# Patient Record
Sex: Female | Born: 1985 | Hispanic: Yes | Marital: Married | Smoking: Never smoker
Health system: Southern US, Community
[De-identification: ages and names within clinical notes are randomized; demographics above are authoritative.]

## PROBLEM LIST (undated history)

## (undated) ENCOUNTER — Inpatient Hospital Stay (HOSPITAL_COMMUNITY): Payer: Self-pay

## (undated) DIAGNOSIS — O139 Gestational [pregnancy-induced] hypertension without significant proteinuria, unspecified trimester: Secondary | ICD-10-CM

## (undated) DIAGNOSIS — N83209 Unspecified ovarian cyst, unspecified side: Secondary | ICD-10-CM

## (undated) DIAGNOSIS — I10 Essential (primary) hypertension: Secondary | ICD-10-CM

## (undated) HISTORY — DX: Gestational (pregnancy-induced) hypertension without significant proteinuria, unspecified trimester: O13.9

## (undated) HISTORY — PX: WISDOM TOOTH EXTRACTION: SHX21

## (undated) HISTORY — PX: CHOLECYSTECTOMY, LAPAROSCOPIC: SHX56

## (undated) HISTORY — PX: NO PAST SURGERIES: SHX2092

---
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
## 2007-05-24 ENCOUNTER — Ambulatory Visit (HOSPITAL_COMMUNITY): Admission: RE | Admit: 2007-05-24 | Discharge: 2007-05-24 | Payer: Self-pay | Admitting: Family Medicine

## 2007-06-13 ENCOUNTER — Ambulatory Visit (HOSPITAL_COMMUNITY): Admission: RE | Admit: 2007-06-13 | Discharge: 2007-06-13 | Payer: Self-pay | Admitting: Obstetrics & Gynecology

## 2007-07-03 ENCOUNTER — Ambulatory Visit (HOSPITAL_COMMUNITY): Admission: RE | Admit: 2007-07-03 | Discharge: 2007-07-03 | Payer: Self-pay | Admitting: Obstetrics & Gynecology

## 2007-07-05 ENCOUNTER — Ambulatory Visit (HOSPITAL_COMMUNITY): Admission: RE | Admit: 2007-07-05 | Discharge: 2007-07-05 | Payer: Self-pay | Admitting: Obstetrics & Gynecology

## 2007-07-15 ENCOUNTER — Ambulatory Visit (HOSPITAL_COMMUNITY): Admission: RE | Admit: 2007-07-15 | Discharge: 2007-07-15 | Payer: Self-pay | Admitting: Obstetrics & Gynecology

## 2007-12-11 ENCOUNTER — Ambulatory Visit: Payer: Self-pay | Admitting: Family Medicine

## 2007-12-11 ENCOUNTER — Inpatient Hospital Stay (HOSPITAL_COMMUNITY): Admission: RE | Admit: 2007-12-11 | Discharge: 2007-12-11 | Payer: Self-pay | Admitting: Family Medicine

## 2007-12-13 ENCOUNTER — Ambulatory Visit: Payer: Self-pay | Admitting: *Deleted

## 2007-12-13 ENCOUNTER — Inpatient Hospital Stay (HOSPITAL_COMMUNITY): Admission: AD | Admit: 2007-12-13 | Discharge: 2007-12-16 | Payer: Self-pay | Admitting: Family Medicine

## 2007-12-14 DIAGNOSIS — O139 Gestational [pregnancy-induced] hypertension without significant proteinuria, unspecified trimester: Secondary | ICD-10-CM

## 2009-07-07 ENCOUNTER — Inpatient Hospital Stay (HOSPITAL_COMMUNITY): Admission: AD | Admit: 2009-07-07 | Discharge: 2009-07-07 | Payer: Self-pay | Admitting: Obstetrics and Gynecology

## 2009-09-15 ENCOUNTER — Ambulatory Visit (HOSPITAL_COMMUNITY): Admission: RE | Admit: 2009-09-15 | Discharge: 2009-09-15 | Payer: Self-pay | Admitting: Obstetrics & Gynecology

## 2009-12-26 ENCOUNTER — Ambulatory Visit: Payer: Self-pay | Admitting: Obstetrics and Gynecology

## 2009-12-26 ENCOUNTER — Inpatient Hospital Stay (HOSPITAL_COMMUNITY): Admission: AD | Admit: 2009-12-26 | Discharge: 2009-12-26 | Payer: Self-pay | Admitting: Obstetrics & Gynecology

## 2010-02-02 ENCOUNTER — Ambulatory Visit: Payer: Self-pay | Admitting: Obstetrics and Gynecology

## 2010-02-02 ENCOUNTER — Inpatient Hospital Stay (HOSPITAL_COMMUNITY): Admission: AD | Admit: 2010-02-02 | Discharge: 2010-02-04 | Payer: Self-pay | Admitting: Obstetrics & Gynecology

## 2010-05-04 ENCOUNTER — Inpatient Hospital Stay (HOSPITAL_COMMUNITY)
Admission: EM | Admit: 2010-05-04 | Discharge: 2010-05-06 | Payer: Self-pay | Source: Home / Self Care | Admitting: Emergency Medicine

## 2010-05-05 ENCOUNTER — Encounter (INDEPENDENT_AMBULATORY_CARE_PROVIDER_SITE_OTHER): Payer: Self-pay

## 2010-09-13 LAB — CBC
Hemoglobin: 13.5 g/dL (ref 12.0–15.0)
MCH: 29.7 pg (ref 26.0–34.0)
MCHC: 35.2 g/dL (ref 30.0–36.0)
MCV: 84.4 fL (ref 78.0–100.0)
MCV: 86 fL (ref 78.0–100.0)
Platelets: 200 10*3/uL (ref 150–400)
RBC: 4.2 MIL/uL (ref 3.87–5.11)
RBC: 4.54 MIL/uL (ref 3.87–5.11)
RDW: 13.7 % (ref 11.5–15.5)
WBC: 10.1 10*3/uL (ref 4.0–10.5)

## 2010-09-13 LAB — COMPREHENSIVE METABOLIC PANEL
BUN: 13 mg/dL (ref 6–23)
CO2: 25 mEq/L (ref 19–32)
Chloride: 109 mEq/L (ref 96–112)
Creatinine, Ser: 0.72 mg/dL (ref 0.4–1.2)
GFR calc non Af Amer: >60 mL/min (ref >60)
Total Bilirubin: 1.8 mg/dL — ABNORMAL HIGH (ref 0.3–1.2)

## 2010-09-13 LAB — URINE MICROSCOPIC-ADD ON

## 2010-09-13 LAB — HEPATIC FUNCTION PANEL
AST: 195 U/L — ABNORMAL HIGH (ref 0–37)
Albumin: 3.2 g/dL — ABNORMAL LOW (ref 3.5–5.2)
Alkaline Phosphatase: 116 U/L (ref 39–117)
Bilirubin, Direct: 0.2 mg/dL (ref 0.0–0.3)
Indirect Bilirubin: 0.7 mg/dL (ref 0.3–0.9)
Total Bilirubin: 0.9 mg/dL (ref 0.3–1.2)
Total Protein: 5.7 g/dL — ABNORMAL LOW (ref 6.0–8.3)

## 2010-09-13 LAB — URINALYSIS, ROUTINE W REFLEX MICROSCOPIC
Bilirubin Urine: NEGATIVE
Nitrite: NEGATIVE
Specific Gravity, Urine: 1.021 (ref 1.005–1.030)
Urobilinogen, UA: 1 mg/dL (ref 0.0–1.0)

## 2010-09-13 LAB — DIFFERENTIAL
Basophils Absolute: 0 10*3/uL (ref 0.0–0.1)
Eosinophils Relative: 1 % (ref 0–5)
Lymphocytes Relative: 17 % (ref 12–46)
Neutro Abs: 7.3 10*3/uL (ref 1.7–7.7)
Neutrophils Relative %: 76 % (ref 43–77)

## 2010-09-13 LAB — LIPASE, BLOOD: Lipase: 38 U/L (ref 11–59)

## 2010-09-13 LAB — POCT PREGNANCY, URINE: Preg Test, Ur: NEGATIVE

## 2010-09-16 LAB — RPR: RPR Ser Ql: NONREACTIVE

## 2010-09-16 LAB — CBC
HCT: 40.9 % (ref 36.0–46.0)
MCH: 30.1 pg (ref 26.0–34.0)
MCHC: 33.6 g/dL (ref 30.0–36.0)
Platelets: 210 10*3/uL (ref 150–400)
RBC: 4.1 MIL/uL (ref 3.87–5.11)
RDW: 13.7 % (ref 11.5–15.5)
WBC: 16.7 10*3/uL — ABNORMAL HIGH (ref 4.0–10.5)

## 2010-09-18 LAB — WET PREP, GENITAL
Clue Cells Wet Prep HPF POC: NONE SEEN
Trich, Wet Prep: NONE SEEN
Yeast Wet Prep HPF POC: NONE SEEN

## 2010-09-18 LAB — URINALYSIS, ROUTINE W REFLEX MICROSCOPIC
Ketones, ur: NEGATIVE mg/dL
Nitrite: NEGATIVE
pH: 6.5 (ref 5.0–8.0)

## 2010-09-18 LAB — POCT PREGNANCY, URINE: Preg Test, Ur: POSITIVE

## 2010-09-18 LAB — URINE MICROSCOPIC-ADD ON

## 2010-11-22 ENCOUNTER — Emergency Department (HOSPITAL_COMMUNITY)
Admission: EM | Admit: 2010-11-22 | Discharge: 2010-11-22 | Disposition: A | Discharge status: Home / Self Care | Payer: Self-pay | Attending: Emergency Medicine | Admitting: Emergency Medicine

## 2010-11-22 DIAGNOSIS — R21 Rash and other nonspecific skin eruption: Secondary | ICD-10-CM | POA: Insufficient documentation

## 2011-03-30 LAB — URINALYSIS, ROUTINE W REFLEX MICROSCOPIC
Bilirubin Urine: NEGATIVE
Ketones, ur: NEGATIVE
Nitrite: NEGATIVE
Protein, ur: NEGATIVE
Specific Gravity, Urine: 1.02
Urobilinogen, UA: 0.2

## 2011-03-30 LAB — CBC
MCHC: 35
Platelets: 217
RDW: 12.9

## 2011-03-30 LAB — RPR: RPR Ser Ql: NONREACTIVE

## 2013-05-09 ENCOUNTER — Inpatient Hospital Stay (HOSPITAL_COMMUNITY)
Admission: AD | Admit: 2013-05-09 | Discharge: 2013-05-09 | Disposition: A | Discharge status: Home / Self Care | Payer: Self-pay | Source: Ambulatory Visit | Attending: Family Medicine | Admitting: Family Medicine

## 2013-05-09 ENCOUNTER — Inpatient Hospital Stay (HOSPITAL_COMMUNITY): Payer: Self-pay

## 2013-05-09 ENCOUNTER — Encounter (HOSPITAL_COMMUNITY): Payer: Self-pay | Admitting: *Deleted

## 2013-05-09 DIAGNOSIS — Z975 Presence of (intrauterine) contraceptive device: Secondary | ICD-10-CM | POA: Insufficient documentation

## 2013-05-09 DIAGNOSIS — N946 Dysmenorrhea, unspecified: Secondary | ICD-10-CM | POA: Insufficient documentation

## 2013-05-09 DIAGNOSIS — R1032 Left lower quadrant pain: Secondary | ICD-10-CM | POA: Insufficient documentation

## 2013-05-09 HISTORY — DX: Unspecified ovarian cyst, unspecified side: N83.209

## 2013-05-09 LAB — URINALYSIS, ROUTINE W REFLEX MICROSCOPIC
Ketones, ur: NEGATIVE mg/dL
Leukocytes, UA: NEGATIVE
Nitrite: NEGATIVE
Protein, ur: NEGATIVE mg/dL

## 2013-05-09 LAB — CBC
MCH: 30.3 pg (ref 26.0–34.0)
MCHC: 36.1 g/dL — ABNORMAL HIGH (ref 30.0–36.0)
MCV: 84.1 fL (ref 78.0–100.0)
Platelets: 220 10*3/uL (ref 150–400)

## 2013-05-09 LAB — WET PREP, GENITAL

## 2013-05-09 LAB — HCG, QUANTITATIVE, PREGNANCY: hCG, Beta Chain, Quant, S: <1 m[IU]/mL (ref <5)

## 2013-05-09 MED ORDER — IBUPROFEN 600 MG PO TABS: 600.0000 mg | ORAL_TABLET | Freq: Four times a day (QID) | ORAL | Status: DC | PRN

## 2013-05-09 NOTE — MAU Provider Note (Signed)
History     CSN: [id number]  Arrival date and time: 05/09/13 4098   First Provider Initiated Contact with Patient 05/09/13 1435      Chief Complaint  Patient presents with  . Possible Pregnancy  . Abdominal Pain  . Vaginal Bleeding   Possible Pregnancy Associated symptoms include abdominal pain. Pertinent negatives include no chest pain, chills, fever, headaches, nausea or vomiting.  Abdominal Pain Pertinent negatives include no constipation, diarrhea, dysuria, fever, frequency, headaches, nausea or vomiting.  Vaginal Bleeding Associated symptoms include abdominal pain. Pertinent negatives include no back pain, chills, constipation, diarrhea, dysuria, fever, frequency, headaches, nausea or vomiting.    Ms. Bradly Bienenstock is a 27yo [id number] who presents for LLQ pain and on/bleeding for the past 2 months.  Ms. Bradly Bienenstock speaks Spanish and an interpreter was used for the medical interview.  [facility] and has not had any bleeding with the device prior to 2 months ago. Ms. Bradly Bienenstock took a pregnancy test at home 2 months ago that was (+), after which the bleeding began.  She took a subsequent home pregnancy test that was (-), and has bleeding on and off since. Her LLQ has worsened, prompting her to seek care. She states that her "left ovary is hurting," but she denies ever having any pain like this before or any previous gynecological issues.  She denies any HA, CP, SOB, N/V, diarrhea, constipation, dysuria, fever, chills, LE swelling or pain.  She denies any current vaginal bleeding, but does report some mild vaginal discharge that is white and creamy without odor.  She is unsure how low the discharge has been present.  She denies any exposure to STDs.   OB History   Grav Para Term Preterm Abortions TAB SAB Ect Mult Living   3 2 2  0 1 0 1 0 0 2      Past Medical History  Diagnosis Date  . Ovarian cyst 2004    in [place]    Past Surgical  History  Procedure Laterality Date  . Cholecystectomy, laparoscopic      Family History  Problem Relation Age of Onset  . Diabetes Maternal Grandmother   . Tuberculosis Maternal Grandfather   . Diabetes Paternal Grandmother   . Diabetes Paternal Grandfather     History  Substance Use Topics  . Smoking status: Never Smoker   . Smokeless tobacco: Never Used  . Alcohol Use: No    Allergies: No Known Allergies  No prescriptions prior to admission    Review of Systems  Constitutional: Negative for fever and chills.  Eyes: Negative for blurred vision.  Respiratory: Negative for shortness of breath.   Cardiovascular: Negative for chest pain and leg swelling.  Gastrointestinal: Positive for abdominal pain. Negative for heartburn, nausea, vomiting, diarrhea and constipation.  Genitourinary: Positive for vaginal bleeding. Negative for dysuria and frequency.  Musculoskeletal: Negative for back pain.  Neurological: Negative for headaches.   Physical Exam   Blood pressure 115/70, pulse 76, temperature 98.5 F (36.9 C), temperature source Oral, resp. rate 16, height 4\' 11"  (1.499 m), weight 76.839 kg (169 lb 6.4 oz), SpO2 100.00%.  Physical Exam  Constitutional: She is oriented to person, place, and time. She appears well-developed and well-nourished. No distress.  HENT:  Head: Normocephalic and atraumatic.  Eyes: EOM are normal.  Cardiovascular: Normal rate, regular rhythm, normal heart sounds and intact distal pulses.  Exam reveals no gallop and no friction rub.   No  murmur heard. Respiratory: Effort normal and breath sounds normal. She has no wheezes.  GI: Soft. Bowel sounds are normal. She exhibits no distension and no mass. There is tenderness. There is no rebound and no guarding.  Mild LLQ tenderness to palpation  Genitourinary: Vaginal discharge found.  Pelvic exam: VULVA: normal appearing vulva with no masses, tenderness or lesions, VAGINA: vaginal discharge - white,  creamy and malodorous, CERVIX: cervical discharge present - white, creamy and malodorous, DNA probe for chlamydia and GC obtained, cervical motion tenderness absent, multiparous os, mirena strings present, UTERUS: uterus is normal size, shape, consistency and nontender, ADNEXA: tenderness left, no masses, WET MOUNT done    Musculoskeletal: Normal range of motion.  Neurological: She is alert and oriented to person, place, and time.  Skin: Skin is warm.  Psychiatric: She has a normal mood and affect.   Results for orders placed during the hospital encounter of 05/09/13 (from the past 24 hour(s))  URINALYSIS, ROUTINE W REFLEX MICROSCOPIC     Status: None   Collection Time    05/09/13 10:25 AM      Result Value Range   Color, Urine YELLOW  YELLOW   APPearance CLEAR  CLEAR   Specific Gravity, Urine 1.020  1.005 - 1.030   pH 6.0  5.0 - 8.0   Glucose, UA NEGATIVE  NEGATIVE mg/dL   Hgb urine dipstick NEGATIVE  NEGATIVE   Bilirubin Urine NEGATIVE  NEGATIVE   Ketones, ur NEGATIVE  NEGATIVE mg/dL   Protein, ur NEGATIVE  NEGATIVE mg/dL   Urobilinogen, UA 0.2  0.0 - 1.0 mg/dL   Nitrite NEGATIVE  NEGATIVE   Leukocytes, UA NEGATIVE  NEGATIVE  POCT PREGNANCY, URINE     Status: None   Collection Time    05/09/13 10:32 AM      Result Value Range   Preg Test, Ur NEGATIVE  NEGATIVE  CBC     Status: Abnormal   Collection Time    05/09/13 12:26 PM      Result Value Range   WBC 8.4  4.0 - 10.5 K/uL   RBC 4.78  3.87 - 5.11 MIL/uL   Hemoglobin 14.5  12.0 - 15.0 g/dL   HCT 60.4  54.0 - 98.1 %   MCV 84.1  78.0 - 100.0 fL   MCH 30.3  26.0 - 34.0 pg   MCHC 36.1 (*) 30.0 - 36.0 g/dL   RDW 19.1  47.8 - 29.5 %   Platelets 220  150 - 400 K/uL  HCG, QUANTITATIVE, PREGNANCY     Status: None   Collection Time    05/09/13 12:26 PM      Result Value Range   hCG, Beta Chain, Quant, S <1  <5 mIU/mL    MAU Course  Procedures  MDM -UA, UPT (-) -Spanish interpreter called -H&P; interpreter present -CBC,  hCG quant  -Spanish interpreter called  -Vaginal exam with speculum: wet prep, GC/C collected; Spanish interpreter present -Pelvic and transvaginal U/S   US Transvaginal Non-ob  05/09/2013   CLINICAL DATA:  Left lower quadrant pain  EXAM: TRANSABDOMINAL AND TRANSVAGINAL ULTRASOUND OF PELVIS  TECHNIQUE: Both transabdominal and transvaginal ultrasound examinations of the pelvis were performed. Transabdominal technique was performed for global imaging of the pelvis including uterus, ovaries, adnexal regions, and pelvic cul-de-sac. It was necessary to proceed with endovaginal exam following the transabdominal exam to visualize the endometrium and right ovary.  COMPARISON:  None  FINDINGS: Uterus  Measurements: 7.5 x 3.7 x 4.7 cm. No  fibroids or other mass visualized.  Endometrium  IUD is identified within the uterine cavity.  Right ovary  Measurements: 3.3 x 2.2 x 2.3 cm. Normal appearance/no adnexal mass.  Left ovary  Measurements: 3.8 x 2.0 x 2.2 cm. Normal appearance/no adnexal mass.  Other findings  No free fluid.  IMPRESSION: 1. Normal pelvic sonogram.   Electronically Signed   By: Signa Kell M.D.   On: 05/09/2013 19:12   US Pelvis Complete  05/09/2013   CLINICAL DATA:  Left lower quadrant pain  EXAM: TRANSABDOMINAL AND TRANSVAGINAL ULTRASOUND OF PELVIS  TECHNIQUE: Both transabdominal and transvaginal ultrasound examinations of the pelvis were performed. Transabdominal technique was performed for global imaging of the pelvis including uterus, ovaries, adnexal regions, and pelvic cul-de-sac. It was necessary to proceed with endovaginal exam following the transabdominal exam to visualize the endometrium and right ovary.  COMPARISON:  None  FINDINGS: Uterus  Measurements: 7.5 x 3.7 x 4.7 cm. No fibroids or other mass visualized.  Endometrium  IUD is identified within the uterine cavity.  Right ovary  Measurements: 3.3 x 2.2 x 2.3 cm. Normal appearance/no adnexal mass.  Left ovary  Measurements: 3.8 x 2.0  x 2.2 cm. Normal appearance/no adnexal mass.  Other findings  No free fluid.  IMPRESSION: 1. Normal pelvic sonogram.   Electronically Signed   By: Signa Kell M.D.   On: 05/09/2013 19:12   Assessment and Plan  A: 1. Dysmenorrhea    P: D/C home Discussed negative pregnancy and quant results.  Discussed normal [facility] Spanish translator used for all communication.  Ibuprofen 600 mg PO Q 6 hours PRN Increase PO fluids [facility] Return to [facility]U as needed  Christiana Fuchs 05/09/2013, 4:52 PM   I have seen this patient and agree with the above PA student's note.  LEFTWICH-KIRBY, Dariel Pellecchia Certified Nurse-Midwife

## 2013-05-09 NOTE — MAU Note (Signed)
Pain on left side off and on for 2 months. Crampy.  No bleeding right now.  2 months ago had positive preg test., then she started bleeding.  Did a 2nd test after the bleeding it was neg.  Has had bleeding off and on.

## 2013-05-09 NOTE — MAU Note (Signed)
Patient states she has a Mirena IUD for 5 years. States she had a positive home pregnancy test about 2 weeks ago. Has had bright red bleeding off and on for about 2 weeks, only spotting today. Has had lower abdominal pain off and on for 2 weeks.

## 2013-05-10 LAB — GC/CHLAMYDIA PROBE AMP: GC Probe RNA: NEGATIVE

## 2013-05-10 NOTE — MAU Provider Note (Signed)
Chart reviewed and agree with management and plan.  

## 2014-05-04 ENCOUNTER — Encounter (HOSPITAL_COMMUNITY): Payer: Self-pay | Admitting: *Deleted

## 2014-09-28 ENCOUNTER — Encounter (HOSPITAL_COMMUNITY): Payer: Self-pay | Admitting: *Deleted

## 2014-09-28 ENCOUNTER — Inpatient Hospital Stay (HOSPITAL_COMMUNITY)
Admission: AD | Admit: 2014-09-28 | Discharge: 2014-09-29 | Disposition: A | Discharge status: Home / Self Care | Payer: Self-pay | Source: Ambulatory Visit | Attending: Obstetrics & Gynecology | Admitting: Obstetrics & Gynecology

## 2014-09-28 DIAGNOSIS — Z3A01 Less than 8 weeks gestation of pregnancy: Secondary | ICD-10-CM | POA: Insufficient documentation

## 2014-09-28 DIAGNOSIS — O9989 Other specified diseases and conditions complicating pregnancy, childbirth and the puerperium: Secondary | ICD-10-CM | POA: Insufficient documentation

## 2014-09-28 DIAGNOSIS — M545 Low back pain: Secondary | ICD-10-CM | POA: Insufficient documentation

## 2014-09-28 DIAGNOSIS — O4691 Antepartum hemorrhage, unspecified, first trimester: Secondary | ICD-10-CM | POA: Insufficient documentation

## 2014-09-28 DIAGNOSIS — O209 Hemorrhage in early pregnancy, unspecified: Secondary | ICD-10-CM

## 2014-09-28 LAB — POCT PREGNANCY, URINE: Preg Test, Ur: POSITIVE — AB

## 2014-09-28 NOTE — MAU Note (Signed)
Pt states that bleeding only occurs when wiping after BR. Pt states that she did not wear a pad in. Pt states that bleeding may also have looked " tissue-like". Pt is also having lower backache that is constant.

## 2014-09-28 NOTE — MAU Note (Signed)
[facility], and today she has been having bleeding and back pain.

## 2014-09-29 ENCOUNTER — Inpatient Hospital Stay (HOSPITAL_COMMUNITY): Payer: Self-pay

## 2014-09-29 ENCOUNTER — Inpatient Hospital Stay (HOSPITAL_COMMUNITY)
Admission: AD | Admit: 2014-09-29 | Discharge: 2014-09-29 | Disposition: A | Discharge status: Home / Self Care | Payer: Self-pay | Source: Ambulatory Visit | Attending: Family Medicine | Admitting: Family Medicine

## 2014-09-29 ENCOUNTER — Encounter (HOSPITAL_COMMUNITY): Payer: Self-pay | Admitting: *Deleted

## 2014-09-29 DIAGNOSIS — R1030 Lower abdominal pain, unspecified: Secondary | ICD-10-CM | POA: Insufficient documentation

## 2014-09-29 DIAGNOSIS — Z3A08 8 weeks gestation of pregnancy: Secondary | ICD-10-CM | POA: Insufficient documentation

## 2014-09-29 DIAGNOSIS — O9989 Other specified diseases and conditions complicating pregnancy, childbirth and the puerperium: Secondary | ICD-10-CM | POA: Insufficient documentation

## 2014-09-29 DIAGNOSIS — O209 Hemorrhage in early pregnancy, unspecified: Secondary | ICD-10-CM | POA: Insufficient documentation

## 2014-09-29 DIAGNOSIS — O200 Threatened abortion: Secondary | ICD-10-CM

## 2014-09-29 LAB — URINALYSIS, ROUTINE W REFLEX MICROSCOPIC
Bilirubin Urine: NEGATIVE
GLUCOSE, UA: NEGATIVE mg/dL
Ketones, ur: NEGATIVE mg/dL
Leukocytes, UA: NEGATIVE
NITRITE: NEGATIVE
PH: 6.5 (ref 5.0–8.0)
Protein, ur: NEGATIVE mg/dL
SPECIFIC GRAVITY, URINE: 1.015 (ref 1.005–1.030)
Urobilinogen, UA: 0.2 mg/dL (ref 0.0–1.0)

## 2014-09-29 LAB — CBC WITH DIFFERENTIAL/PLATELET
BASOS ABS: 0 10*3/uL (ref 0.0–0.1)
BASOS PCT: 0 % (ref 0–1)
Basophils Absolute: 0 10*3/uL (ref 0.0–0.1)
Basophils Relative: 0 % (ref 0–1)
EOS ABS: 0.2 10*3/uL (ref 0.0–0.7)
Eosinophils Absolute: 0.3 10*3/uL (ref 0.0–0.7)
Eosinophils Relative: 3 % (ref 0–5)
Eosinophils Relative: 4 % (ref 0–5)
HCT: 38 % (ref 36.0–46.0)
HCT: 39.2 % (ref 36.0–46.0)
HEMOGLOBIN: 13.9 g/dL (ref 12.0–15.0)
HEMOGLOBIN: 13.4 g/dL (ref 12.0–15.0)
Lymphocytes Relative: 24 % (ref 12–46)
Lymphocytes Relative: 37 % (ref 12–46)
Lymphs Abs: 1.7 10*3/uL (ref 0.7–4.0)
Lymphs Abs: 2.8 10*3/uL (ref 0.7–4.0)
MCH: 30.1 pg (ref 26.0–34.0)
MCH: 30.3 pg (ref 26.0–34.0)
MCHC: 35.5 g/dL (ref 30.0–36.0)
MCHC: 35.3 g/dL (ref 30.0–36.0)
MCV: 85.4 fL (ref 78.0–100.0)
MCV: 85.6 fL (ref 78.0–100.0)
MONO ABS: 0.4 10*3/uL (ref 0.1–1.0)
MONO ABS: 0.5 10*3/uL (ref 0.1–1.0)
Monocytes Relative: 6 % (ref 3–12)
Monocytes Relative: 6 % (ref 3–12)
Neutro Abs: 3.8 10*3/uL (ref 1.7–7.7)
Neutro Abs: 4.6 10*3/uL (ref 1.7–7.7)
Neutrophils Relative %: 52 % (ref 43–77)
Neutrophils Relative %: 66 % (ref 43–77)
Platelets: 221 10*3/uL (ref 150–400)
Platelets: 225 10*3/uL (ref 150–400)
RBC: 4.45 MIL/uL (ref 3.87–5.11)
RBC: 4.58 MIL/uL (ref 3.87–5.11)
RDW: 13.3 % (ref 11.5–15.5)
RDW: 13.3 % (ref 11.5–15.5)
WBC: 6.9 10*3/uL (ref 4.0–10.5)
WBC: 7.4 10*3/uL (ref 4.0–10.5)

## 2014-09-29 LAB — WET PREP, GENITAL
Clue Cells Wet Prep HPF POC: NONE SEEN
Trich, Wet Prep: NONE SEEN
Yeast Wet Prep HPF POC: NONE SEEN

## 2014-09-29 LAB — URINE MICROSCOPIC-ADD ON

## 2014-09-29 LAB — ABO/RH: ABO/RH(D): O POS

## 2014-09-29 LAB — HIV ANTIBODY (ROUTINE TESTING W REFLEX): HIV SCREEN 4TH GENERATION: NONREACTIVE

## 2014-09-29 LAB — RPR: RPR Ser Ql: NONREACTIVE

## 2014-09-29 LAB — HCG, QUANTITATIVE, PREGNANCY: hCG, Beta Chain, Quant, S: 1122 m[IU]/mL — ABNORMAL HIGH (ref <5)

## 2014-09-29 MED ORDER — ACETAMINOPHEN 500 MG PO TABS
1000.0000 mg | ORAL_TABLET | Freq: Once | ORAL | Status: AC
  Administered 2014-09-29: 1000 mg via ORAL
  Filled 2014-09-29: qty 2

## 2014-09-29 NOTE — MAU Provider Note (Signed)
CSN: [id number]     Arrival date & time 09/28/14  2240 History   None    No chief complaint on file.    (Consider location/radiation/quality/duration/timing/severity/associated sxs/prior Treatment) Patient is a 29 y.o. female presenting with vaginal bleeding. The history is provided by the patient. The history is limited by a language barrier. A language interpreter was used.  Vaginal Bleeding This is a new problem. The current episode started today. The problem occurs constantly. The problem has been unchanged. Pertinent negatives include no abdominal pain, chills, fever, nausea or vomiting. Nothing aggravates the symptoms. She has tried nothing for the symptoms.   Pamela Alvarado is a 29 y.o. [id number] @ 5252w5d gestation by LMP. [facility]/14.   Today she noted blood when she went to the bathroom and wiped. She has not use a pad. She also reports lower back pain since the bleeding started. She denies abdominal pain.   Past Medical History  Diagnosis Date  . Ovarian cyst 2004    in [place]  . Medical history non-contributory    Past Surgical History  Procedure Laterality Date  . Cholecystectomy, laparoscopic    . No past surgeries     Family History  Problem Relation Age of Onset  . Diabetes Maternal Grandmother   . Tuberculosis Maternal Grandfather   . Diabetes Paternal Grandmother   . Diabetes Paternal Grandfather    History  Substance Use Topics  . Smoking status: Never Smoker   . Smokeless tobacco: Never Used  . Alcohol Use: No   OB History    Gravida Para Term Preterm AB TAB SAB Ectopic Multiple Living   4 2 2  0 1 0 1 0 0 2     Review of Systems  Constitutional: Negative for fever and chills.  Gastrointestinal: Negative for nausea, vomiting and abdominal pain.  Genitourinary: Positive for vaginal bleeding.  all other systems negataive    Allergies  Review of patient's allergies indicates no known  allergies.  Home Medications   Prior to Admission medications   Medication Sig Start Date End Date Taking? Authorizing Provider  Prenatal Vit-Fe Fumarate-FA (MULTIVITAMIN-PRENATAL) 27-0.8 MG TABS tablet Take 1 tablet by mouth daily at 12 noon.   Yes Historical Provider, MD   BP 103/56 mmHg  Pulse 68  Temp(Src) 98.5 F (36.9 C) (Oral)  Resp 18  Ht 4\' 11"  (1.499 m)  Wt 180 lb (81.647 kg)  BMI 36.34 kg/m2  SpO2 100%  LMP 07/30/2014 Physical Exam  Constitutional: She is oriented to person, place, and time. She appears well-developed and well-nourished.  HENT:  Head: Normocephalic.  Eyes: EOM are normal.  Neck: Normal range of motion. Neck supple.  Cardiovascular: Normal rate.   Pulmonary/Chest: Effort normal.  Abdominal: Soft. There is no tenderness.  Genitourinary:  External genitalia without lesions, small blood vaginal vault, cervix closed, no CMT, no adnexal tenderness. Uterus without palpable enlargement.   Musculoskeletal: Normal range of motion.  Neurological: She is alert and oriented to person, place, and time. No cranial nerve deficit.  Skin: Skin is warm and dry.  Psychiatric: She has a normal mood and affect. Her behavior is normal.  Nursing note and vitals reviewed.   ED Course  Procedures (including critical care time) Labs, exam, ultrasound Results for orders placed or performed during the hospital encounter of 09/28/14 (from the past 48 hour(s))  Urinalysis, Routine w reflex microscopic     Status: Abnormal   Collection Time:  09/28/14 11:16 PM  Result Value Ref Range   Color, Urine YELLOW YELLOW   APPearance CLEAR CLEAR   Specific Gravity, Urine 1.015 1.005 - 1.030   pH 6.5 5.0 - 8.0   Glucose, UA NEGATIVE NEGATIVE mg/dL   Hgb urine dipstick LARGE (A) NEGATIVE   Bilirubin Urine NEGATIVE NEGATIVE   Ketones, ur NEGATIVE NEGATIVE mg/dL   Protein, ur NEGATIVE NEGATIVE mg/dL   Urobilinogen, UA 0.2 0.0 - 1.0 mg/dL   Nitrite NEGATIVE NEGATIVE   Leukocytes,  UA NEGATIVE NEGATIVE  Urine microscopic-add on     Status: None   Collection Time: 09/28/14 11:16 PM  Result Value Ref Range   Squamous Epithelial / LPF RARE RARE   WBC, UA 0-2 <3 WBC/hpf   RBC / HPF 7-10 <3 RBC/hpf   Bacteria, UA RARE RARE  CBC with Differential/Platelet     Status: None   Collection Time: 09/28/14 11:35 PM  Result Value Ref Range   WBC 7.4 4.0 - 10.5 K/uL   RBC 4.45 3.87 - 5.11 MIL/uL   Hemoglobin 13.4 12.0 - 15.0 g/dL   HCT 40.9 81.1 - 91.4 %   MCV 85.4 78.0 - 100.0 fL   MCH 30.1 26.0 - 34.0 pg   MCHC 35.3 30.0 - 36.0 g/dL   RDW 78.2 95.6 - 21.3 %   Platelets 221 150 - 400 K/uL   Neutrophils Relative % 52 43 - 77 %   Neutro Abs 3.8 1.7 - 7.7 K/uL   Lymphocytes Relative 37 12 - 46 %   Lymphs Abs 2.8 0.7 - 4.0 K/uL   Monocytes Relative 6 3 - 12 %   Monocytes Absolute 0.5 0.1 - 1.0 K/uL   Eosinophils Relative 4 0 - 5 %   Eosinophils Absolute 0.3 0.0 - 0.7 K/uL   Basophils Relative 0 0 - 1 %   Basophils Absolute 0.0 0.0 - 0.1 K/uL  hCG, quantitative, pregnancy     Status: Abnormal   Collection Time: 09/28/14 11:35 PM  Result Value Ref Range   hCG, Beta Chain, Quant, S 1122 (H) <5 mIU/mL    Comment:          GEST. AGE      CONC.  (mIU/mL)   <=1 WEEK        5 - 50     2 WEEKS       50 - 500     3 WEEKS       100 - 10,000     4 WEEKS     1,000 - 30,000     5 WEEKS     3,500 - 115,000   6-8 WEEKS     12,000 - 270,000    12 WEEKS     15,000 - 220,000        FEMALE AND NON-PREGNANT FEMALE:     LESS THAN 5 mIU/mL   ABO/Rh     Status: None   Collection Time: 09/28/14 11:35 PM  Result Value Ref Range   ABO/RH(D) O POS   RPR     Status: None   Collection Time: 09/28/14 11:35 PM  Result Value Ref Range   RPR Ser Ql Non Reactive Non Reactive    Comment: (NOTE) Performed At: [facility] [address] [place], [place] [id number] Mila Homer MD [id number]   HIV antibody     Status: None   Collection Time: 09/28/14 11:35 PM  Result Value  Ref Range   HIV Screen 4th  Generation wRfx Non Reactive Non Reactive    Comment: (NOTE) Performed At: [facility] [address] [place], [place] [id number] Mila Homer MD WU:[id number]   Pregnancy, urine POC     Status: Abnormal   Collection Time: 09/28/14 11:45 PM  Result Value Ref Range   Preg Test, Ur POSITIVE (A) NEGATIVE    Comment:        THE SENSITIVITY OF THIS METHODOLOGY IS >24 mIU/mL   Wet prep, genital     Status: Abnormal   Collection Time: 09/29/14 12:10 AM  Result Value Ref Range   Yeast Wet Prep HPF POC NONE SEEN NONE SEEN   Trich, Wet Prep NONE SEEN NONE SEEN   Clue Cells Wet Prep HPF POC NONE SEEN NONE SEEN   WBC, Wet Prep HPF POC FEW (A) NONE SEEN    Comment: FEW BACTERIA SEEN     Labs Review No results found for this or any previous visit (from the past 24 hour(s)).  US Ob Comp Less 14 Wks  09/29/2014   CLINICAL DATA:  Acute onset of vaginal bleeding and pelvic cramping. Initial encounter.  EXAM: OBSTETRIC <14 WK [place] AND TRANSVAGINAL OB US  TECHNIQUE: Both transabdominal and transvaginal ultrasound examinations were performed for complete evaluation of the gestation as well as the maternal uterus, adnexal regions, and pelvic cul-de-sac. Transvaginal technique was performed to assess early pregnancy.  COMPARISON:  Pelvic ultrasound performed 05/09/2013  FINDINGS: Intrauterine gestational sac: Visualized/normal in shape.  Yolk sac:  No  Embryo:  No  Cardiac Activity: N/A  MSD: 9.9 mm   5 w   5  d  Maternal uterus/adnexae: No subchorionic hemorrhage is noted. The uterus is unremarkable in appearance.  The ovaries are within normal limits. The right ovary measures 3.4 x 1.9 x 2.8 cm, while the left ovary measures 3.2 x 2.2 x 2.1 cm. No suspicious adnexal masses are seen; there is no evidence for ovarian torsion.  No free fluid is seen within the pelvic cul-de-sac.  IMPRESSION: Single intrauterine gestational sac noted, with a mean sac diameter of 1.0 cm,  corresponding to a gestational age of [redacted] weeks 5 days. This does not match the gestational age by LMP, though it remains too early to establish an estimated date of delivery. No yolk sac or embryo is yet seen, within normal limits. If the patient's quantitative beta HCG continues to trend upward, follow-up pelvic ultrasound could be considered in 2 weeks to assess for an embryo.   Electronically Signed   By: Roanna Raider M.D.   On: 09/29/2014 02:01   US Ob Transvaginal  09/29/2014   CLINICAL DATA:  Acute onset of vaginal bleeding and pelvic cramping. Initial encounter.  EXAM: OBSTETRIC <14 WK [place] AND TRANSVAGINAL OB US  TECHNIQUE: Both transabdominal and transvaginal ultrasound examinations were performed for complete evaluation of the gestation as well as the maternal uterus, adnexal regions, and pelvic cul-de-sac. Transvaginal technique was performed to assess early pregnancy.  COMPARISON:  Pelvic ultrasound performed 05/09/2013  FINDINGS: Intrauterine gestational sac: Visualized/normal in shape.  Yolk sac:  No  Embryo:  No  Cardiac Activity: N/A  MSD: 9.9 mm   5 w   5  d  Maternal uterus/adnexae: No subchorionic hemorrhage is noted. The uterus is unremarkable in appearance.  The ovaries are within normal limits. The right ovary measures 3.4 x 1.9 x 2.8 cm, while the left ovary measures 3.2 x 2.2 x 2.1 cm. No suspicious adnexal masses are seen; there  is no evidence for ovarian torsion.  No free fluid is seen within the pelvic cul-de-sac.  IMPRESSION: Single intrauterine gestational sac noted, with a mean sac diameter of 1.0 cm, corresponding to a gestational age of [redacted] weeks 5 days. This does not match the gestational age by LMP, though it remains too early to establish an estimated date of delivery. No yolk sac or embryo is yet seen, within normal limits. If the patient's quantitative beta HCG continues to trend upward, follow-up pelvic ultrasound could be considered in 2 weeks to assess for an embryo.    Electronically Signed   By: Roanna Raider M.D.   On: 09/29/2014 02:01     MDM  29 y.o. female with bleeding in first trimester pregnancy. Stable for d/c without hemorrhage or anemia. Patient would be 8 weeks 2 days gestation by LMP, however, Bhcg only 1122 and ultrasound measures 5 week 5 day IUGS without YS.  Will have patient to return in 2 days for f/u Bhcg or sooner for problems. Instructions on bleeding in early pregnancy.  Discussed with the patient using the Spanish translator clinical, lab and ultrasound findings and plan of care. All questioned fully answered. She will return in 2 days or sooner for problems.    Final diagnoses:  Vaginal bleeding in pregnancy, first trimester

## 2014-09-29 NOTE — MAU Note (Signed)
Increase in pain and bleeding

## 2014-09-29 NOTE — Discharge Instructions (Signed)
Amenaza de aborto °(Threatened Miscarriage) °La amenaza de aborto se produce cuando hay hemorragia vaginal durante las primeras 20 semanas de embarazo, pero el embarazo no se interrumpe. El médico le hará pruebas para asegurarse de que el embarazo continúe. La causa de la hemorragia puede ser desconocida. Este trastorno no significa que el embarazo terminará. Sin embargo, aumenta el riesgo de que el embarazo se interrumpa (aborto completo). °CUIDADOS EN EL HOGAR  °· Asegúrese de asistir a todas las citas de cuidados prenatales con el médico. °· Descanse lo suficiente. °· No tenga relaciones sexuales ni use tampones si tiene hemorragia vaginal. °· No se haga duchas vaginales. °· No fume ni consuma drogas. °· No beba alcohol. °· Evite la cafeína. °SOLICITE AYUDA SI: °· Tiene una hemorragia leve de la vagina. °· Tiene dolor o cólicos abdominales. °· Tiene fiebre. °SOLICITE AYUDA DE INMEDIATO SI:  °· Tiene hemorragia abundante de la vagina. °· Elimina coágulos de sangre por la vagina. °· Tiene mucho dolor en el abdomen o la parte baja de la espalda, cólicos abdominales o calambres en la parte baja de la espalda. °· Tiene fiebre, escalofríos y mucho dolor abdominal. °ASEGÚRESE DE QUE:  °· Comprende estas instrucciones. °· Controlará su afección. °· Recibirá ayuda de inmediato si no mejora o si empeora. °Document Released: 07/22/2010 Document Revised: 06/24/2013 °[employer]® Patient Information ©2015 [employer]. This information is not intended to replace advice given to you by your health care provider. Make sure you discuss any questions you have with your health care provider. ° °

## 2014-09-29 NOTE — Discharge Instructions (Signed)
Return here in 2 days to repeat the pregnancy hormone level. Return sooner for problems.

## 2014-09-29 NOTE — MAU Note (Signed)
Pt seen last night with cramping and bleeding; pt returned due to increased pain and bleeding today; rates pain @ 7; having moderate bleeding; had u/s last night;

## 2014-09-29 NOTE — MAU Provider Note (Signed)
History     CSN: [id number]  Arrival date and time: 09/29/14 4098   First Provider Initiated Contact with Patient 09/29/14 1017      Chief Complaint  Patient presents with  . Vaginal Bleeding  . Abdominal Pain   HPI  Pamela Alvarado is a 29 y.o. [phone] at 108w5d who presents to MAU today with complaint of worsening lower abdominal pain and vaginal bleeding. The patient was seen earlier today in MAU and had [place] that showed a small IUGS without YS or FP. Quant hCG was 1122. She states that after leaving she had an increase in pain and bleeding that has now started to improve. She rates pain at 5/10 now. She states pain comes and goes. She denies any clots since prior to previous visit. The patient endorses fatigue, but denies weakness or dizziness. She has not taken any pain medication. She states that she required 2 pads in 1 hour at home, but then bleeding slowed significantly.   OB History    Gravida Para Term Preterm AB TAB SAB Ectopic Multiple Living   0 1 0 1 0 0 2      Past Medical History  Diagnosis Date  . Ovarian cyst 2004    in [place]  . Medical history non-contributory     Past Surgical History  Procedure Laterality Date  . Cholecystectomy, laparoscopic    . No past surgeries      Family History  Problem Relation Age of Onset  . Diabetes Maternal Grandmother   . Tuberculosis Maternal Grandfather   . Diabetes Paternal Grandmother   . Diabetes Paternal Grandfather     History  Substance Use Topics  . Smoking status: Never Smoker   . Smokeless tobacco: Never Used  . Alcohol Use: No    Allergies: No Known Allergies  Prescriptions prior to admission  Medication Sig Dispense Refill Last Dose  . Prenatal Vit-Fe Fumarate-FA (MULTIVITAMIN-PRENATAL) 27-0.8 MG TABS tablet Take 1 tablet by mouth daily at 12 noon.       Review of Systems  Constitutional: Positive for malaise/fatigue. Negative for fever.  Gastrointestinal: Positive for  abdominal pain.  Genitourinary:       + vaginal bleeding  Neurological: Negative for dizziness, loss of consciousness and weakness.   Physical Exam   Blood pressure 120/61, pulse 87, temperature 98.5 F (36.9 C), temperature source Oral, resp. rate 18, last menstrual period 07/30/2014.  Physical Exam  Constitutional: She is oriented to person, place, and time. She appears well-developed and well-nourished. No distress.  HENT:  Head: Normocephalic.  Cardiovascular: Normal rate.   Respiratory: Effort normal.  GI: Soft. She exhibits no distension and no mass. There is no tenderness. There is no rebound and no guarding.  Genitourinary:  Very small amount of blood on pad. While in MAU patient passed a small clot in the bathroom. Bleeding has been stable.   Neurological: She is alert and oriented to person, place, and time.  Skin: Skin is warm and dry. No erythema.  Psychiatric: She has a normal mood and affect.   Results for orders placed or performed during the hospital encounter of 09/29/14 (from the past 24 hour(s))  CBC with Differential/Platelet     Status: None   Collection Time: 09/29/14 10:45 AM  Result Value Ref Range   WBC 6.9 4.0 - 10.5 K/uL   RBC 4.58 3.87 - 5.11 MIL/uL   Hemoglobin 13.9 12.0 - 15.0 g/dL   HCT 29.5 62.1 -  46.0 %   MCV 85.6 78.0 - 100.0 fL   MCH 30.3 26.0 - 34.0 pg   MCHC 35.5 30.0 - 36.0 g/dL   RDW 16.113.3 09.611.5 - 04.515.5 %   Platelets 225 150 - 400 K/uL   Neutrophils Relative % 66 43 - 77 %   Neutro Abs 4.6 1.7 - 7.7 K/uL   Lymphocytes Relative 24 12 - 46 %   Lymphs Abs 1.7 0.7 - 4.0 K/uL   Monocytes Relative 6 3 - 12 %   Monocytes Absolute 0.4 0.1 - 1.0 K/uL   Eosinophils Relative 3 0 - 5 %   Eosinophils Absolute 0.2 0.0 - 0.7 K/uL   Basophils Relative 0 0 - 1 %   Basophils Absolute 0.0 0.0 - 0.1 K/uL    MAU Course  Procedures None  MDM CBC today Patient is hemodynamically stable. Bleeding and pain are improving.  Due to amount of bleeding and  dating discrepancies patient advised about concern for SAB. Will need to wait 48 hours for next hCG level to confirm.  Assessment and Plan  A: IUGS without YS or FP Vaginal bleeding in pregnancy  P: Discharge home Bleeding precautions discussed Patient advised to take Tylenol PRN for pain Patient to return to MAU in 48 hours for follow-up labs or sooner if symptoms worsen  Marny LowensteinJulie N Enijah Furr, PA-C  09/29/2014, 12:17 PM

## 2014-09-30 LAB — GC/CHLAMYDIA PROBE AMP (~~LOC~~) NOT AT ARMC
Chlamydia: NEGATIVE
NEISSERIA GONORRHEA: NEGATIVE

## 2014-10-01 ENCOUNTER — Inpatient Hospital Stay (HOSPITAL_COMMUNITY)
Admission: AD | Admit: 2014-10-01 | Discharge: 2014-10-01 | Disposition: A | Discharge status: Home / Self Care | Payer: Self-pay | Source: Ambulatory Visit | Attending: Obstetrics & Gynecology | Admitting: Obstetrics & Gynecology

## 2014-10-01 DIAGNOSIS — O020 Blighted ovum and nonhydatidiform mole: Secondary | ICD-10-CM | POA: Insufficient documentation

## 2014-10-01 LAB — HCG, QUANTITATIVE, PREGNANCY: hCG, Beta Chain, Quant, S: 88 m[IU]/mL — ABNORMAL HIGH (ref <5)

## 2014-10-01 NOTE — MAU Provider Note (Signed)
History     CSN: [id number]  Arrival date and time: 10/01/14 1224   None     Chief Complaint  Patient presents with  . Follow-up   HPI Pt here for f/u HCG Seen on 09/28/2014 for bleeding in pregnancy HCG 1122 US showed IUGS 9.9 mm- no YS, no embryo- no suspicious adnexal masses Pt is having a small amount of bleeding today Pt has googled this and is aware she has probably had a miscarriage RN note: RN Registered Nurse Signed  MAU Note 10/01/2014 12:58 PM    Expand All Collapse All   Pt here for F/U BHCG. Pt states she is continuing to have bleeding but less than before. Denies pain.       Past Medical History  Diagnosis Date  . Ovarian cyst 2004    in [place]  . Medical history non-contributory     Past Surgical History  Procedure Laterality Date  . Cholecystectomy, laparoscopic    . No past surgeries      Family History  Problem Relation Age of Onset  . Diabetes Maternal Grandmother   . Tuberculosis Maternal Grandfather   . Diabetes Paternal Grandmother   . Diabetes Paternal Grandfather     History  Substance Use Topics  . Smoking status: Never Smoker   . Smokeless tobacco: Never Used  . Alcohol Use: No    Allergies: No Known Allergies  Prescriptions prior to admission  Medication Sig Dispense Refill Last Dose  . Prenatal Vit-Fe Fumarate-FA (MULTIVITAMIN-PRENATAL) 27-0.8 MG TABS tablet Take 1 tablet by mouth daily at 12 noon.   09/30/2014 at Unknown time    Review of Systems  Constitutional: Negative for fever and chills.  Gastrointestinal: Negative for nausea, vomiting and abdominal pain.  Genitourinary: Negative for dysuria.   Physical Exam   Blood pressure 113/60, pulse 78, resp. rate 20, last menstrual period 07/30/2014, SpO2 99 %.  Physical Exam  Vitals reviewed. Constitutional: She is oriented to person, place, and time. She appears well-developed and well-nourished. No distress.  HENT:  Head: Normocephalic.  Eyes: Pupils are equal,  round, and reactive to light.  Neck: Normal range of motion.  Cardiovascular: Normal rate.   Respiratory: Effort normal.  GI: Soft.  Musculoskeletal: Normal range of motion.  Neurological: She is alert and oriented to person, place, and time.  Skin: Skin is warm and dry.  Psychiatric: She has a normal mood and affect.    MAU Course  Procedures Results for orders placed or performed during the hospital encounter of 10/01/14 (from the past 48 hour(s))  hCG, quantitative, pregnancy     Status: Abnormal   Collection Time: 10/01/14 12:45 PM  Result Value Ref Range   hCG, Beta Chain, Quant, S 88 (H) <5 mIU/mL    Comment:          GEST. AGE      CONC.  (mIU/mL)   <=1 WEEK        5 - 50     2 WEEKS       50 - 500     3 WEEKS       100 - 10,000     4 WEEKS     1,000 - 30,000     5 WEEKS     3,500 - 115,000   6-8 WEEKS     12,000 - 270,000    12 WEEKS     15,000 - 220,000        FEMALE AND NON-PREGNANT FEMALE:  LESS THAN 5 mIU/mL    Level has dropped from 1122 to 88 today  Assessment and Plan  Anembryonic pregnancy- return on 10/08/2014 for repeat HCG to clinic- pt in yellow book Return sooner if increase in pain or heavy bleeding  Jinna Weinman 10/01/2014, 1:19 PM

## 2014-10-01 NOTE — Discharge Instructions (Signed)
Blighted Ovum A blighted ovum (anembryonic pregnancy) happens when a fertilized egg (embryo) attaches itself to the uterine wall, but the embryo does not develop. The pregnancy sac (placenta) continues to grow even though the embryo does not grow and develop.The pregnancy hormone is still secreted because the placenta has formed. This will result in a positive pregnancy test despite having an abnormal pregnancy. A blighted ovum occurs within the first trimester, sometimes before a woman knows she is pregnant.  CAUSES A blighted ovum is usually the result of chromosomal problems. This can be caused by abnormal cell division or poor quality sperm or egg. SYMPTOMS Early on, signs of pregnancy may be experienced, such as:  A missed menstrual period.  Fatigue.  Feeling sick to your stomach (nauseous).  Sore breasts.  A positive pregnancy test. Then, signs of miscarriage may develop, such as:  Abdominal cramps.  Vaginal bleeding or spotting.  A menstrual period that is heavier than usual. DIAGNOSIS The diagnosis of a blighted ovum is made with an ultrasound test that shows an empty uterus or an empty gestational sac. TREATMENT Your caregiver will help you decide what the best treatment is for you. Treatment for a blighted ovum includes:   Letting your body naturally pass the tissue of a blighted ovum.  Taking medicine to trigger the miscarriage.  Having a procedure called a dilation and curettage (D&C) to remove the placental tissues.  A D&C may be helpful if you would like the tissue examined to determine the reason for a miscarriage. Talk to your caregiver about the risks involved with this procedure. HOME CARE INSTRUCTIONS   Follow up with your caregiver to make sure that your pregnancy hormone returns to zero.  Wait at least 1 to 3 regular menstrual cycles before trying to get pregnant again, or as recommended by your caregiver. SEEK IMMEDIATE MEDICAL CARE IF:  You have  worsening abdominal pain.  You have very heavy bleeding or use 1 to 2 pads every hour, for more than 2 hours.  You are dizzy, feel faint, or pass out. Document Released: 10/04/2010 Document Revised: 09/11/2011 Document Reviewed: 10/04/2010 ExitCare Patient Information 2015 [employer]. This information is not intended to replace advice given to you by your health care provider. Make sure you discuss any questions you have with your health care provider.  

## 2014-10-01 NOTE — MAU Note (Signed)
Pt here for F/U BHCG.  Pt states she is continuing to have bleeding but less than before.  Denies pain.

## 2014-10-08 ENCOUNTER — Other Ambulatory Visit: Payer: Self-pay

## 2014-10-08 DIAGNOSIS — O039 Complete or unspecified spontaneous abortion without complication: Secondary | ICD-10-CM

## 2014-10-08 LAB — HCG, QUANTITATIVE, PREGNANCY: HCG, BETA CHAIN, QUANT, S: 3 m[IU]/mL

## 2014-10-08 NOTE — Progress Notes (Unsigned)
Kim, from [facility], called and informed me that stat beta results is a 3.  Per Dr. Marice Potterove, pt has miscarried and she just needs to f/u for contraception.  Called pt with Spanish interpreter Byrd HesselbachMaria and informed pt the results of beta, she has miscarried, and that the provider would like for her to make an appt to discuss birth control.  Pt stated understanding with no further questions.

## 2014-10-26 ENCOUNTER — Ambulatory Visit: Payer: Self-pay | Admitting: Obstetrics & Gynecology

## 2014-10-28 ENCOUNTER — Ambulatory Visit (INDEPENDENT_AMBULATORY_CARE_PROVIDER_SITE_OTHER): Payer: Self-pay | Admitting: Obstetrics & Gynecology

## 2014-10-28 ENCOUNTER — Encounter: Payer: Self-pay | Admitting: Obstetrics & Gynecology

## 2014-10-28 VITALS — BP 115/58 | HR 61 | Temp 98.2°F | Ht 59.0 in | Wt 171.0 lb

## 2014-10-28 DIAGNOSIS — Z30011 Encounter for initial prescription of contraceptive pills: Secondary | ICD-10-CM

## 2014-10-28 MED ORDER — NORGESTIMATE-ETH ESTRADIOL 0.25-35 MG-MCG PO TABS: 1.0000 | ORAL_TABLET | Freq: Every day | ORAL | Status: DC

## 2014-10-28 NOTE — Progress Notes (Signed)
Subjective:     Patient ID: Pamela Alvarado, female   DOB: [birth year], 29 y.o.   MRN: [id number]  HPI Pt presents today for f/u of SAB.  She was seen in the MAU 3/29/ and 3/31 for bleeding. She reports that after the initial bleed she had bleeding with pain.  She reports that after her last visit she bled for 1 week and since that time she has had no further bleeding or pain. She is now interested in contraception.  She would like to have the Mirena.  She had a LnIUD prev but, had it taken out after 4 years.  She also reports that she has taken OCP's in the past with no problems. She denies contraindications to OCPs.   Review of Systems     Objective:   Physical Exam BP 115/58 mmHg  Pulse 61  Temp(Src) 98.2 F (36.8 C)  Ht 4\' 11"  (1.499 m)  Wt 171 lb (77.565 kg)  BMI 34.52 kg/m2  LMP 07/30/2014 Exam deferred  09/29/2014 CLINICAL DATA: Acute onset of vaginal bleeding and pelvic cramping. Initial encounter.  EXAM: OBSTETRIC <14 WK US AND TRANSVAGINAL OB US  TECHNIQUE: Both transabdominal and transvaginal ultrasound examinations were performed for complete evaluation of the gestation as well as the maternal uterus, adnexal regions, and pelvic cul-de-sac. Transvaginal technique was performed to assess early pregnancy.  COMPARISON: Pelvic ultrasound performed 05/09/2013  FINDINGS: Intrauterine gestational sac: Visualized/normal in shape.  Yolk sac: No  Embryo: No  Cardiac Activity: N/A  MSD: 9.9 mm 5 w 5 d  Maternal uterus/adnexae: No subchorionic hemorrhage is noted. The uterus is unremarkable in appearance.  The ovaries are within normal limits. The right ovary measures 3.4 x 1.9 x 2.8 cm, while the left ovary measures 3.2 x 2.2 x 2.1 cm. No suspicious adnexal masses are seen; there is no evidence for ovarian torsion.  No free fluid is seen within the pelvic cul-de-sac.  IMPRESSION: Single intrauterine gestational sac noted, with a  mean sac diameter of 1.0 cm, corresponding to a gestational age of [redacted] weeks 5 days. This does not match the gestational age by LMP, though it remains too early to establish an estimated date of delivery. No yolk sac or embryo is yet seen, within normal limits. If the patient's quantitative beta HCG continues to trend upward, follow-up pelvic ultrasound could be considered in 2 weeks to assess for an embryo.     Assessment:     SAB- sx improved.  No further management. Contraception management- pt will take OCPs until she can [facility]        Plan:     Sprintec 1 po q day Pt to get appt at the [facility] to have a LnIUD placed. Recommend condom use for the next 2 weeks as back up.   Spanish interpreter used for entire conversation 15 min total spent with pt in face to face conversation

## 2014-10-28 NOTE — Progress Notes (Signed)
Patient ID: Pamela Alvarado, female   DOB: [birth year], 29 y.o.   MRN: [id number] Spanish Interpreter Alviris Almonte  Pt states she continues to have spotting and heavy feeling in vagina.

## 2014-10-28 NOTE — Patient Instructions (Signed)
Informacin sobre los anticonceptivos orales (Oral Contraception Information) Los anticonceptivos orales (ACO) son medicamentos que se utilizan para evitar el embarazo. Su funcin es evitar que los ovarios liberen vulos. Las hormonas de los ACO tambin hacen que el moco cervical se haga ms espeso, lo que evita que el esperma ingrese al tero. Tambin hacen que la membrana que recubre internamente al tero se vuelva ms fina, lo que no permite que el huevo fertilizado se adhiera a la pared del tero. Los ACO son muy efectivos cuando se toman exactamente como se prescriben. Sin embargo, no previenen contra las enfermedades de transmisin sexual (ETS). La prctica del sexo seguro, como el uso de preservativos, junto con la pldora, ayudan a prevenir ese tipo de enfermedades.  Antes de tomar la pldora, usted debe hacerse un examen fsico y un test de Pap. El mdico podr indicarle anlisis de sangre, si es necesario. El mdico se asegurar de que usted sea una buena candidata para usar anticonceptivos orales. Converse con su mdico acerca de los posibles efectos secundarios de los ACO que podran recetarle. Cuando se inicia el uso de ACO, puede llevar 2 a 3 meses para que su organismo se adapte a los cambios en los niveles hormonales.  TIPOS DE ANTICONCEPTIVOS ORALES  Pldora combinada: esta pldora contiene las hormonas estrgeno y progestina (progesterona sinttica). La pldora combinada viene en envases para 21 das, 28 das o 91 das. Algunos tipos de pldoras combinadas deben tomarse de manera continua (pldoras para 365 das). En los envases para 21 das, usted no tomar las pldoras durante 7 das despus de la ltima pldora. En los envases para 28 das, la pldora se toma todos los das. Las ltimas 7 no contienen hormonas. Ciertos tipos de pldoras tienen ms de 21 pldoras que contienen hormonas. En los envases para 91 das, las primeras 84 pldoras contienen ambas hormonas y las ltimas 7 pldoras  no contienen hormonas o contienen slo estrgenos.  La minipldora: esta pldora contiene la hormona progesterona solamente. Es necesario tomarla todos los das de manera continua. Es importante que las tome a la misma hora todos los das. Viene en envases de 28 pldoras. Las 28 pldoras contienen la hormona.  VENTAJAS DE LOS ANTICONCEPTIVOS ORALES  Disminuye los sntomas premenstruales.   Se usa para tratar los clicos menstruales.   Regula el ciclo menstrual.   Disminuye el ciclo menstrual abundante.   Puede mejorar el acn, segn el tipo de pldora.   Trata hemorragias uterinas anormales.   Trata el sndrome ovrico poliqustico.   Trata la endometriosis.   Pueden usarse como anticonceptivo de emergencia.  FACTORES QUE PUEDEN HACER QUE LOS ANTICONCEPTIVOS ORALES SEAN MENOS EFECTIVOS Pueden ser menos efectivos si:   Olvid tomar la pldora todos los das a la misma hora.   Tiene una enfermedad estomacal o intestinal que disminuye la absorcin de la pldora.   Ingiere simultneamente los anticonceptivos orales junto con otros medicamentos que los hacen menos efectivos, como antibiticos, ciertos medicamentos para el VIH y algunos medicamentos para las convulsiones.   Usted toma anticonceptivos orales que han vencido.   Cuando se usa el envase de 21 das, se olvida de recomenzar el uso en el da 7.  RIESGOS ASOCIADOS AL USO DE ANTICONCEPTIVOS ORALES  Los anticonceptivos orales pueden en algunos casos causar efectos secundarios como:  Dolor de cabeza.  Nuseas.  Inflamacin mamaria.  Hemorragia vaginal o manchado irregular. Las pldoras combinadas tambin se asocian a un pequeo aumento en el riesgo de:  Cogulos   sanguneos.  Ataque cardaco.  Ictus. Document Released: 03/29/2005 Document Revised: 04/09/2013 ExitCare Patient Information 2015 [employer]. This information is not intended to replace advice given to you by your health care provider.  Make sure you discuss any questions you have with your health care provider.  

## 2015-02-15 ENCOUNTER — Other Ambulatory Visit (HOSPITAL_COMMUNITY): Payer: Self-pay | Admitting: Nurse Practitioner

## 2015-02-15 DIAGNOSIS — Z3A13 13 weeks gestation of pregnancy: Secondary | ICD-10-CM

## 2015-02-15 DIAGNOSIS — Z3A19 19 weeks gestation of pregnancy: Secondary | ICD-10-CM

## 2015-02-15 DIAGNOSIS — Z3682 Encounter for antenatal screening for nuchal translucency: Secondary | ICD-10-CM

## 2015-02-15 DIAGNOSIS — Z3689 Encounter for other specified antenatal screening: Secondary | ICD-10-CM

## 2015-02-15 LAB — OB RESULTS CONSOLE HIV ANTIBODY (ROUTINE TESTING): HIV: NONREACTIVE

## 2015-02-15 LAB — OB RESULTS CONSOLE RPR: RPR: NONREACTIVE

## 2015-02-15 LAB — OB RESULTS CONSOLE GC/CHLAMYDIA
Chlamydia: NEGATIVE
Gonorrhea: NEGATIVE

## 2015-02-15 LAB — OB RESULTS CONSOLE ANTIBODY SCREEN: ANTIBODY SCREEN: NEGATIVE

## 2015-02-15 LAB — OB RESULTS CONSOLE RUBELLA ANTIBODY, IGM: Rubella: IMMUNE

## 2015-02-15 LAB — OB RESULTS CONSOLE HEPATITIS B SURFACE ANTIGEN: Hepatitis B Surface Ag: NEGATIVE

## 2015-03-03 ENCOUNTER — Other Ambulatory Visit (HOSPITAL_COMMUNITY): Payer: Self-pay | Admitting: Nurse Practitioner

## 2015-03-03 ENCOUNTER — Encounter (HOSPITAL_COMMUNITY): Payer: Self-pay

## 2015-03-03 ENCOUNTER — Ambulatory Visit (HOSPITAL_COMMUNITY): Admission: RE | Admit: 2015-03-03 | Payer: Self-pay | Source: Ambulatory Visit

## 2015-03-03 ENCOUNTER — Ambulatory Visit (HOSPITAL_COMMUNITY)
Admission: RE | Admit: 2015-03-03 | Discharge: 2015-03-03 | Disposition: A | Discharge status: Home / Self Care | Payer: Self-pay | Source: Ambulatory Visit | Attending: Nurse Practitioner | Admitting: Nurse Practitioner

## 2015-03-03 DIAGNOSIS — Z3A11 11 weeks gestation of pregnancy: Secondary | ICD-10-CM | POA: Insufficient documentation

## 2015-03-03 DIAGNOSIS — Z3682 Encounter for antenatal screening for nuchal translucency: Secondary | ICD-10-CM

## 2015-03-03 DIAGNOSIS — Z3687 Encounter for antenatal screening for uncertain dates: Secondary | ICD-10-CM

## 2015-03-03 DIAGNOSIS — Z36 Encounter for antenatal screening of mother: Secondary | ICD-10-CM | POA: Insufficient documentation

## 2015-03-03 DIAGNOSIS — Z3A13 13 weeks gestation of pregnancy: Secondary | ICD-10-CM

## 2015-03-17 ENCOUNTER — Ambulatory Visit (HOSPITAL_COMMUNITY)
Admission: RE | Admit: 2015-03-17 | Discharge: 2015-03-17 | Disposition: A | Discharge status: Home / Self Care | Payer: Self-pay | Source: Ambulatory Visit | Attending: Nurse Practitioner | Admitting: Nurse Practitioner

## 2015-03-17 ENCOUNTER — Encounter (HOSPITAL_COMMUNITY): Payer: Self-pay

## 2015-03-17 ENCOUNTER — Other Ambulatory Visit (HOSPITAL_COMMUNITY): Payer: Self-pay | Admitting: Obstetrics and Gynecology

## 2015-03-17 DIAGNOSIS — Z3A13 13 weeks gestation of pregnancy: Secondary | ICD-10-CM

## 2015-03-17 DIAGNOSIS — Z3682 Encounter for antenatal screening for nuchal translucency: Secondary | ICD-10-CM

## 2015-03-17 DIAGNOSIS — Z36 Encounter for antenatal screening of mother: Secondary | ICD-10-CM | POA: Insufficient documentation

## 2015-03-23 ENCOUNTER — Other Ambulatory Visit (HOSPITAL_COMMUNITY): Payer: Self-pay | Admitting: Nurse Practitioner

## 2015-04-12 ENCOUNTER — Ambulatory Visit (HOSPITAL_COMMUNITY): Payer: Self-pay

## 2015-04-26 ENCOUNTER — Ambulatory Visit (HOSPITAL_COMMUNITY)
Admission: RE | Admit: 2015-04-26 | Discharge: 2015-04-26 | Disposition: A | Discharge status: Home / Self Care | Payer: Self-pay | Source: Ambulatory Visit | Attending: Nurse Practitioner | Admitting: Nurse Practitioner

## 2015-04-26 ENCOUNTER — Other Ambulatory Visit (HOSPITAL_COMMUNITY): Payer: Self-pay | Admitting: Nurse Practitioner

## 2015-04-26 DIAGNOSIS — Z36 Encounter for antenatal screening of mother: Secondary | ICD-10-CM | POA: Insufficient documentation

## 2015-04-26 DIAGNOSIS — Z3689 Encounter for other specified antenatal screening: Secondary | ICD-10-CM

## 2015-04-26 DIAGNOSIS — Z3A19 19 weeks gestation of pregnancy: Secondary | ICD-10-CM | POA: Insufficient documentation

## 2015-04-30 ENCOUNTER — Encounter (HOSPITAL_COMMUNITY): Payer: Self-pay

## 2015-04-30 ENCOUNTER — Inpatient Hospital Stay (HOSPITAL_COMMUNITY)
Admission: AD | Admit: 2015-04-30 | Discharge: 2015-05-01 | Disposition: A | Discharge status: Home / Self Care | Payer: Self-pay | Source: Ambulatory Visit | Attending: Obstetrics and Gynecology | Admitting: Obstetrics and Gynecology

## 2015-04-30 DIAGNOSIS — Z3A19 19 weeks gestation of pregnancy: Secondary | ICD-10-CM | POA: Insufficient documentation

## 2015-04-30 DIAGNOSIS — R109 Unspecified abdominal pain: Secondary | ICD-10-CM | POA: Insufficient documentation

## 2015-04-30 DIAGNOSIS — O2342 Unspecified infection of urinary tract in pregnancy, second trimester: Secondary | ICD-10-CM | POA: Insufficient documentation

## 2015-04-30 HISTORY — DX: Essential (primary) hypertension: I10

## 2015-04-30 LAB — URINALYSIS, ROUTINE W REFLEX MICROSCOPIC
Bilirubin Urine: NEGATIVE
GLUCOSE, UA: NEGATIVE mg/dL
Ketones, ur: NEGATIVE mg/dL
Leukocytes, UA: NEGATIVE
Nitrite: POSITIVE — AB
Protein, ur: NEGATIVE mg/dL
SPECIFIC GRAVITY, URINE: 1.02 (ref 1.005–1.030)
Urobilinogen, UA: 0.2 mg/dL (ref 0.0–1.0)
pH: 6 (ref 5.0–8.0)

## 2015-04-30 LAB — URINE MICROSCOPIC-ADD ON

## 2015-04-30 LAB — WET PREP, GENITAL
Trich, Wet Prep: NONE SEEN
YEAST WET PREP: NONE SEEN

## 2015-04-30 MED ORDER — CEPHALEXIN 500 MG PO CAPS: 500.0000 mg | ORAL_CAPSULE | Freq: Three times a day (TID) | ORAL | Status: DC

## 2015-04-30 NOTE — MAU Provider Note (Signed)
History     CSN: [id number]  Arrival date and time: 04/30/15 2117   First Provider Initiated Contact with Patient 04/30/15 2224      Chief Complaint  Patient presents with  . Abdominal Pain  . Back Pain   HPI Pamela Alvarado 29 y.o. [id number]  presents for abdominal pain and burning with urination that began at 4pm today.  The pain is with urination and after.  She does not note any smell.  It is darker than usual.  The abdominal pain is mid-lower over her bladder.  She was carrying a heavy basket of laundry just prior to this.  She denies vaginal bleeding, LOF, contractions.  The baby is moving well.   OB History    Gravida Para Term Preterm AB TAB SAB Ectopic Multiple Living   0 2 0 2 0 0 2      Past Medical History  Diagnosis Date  . Ovarian cyst 2004    in [place]  . Medical history non-contributory   . Hypertension     Past Surgical History  Procedure Laterality Date  . Cholecystectomy, laparoscopic    . No past surgeries      Family History  Problem Relation Age of Onset  . Diabetes Maternal Grandmother   . Tuberculosis Maternal Grandfather   . Diabetes Paternal Grandmother   . Diabetes Paternal Grandfather     Social History  Substance Use Topics  . Smoking status: Never Smoker   . Smokeless tobacco: Never Used  . Alcohol Use: No    Allergies: No Known Allergies  Prescriptions prior to admission  Medication Sig Dispense Refill Last Dose  . Prenatal Vit-Fe Fumarate-FA (PRENATAL VITAMIN PO) Take by mouth.   04/29/2015 at Unknown time  . norgestimate-ethinyl estradiol (ORTHO-CYCLEN,SPRINTEC,PREVIFEM) 0.25-35 MG-MCG tablet Take 1 tablet by mouth daily. (Patient not taking: Reported on 03/03/2015) 1 Package 3 Not Taking    ROS Pertinent ROS in HPI.  All other systems are negative.   Physical Exam   Blood pressure 112/61, temperature 98 F (36.7 C), resp. rate 20, height  (1.499 m), weight 171 lb 9.6 oz (77.837 kg), last  menstrual period 11/29/2014, unknown if currently breastfeeding.  Physical Exam  Constitutional: She is oriented to person, place, and time. She appears well-developed and well-nourished. No distress.  HENT:  Head: Normocephalic and atraumatic.  Eyes: Conjunctivae and EOM are normal.  Cardiovascular: Normal rate and normal heart sounds.   Respiratory: Effort normal. No respiratory distress.  GI: Soft. She exhibits no distension. There is no tenderness. There is no rebound and no guarding.  Genitourinary:  Small amt of thin white discharge Cervix closed.  No CMT.  No adnexal tenderness  Musculoskeletal: Normal range of motion.  Neurological: She is alert and oriented to person, place, and time.  Skin: Skin is warm and dry.  Psychiatric: She has a normal mood and affect.   Results for orders placed or performed during the hospital encounter of 04/30/15 (from the past 24 hour(s))  Urinalysis, Routine w reflex microscopic (not at [facility])     Status: Abnormal   Collection Time: 04/30/15  9:55 PM  Result Value Ref Range   Color, Urine [id number]   APPearance CLEAR CLEAR   Specific Gravity, Urine 1.020 1.005 - 1.030   pH 6.0 5.0 - 8.0   Glucose, UA NEGATIVE NEGATIVE mg/dL   Hgb urine dipstick TRACE (A) NEGATIVE   Bilirubin Urine NEGATIVE NEGATIVE   Ketones, ur NEGATIVE  NEGATIVE mg/dL   Protein, ur NEGATIVE NEGATIVE mg/dL   Urobilinogen, UA 0.2 0.0 - 1.0 mg/dL   Nitrite POSITIVE (A) NEGATIVE   Leukocytes, UA NEGATIVE NEGATIVE  Urine microscopic-add on     Status: Abnormal   Collection Time: 04/30/15  9:55 PM  Result Value Ref Range   Squamous Epithelial / LPF FEW (A) RARE   WBC, UA 3-6 <3 WBC/hpf   RBC / HPF 7-10 <3 RBC/hpf   Bacteria, UA MANY (A) RARE  Wet prep, genital     Status: Abnormal   Collection Time: 04/30/15 10:45 PM  Result Value Ref Range   Yeast Wet Prep HPF POC NONE SEEN NONE SEEN   Trich, Wet Prep NONE SEEN NONE SEEN   Clue Cells Wet Prep HPF POC FEW (A) NONE  SEEN   WBC, Wet Prep HPF POC FEW (A) NONE SEEN     MAU Course  Procedures  MDM Pt with symptoms consistent with UTI and nitrites on u/a.  Good fetal heart tones on triage  Assessment and Plan  A:  1. UTI in pregnancy, second trimester    P: Discharge to home Rx for Keflex Push po fluids OB urine cx pending [facility] may return to [facility] as needed or if her condition were to change or worsen   Bertram DenverKaren E Teague Clark 04/30/2015, 10:25 PM

## 2015-04-30 NOTE — MAU Note (Signed)
Pain in lower back and lower abd since1600. Denies LOF or bleeding. Some pressure when i pee

## 2015-04-30 NOTE — MAU Note (Signed)
Cramps in legs x 1 hour not abdomen area.  Pressure and pain with voiding  Since 7 pm tonight.  Lower back hurting started at 4 pm and got worse at 7 pm.  No bleeding. No leaking fluid.

## 2015-04-30 NOTE — Discharge Instructions (Signed)
Embarazo e infeccin del tracto urinario (Pregnancy and Urinary Tract Infection) Una infeccin urinaria (IU) puede ocurrir en Clinical cytogeneticist del tracto urinario. La infeccin urinaria puede [employer] utteres, los riones (pielonefritis), la vejiga (cistitis) y Geologist, engineering (uretritis). Todas las mujeres embarazadas deben ser estudiadas para diagnosticar la presencia de bacterias en el tracto urinario. La identificacin y el tratamiento de una infeccin urinaria disminuye el riesgo de un parto prematuro y de Actor infecciones ms graves en la madre y el beb. CAUSAS Las bacterias causan casi todas las infecciones urinarias.  FACTORES DE RIESGO Hay muchos factores que pueden aumentar sus probabilidades de contraer una infeccin urinaria (IU) durante el [place]. Pueden ser:  Lucilla Edin uretra corta.  Falta de aseo y malos hbitos de higiene.  [place].  Obstruccin de la orina en el tracto urinario.  Problemas con los msculos o nervios plvicos.  Diabetes.  Obesidad.  Problemas en la vejiga despus de tener varios hijos.  Antecedentes de infeccin urinaria. SIGNOS Y SNTOMAS   Dolor, ardor o sensacin de ardor al [employer].  Sentir la necesidad de Garment/textile technologist de inmediato [place]).  Prdida del control vesical (incontinencia urinaria).  Orinar con ms frecuencia de lo comn en el embarazo.  Malestar en la zona inferior del abdomen o en la espalda.  Bennie Hind turbia.  Sangre en la orina (hematuria).  Cristy Hilts. Cuando se infectan los riones, los sntomas pueden ser:  Dolor de espalda.  Dolor lateral en el lado derecho ms que en el lado izquierdo.  Cristy Hilts.  Escalofros.  Nuseas.  Vmitos. DIAGNSTICO  Una infeccin del tracto urinario se suele diagnosticar a travs de la orina. A veces se realizan pruebas y procedimientos adicionales. Estos pueden ser:  Steward Drone de los riones, los urteres, la vejiga y Geologist, engineering.  Observar la vejiga con un  tubo que ilumina (cistoscopa). TRATAMIENTO Por lo general, las IU pueden tratarse con medicamentos antibiticos.  INSTRUCCIONES PARA EL CUIDADO EN EL HOGAR   Tome slo medicamentos de venta libre o recetados, segn las indicaciones del mdico. Si le han recetado antibiticos, tmelos segn las indicaciones. Tmelos todos, aunque se sienta mejor.  Beba suficiente lquido para Consulting civil engineer orina clara o de color amarillo plido.  No tenga relaciones sexuales hasta que la infeccin haya desaparecido o el mdico la autorice.  Asegrese de [place] hagan estudios para Hydrographic surveyor una infeccin urinaria durante el [place]. Estas infecciones suelen reaparecer. Para prevenir una infeccin urinaria en el futuro  Practique buenos hbitos higinicos. Siempre debe limpiarse desde adelante hacia atrs. Use el tissue slo una vez.  No retenga la orina. Orine tan pronto como sea posible cuando tenga ganas.  No se haga duchas vaginales ni use desodorantes en aerosol.  Lave con agua tibia y jabn alrededor de la zona genital y el ano.  Vace la vejiga antes y despus de Clinical biochemist.  Use ropa interior con algodn en la entrepierna.  Evite la cafena y las bebidas gaseosas. Estas sustancias irritan la vejiga.  Beba jugo de arndanos o tome comprimidos de arndano. Esto puede disminuir el riesgo de sufrir una infeccin urinaria.  No beba alcohol.  Cumpla con las visitas de control y hgase todos los anlisis segn lo programado. SOLICITE ATENCIN MDICA SI:   Los sntomas empeoran.  Tiene fiebre an despus de 2 das [place].  Tiene una erupcin.  Siente que usted tiene problemas con los medicamentos recetados.  Tiene flujo vaginal anormal. SOLICITE ATENCIN MDICA DE INMEDIATO SI:  Siente dolor en la espalda o a los lados.  Tiene escalofros.  Observa sangre en la orina.  Tiene nuseas o vmitos.  Siente contracciones en el tero.  Tiene una  perdida de lquido en chorro por la vagina. ASEGRESE DE QUE:  Comprende estas instrucciones.   Controlar su afeccin.   Recibir ayuda de inmediato si no mejora o si empeora.    Esta informacin no tiene Theme park managercomo fin reemplazar el consejo del mdico. Asegrese de hacerle al mdico cualquier pregunta que tenga.   Document Released: 03/13/2012 Document Revised: 04/09/2013 Elsevier Interactive Patient Education [employer]2016 Elsevier Inc.

## 2015-05-02 LAB — CULTURE, OB URINE

## 2015-07-04 NOTE — L&D Delivery Note (Signed)
Delivery Note At 3:05 AM a viable and healthy female was delivered via Vaginal, Spontaneous Delivery (Presentation: ; Occiput Anterior).  APGAR:9;9. weight pending Placenta status: intact.  Cord: 3 vessels with the following complications: None.   Anesthesia: None  Episiotomy: None Lacerations: None Suture Repair: None Est. Blood Loss (mL): 100  Mom to postpartum.  Baby to Couplet care / Skin to Skin. Pamela Alvarado is a 30 y.o. female [phone] with IUP at [id number] admitted for .  She progressed without augmentation to complete and pushed around 30 minutes to deliver.  Cord clamping delayed by several minutes then clamped by CNM and cut by FOB.  Placenta intact and spontaneous, bleeding minimal.  No laceration repaired without difficulty.  Mom and baby stable prior to transfer to postpartum. She does not desire circumcision.  She plans on breastfeeding. She requests nexplanon for birth control.    Pamela Alvarado 09/21/2015, 3:28 AM  I was gloved and present for entire delivery Delivery was uneventful and tolerated well by patient No difficulty with shoulders Placenta delivered spontaneously and grossly intact Perineum as described above. Suturing not indicated. Agree with delivery note above. Pamela Alvarado, CNM

## 2015-08-26 LAB — OB RESULTS CONSOLE GBS: STREP GROUP B AG: NEGATIVE

## 2015-09-06 ENCOUNTER — Inpatient Hospital Stay (HOSPITAL_COMMUNITY)
Admission: AD | Admit: 2015-09-06 | Discharge: 2015-09-06 | Disposition: A | Discharge status: Home / Self Care | Payer: Self-pay | Source: Ambulatory Visit | Attending: Family Medicine | Admitting: Family Medicine

## 2015-09-06 DIAGNOSIS — Z3493 Encounter for supervision of normal pregnancy, unspecified, third trimester: Secondary | ICD-10-CM | POA: Insufficient documentation

## 2015-09-06 NOTE — MAU Note (Signed)
Pt c/o contractions about 15 minutes apart starting yesterday morning around 8am. Pt denies bleeding and leaking of fluid.

## 2015-09-14 ENCOUNTER — Inpatient Hospital Stay (HOSPITAL_COMMUNITY)
Admission: AD | Admit: 2015-09-14 | Discharge: 2015-09-14 | Disposition: A | Discharge status: Home / Self Care | Payer: Self-pay | Source: Ambulatory Visit | Attending: Obstetrics & Gynecology | Admitting: Obstetrics & Gynecology

## 2015-09-14 ENCOUNTER — Encounter (HOSPITAL_COMMUNITY): Payer: Self-pay | Admitting: *Deleted

## 2015-09-14 DIAGNOSIS — Z3493 Encounter for supervision of normal pregnancy, unspecified, third trimester: Secondary | ICD-10-CM | POA: Insufficient documentation

## 2015-09-14 LAB — POCT PREGNANCY, URINE: Preg Test, Ur: POSITIVE — AB

## 2015-09-14 NOTE — Discharge Instructions (Signed)
Third Trimester of Pregnancy °The third trimester is from week 29 through week 42, months 7 through 9. The third trimester is a time when the fetus is growing rapidly. At the end of the ninth month, the fetus is about 20 inches in length and weighs 6-10 pounds.  °BODY CHANGES °Your body goes through many changes during pregnancy. The changes vary from woman to woman.  °· Your weight will continue to increase. You can expect to gain 25-35 pounds (11-16 kg) by the end of the pregnancy. °· You may begin to get stretch marks on your hips, abdomen, and breasts. °· You may urinate more often because the fetus is moving lower into your pelvis and pressing on your bladder. °· You may develop or continue to have heartburn as a result of your pregnancy. °· You may develop constipation because certain hormones are causing the muscles that push waste through your intestines to slow down. °· You may develop hemorrhoids or swollen, bulging veins (varicose veins). °· You may have pelvic pain because of the weight gain and pregnancy hormones relaxing your joints between the bones in your pelvis. Backaches may result from overexertion of the muscles supporting your posture. °· You may have changes in your hair. These can include thickening of your hair, rapid growth, and changes in texture. Some women also have hair loss during or after pregnancy, or hair that feels dry or thin. Your hair will most likely return to normal after your baby is born. °· Your breasts will continue to grow and be tender. A yellow discharge may leak from your breasts called colostrum. °· Your belly button may stick out. °· You may feel short of breath because of your expanding uterus. °· You may notice the fetus "dropping," or moving lower in your abdomen. °· You may have a bloody mucus discharge. This usually occurs a few days to a week before labor begins. °· Your cervix becomes thin and soft (effaced) near your due date. °WHAT TO EXPECT AT YOUR PRENATAL  EXAMS  °You will have prenatal exams every 2 weeks until week 36. Then, you will have weekly prenatal exams. During a routine prenatal visit: °· You will be weighed to make sure you and the fetus are growing normally. °· Your blood pressure is taken. °· Your abdomen will be measured to track your baby's growth. °· The fetal heartbeat will be listened to. °· Any test results from the previous visit will be discussed. °· You may have a cervical check near your due date to see if you have effaced. °At around 36 weeks, your caregiver will check your cervix. At the same time, your caregiver will also perform a test on the secretions of the vaginal tissue. This test is to determine if a type of bacteria, Group B streptococcus, is present. Your caregiver will explain this further. °Your caregiver may ask you: °· What your birth plan is. °· How you are feeling. °· If you are feeling the baby move. °· If you have had any abnormal symptoms, such as leaking fluid, bleeding, severe headaches, or abdominal cramping. °· If you are using any tobacco products, including cigarettes, chewing tobacco, and electronic cigarettes. °· If you have any questions. °Other tests or screenings that may be performed during your third trimester include: °· Blood tests that check for low iron levels (anemia). °· Fetal testing to check the health, activity level, and growth of the fetus. Testing is done if you have certain medical conditions or if   there are problems during the pregnancy. °· HIV (human immunodeficiency virus) testing. If you are at high risk, you may be screened for HIV during your third trimester of pregnancy. °FALSE LABOR °You may feel small, irregular contractions that eventually go away. These are called Braxton Hicks contractions, or false labor. Contractions may last for hours, days, or even weeks before true labor sets in. If contractions come at regular intervals, intensify, or become painful, it is best to be seen by your  caregiver.  °SIGNS OF LABOR  °· Menstrual-like cramps. °· Contractions that are 5 minutes apart or less. °· Contractions that start on the top of the uterus and spread down to the lower abdomen and back. °· A sense of increased pelvic pressure or back pain. °· A watery or bloody mucus discharge that comes from the vagina. °If you have any of these signs before the 37th week of pregnancy, call your caregiver right away. You need to go to the hospital to get checked immediately. °HOME CARE INSTRUCTIONS  °· Avoid all smoking, herbs, alcohol, and unprescribed drugs. These chemicals affect the formation and growth of the baby. °· Do not use any tobacco products, including cigarettes, chewing tobacco, and electronic cigarettes. If you need help quitting, ask your health care provider. You may receive counseling support and other resources to help you quit. °· Follow your caregiver's instructions regarding medicine use. There are medicines that are either safe or unsafe to take during pregnancy. °· Exercise only as directed by your caregiver. Experiencing uterine cramps is a good sign to stop exercising. °· Continue to eat regular, healthy meals. °· Wear a good support bra for breast tenderness. °· Do not use hot tubs, steam rooms, or saunas. °· Wear your seat belt at all times when driving. °· Avoid raw meat, uncooked cheese, cat litter boxes, and soil used by cats. These carry germs that can cause birth defects in the baby. °· Take your prenatal vitamins. °· Take 1500-2000 mg of calcium daily starting at the 20th week of pregnancy until you deliver your baby. °· Try taking a stool softener (if your caregiver approves) if you develop constipation. Eat more high-fiber foods, such as fresh vegetables or fruit and whole grains. Drink plenty of fluids to keep your urine clear or pale yellow. °· Take warm sitz baths to soothe any pain or discomfort caused by hemorrhoids. Use hemorrhoid cream if your caregiver approves. °· If  you develop varicose veins, wear support hose. Elevate your feet for 15 minutes, 3-4 times a day. Limit salt in your diet. °· Avoid heavy lifting, wear low heal shoes, and practice good posture. °· Rest a lot with your legs elevated if you have leg cramps or low back pain. °· Visit your dentist if you have not gone during your pregnancy. Use a soft toothbrush to brush your teeth and be gentle when you floss. °· A sexual relationship may be continued unless your caregiver directs you otherwise. °· Do not travel far distances unless it is absolutely necessary and only with the approval of your caregiver. °· Take prenatal classes to understand, practice, and ask questions about the labor and delivery. °· Make a trial run to the hospital. °· Pack your hospital bag. °· Prepare the baby's nursery. °· Continue to go to all your prenatal visits as directed by your caregiver. °SEEK MEDICAL CARE IF: °· You are unsure if you are in labor or if your water has broken. °· You have dizziness. °· You have   mild pelvic cramps, pelvic pressure, or nagging pain in your abdominal area. °· You have persistent nausea, vomiting, or diarrhea. °· You have a bad smelling vaginal discharge. °· You have pain with urination. °SEEK IMMEDIATE MEDICAL CARE IF:  °· You have a fever. °· You are leaking fluid from your vagina. °· You have spotting or bleeding from your vagina. °· You have severe abdominal cramping or pain. °· You have rapid weight loss or gain. °· You have shortness of breath with chest pain. °· You notice sudden or extreme swelling of your face, hands, ankles, feet, or legs. °· You have not felt your baby move in over an hour. °· You have severe headaches that do not go away with medicine. °· You have vision changes. °  °This information is not intended to replace advice given to you by your health care provider. Make sure you discuss any questions you have with your health care provider. °  °Document Released: 06/13/2001 Document  Revised: 07/10/2014 Document Reviewed: 08/20/2012 °Elsevier Interactive Patient Education ©2016 [employer] °Fetal Movement Counts °Patient Name: __________________________________________________ Patient Due Date: ____________________ °Performing a fetal movement count is highly recommended in high-risk pregnancies, but it is good for every pregnant woman to do. Your health care provider may ask you to start counting fetal movements at 28 weeks of the pregnancy. Fetal movements often increase: °· After eating a full meal. °· After physical activity. °· After eating or drinking something sweet or cold. °· At rest. °Pay attention to when you feel the baby is most active. This will help you notice a pattern of your baby's sleep and wake cycles and what factors contribute to an increase in fetal movement. It is important to perform a fetal movement count at the same time each day when your baby is normally most active.  °HOW TO COUNT FETAL MOVEMENTS °· Find a quiet and comfortable area to sit or lie down on your left side. Lying on your left side provides the best blood and oxygen circulation to your baby. °· Write down the day and time on a sheet of paper or in a journal. °· Start counting kicks, flutters, swishes, rolls, or jabs in a 2-hour period. You should feel at least 10 movements within 2 hours. °· If you do not feel 10 movements in 2 hours, wait 2-3 hours and count again. Look for a change in the pattern or not enough counts in 2 hours. °SEEK MEDICAL CARE IF: °· You feel less than 10 counts in 2 hours, tried twice. °· There is no movement in over an hour. °· The pattern is changing or taking longer each day to reach 10 counts in 2 hours. °· You feel the baby is not moving as he or she usually does. °Date: ____________ Movements: ____________ Start time: ____________ Finish time: ____________  °Date: ____________ Movements: ____________ Start time: ____________ Finish time: ____________ °Date: ____________  Movements: ____________ Start time: ____________ Finish time: ____________ °Date: ____________ Movements: ____________ Start time: ____________ Finish time: ____________ °Date: ____________ Movements: ____________ Start time: ____________ Finish time: ____________ °Date: ____________ Movements: ____________ Start time: ____________ Finish time: ____________ °Date: ____________ Movements: ____________ Start time: ____________ Finish time: ____________ °Date: ____________ Movements: ____________ Start time: ____________ Finish time: ____________  °Date: ____________ Movements: ____________ Start time: ____________ Finish time: ____________ °Date: ____________ Movements: ____________ Start time: ____________ Finish time: ____________ °Date: ____________ Movements: ____________ Start time: ____________ Finish time: ____________ °Date: ____________ Movements: ____________ Start time: ____________ Finish time: ____________ °Date:   ____________ Movements: ____________ Start time: ____________ Finish time: ____________ °Date: ____________ Movements: ____________ Start time: ____________ Finish time: ____________ °Date: ____________ Movements: ____________ Start time: ____________ Finish time: ____________  °Date: ____________ Movements: ____________ Start time: ____________ Finish time: ____________ °Date: ____________ Movements: ____________ Start time: ____________ Finish time: ____________ °Date: ____________ Movements: ____________ Start time: ____________ Finish time: ____________ °Date: ____________ Movements: ____________ Start time: ____________ Finish time: ____________ °Date: ____________ Movements: ____________ Start time: ____________ Finish time: ____________ °Date: ____________ Movements: ____________ Start time: ____________ Finish time: ____________ °Date: ____________ Movements: ____________ Start time: ____________ Finish time: ____________  °Date: ____________ Movements: ____________ Start time:  ____________ Finish time: ____________ °Date: ____________ Movements: ____________ Start time: ____________ Finish time: ____________ °Date: ____________ Movements: ____________ Start time: ____________ Finish time: ____________ °Date: ____________ Movements: ____________ Start time: ____________ Finish time: ____________ °Date: ____________ Movements: ____________ Start time: ____________ Finish time: ____________ °Date: ____________ Movements: ____________ Start time: ____________ Finish time: ____________ °Date: ____________ Movements: ____________ Start time: ____________ Finish time: ____________  °Date: ____________ Movements: ____________ Start time: ____________ Finish time: ____________ °Date: ____________ Movements: ____________ Start time: ____________ Finish time: ____________ °Date: ____________ Movements: ____________ Start time: ____________ Finish time: ____________ °Date: ____________ Movements: ____________ Start time: ____________ Finish time: ____________ °Date: ____________ Movements: ____________ Start time: ____________ Finish time: ____________ °Date: ____________ Movements: ____________ Start time: ____________ Finish time: ____________ °Date: ____________ Movements: ____________ Start time: ____________ Finish time: ____________  °Date: ____________ Movements: ____________ Start time: ____________ Finish time: ____________ °Date: ____________ Movements: ____________ Start time: ____________ Finish time: ____________ °Date: ____________ Movements: ____________ Start time: ____________ Finish time: ____________ °Date: ____________ Movements: ____________ Start time: ____________ Finish time: ____________ °Date: ____________ Movements: ____________ Start time: ____________ Finish time: ____________ °Date: ____________ Movements: ____________ Start time: ____________ Finish time: ____________ °Date: ____________ Movements: ____________ Start time: ____________ Finish time: ____________  °Date:  ____________ Movements: ____________ Start time: ____________ Finish time: ____________ °Date: ____________ Movements: ____________ Start time: ____________ Finish time: ____________ °Date: ____________ Movements: ____________ Start time: ____________ Finish time: ____________ °Date: ____________ Movements: ____________ Start time: ____________ Finish time: ____________ °Date: ____________ Movements: ____________ Start time: ____________ Finish time: ____________ °Date: ____________ Movements: ____________ Start time: ____________ Finish time: ____________ °Date: ____________ Movements: ____________ Start time: ____________ Finish time: ____________  °Date: ____________ Movements: ____________ Start time: ____________ Finish time: ____________ °Date: ____________ Movements: ____________ Start time: ____________ Finish time: ____________ °Date: ____________ Movements: ____________ Start time: ____________ Finish time: ____________ °Date: ____________ Movements: ____________ Start time: ____________ Finish time: ____________ °Date: ____________ Movements: ____________ Start time: ____________ Finish time: ____________ °Date: ____________ Movements: ____________ Start time: ____________ Finish time: ____________ °  °This information is not intended to replace advice given to you by your health care provider. Make sure you discuss any questions you have with your health care provider. °  °Document Released: 07/19/2006 Document Revised: 07/10/2014 Document Reviewed: 04/15/2012 °Elsevier Interactive Patient Education ©2016 [employer] °Braxton Hicks Contractions °Contractions of the uterus can occur throughout pregnancy. Contractions are not always a sign that you are in labor.  °WHAT ARE BRAXTON HICKS CONTRACTIONS?  °Contractions that occur before labor are called Braxton Hicks contractions, or false labor. Toward the end of pregnancy (32-34 weeks), these contractions can develop more often and may become more  forceful. This is not true labor because these contractions do not result in opening (dilatation) and thinning of the cervix. They are sometimes difficult to tell apart from true labor because these contractions can be forceful and people have different pain tolerances. You should   not feel embarrassed if you go to the hospital with false labor. Sometimes, the only way to tell if you are in true labor is for your health care provider to look for changes in the cervix. °If there are no prenatal problems or other health problems associated with the pregnancy, it is completely safe to be sent home with false labor and await the onset of true labor. °HOW CAN YOU TELL THE DIFFERENCE BETWEEN TRUE AND FALSE LABOR? °False Labor °· The contractions of false labor are usually shorter and not as hard as those of true labor.   °· The contractions are usually irregular.   °· The contractions are often felt in the front of the lower abdomen and in the groin.   °· The contractions may go away when you walk around or change positions while lying down.   °· The contractions get weaker and are shorter lasting as time goes on.   °· The contractions do not usually become progressively stronger, regular, and closer together as with true labor.   °True Labor °· Contractions in true labor last 30-70 seconds, become very regular, usually become more intense, and increase in frequency.   °· The contractions do not go away with walking.   °· The discomfort is usually felt in the top of the uterus and spreads to the lower abdomen and low back.   °· True labor can be determined by your health care provider with an exam. This will show that the cervix is dilating and getting thinner.   °WHAT TO REMEMBER °· Keep up with your usual exercises and follow other instructions given by your health care provider.   °· Take medicines as directed by your health care provider.   °· Keep your regular prenatal appointments.   °· Eat and drink lightly if you  think you are going into labor.   °· If Braxton Hicks contractions are making you uncomfortable:   °· Change your position from lying down or resting to walking, or from walking to resting.   °· Sit and rest in a tub of warm water.   °· Drink 2-3 glasses of water. Dehydration may cause these contractions.   °· Do slow and deep breathing several times an hour.   °WHEN SHOULD I SEEK IMMEDIATE MEDICAL CARE? °Seek immediate medical care if: °· Your contractions become stronger, more regular, and closer together.   °· You have fluid leaking or gushing from your vagina.   °· You have a fever.   °· You pass blood-tinged mucus.   °· You have vaginal bleeding.   °· You have continuous abdominal pain.   °· You have low back pain that you never had before.   °· You feel your baby's head pushing down and causing pelvic pressure.   °· Your baby is not moving as much as it used to.   °  °This information is not intended to replace advice given to you by your health care provider. Make sure you discuss any questions you have with your health care provider. °  °Document Released: 06/19/2005 Document Revised: 06/24/2013 Document Reviewed: 03/31/2013 °Elsevier Interactive Patient Education ©2016 [employer] ° °

## 2015-09-14 NOTE — MAU Note (Signed)
PT SAYS  WITH INTERPRETER- VIRIA,     UC  HURT BAD   AT 6 PM.       PNC- WITH  HD-  VE  2 WEEKS AGO   1 CM.     DENIES HSV AND MRSA       . GBS- UNSURE

## 2015-09-20 ENCOUNTER — Inpatient Hospital Stay (HOSPITAL_COMMUNITY)
Admission: AD | Admit: 2015-09-20 | Discharge: 2015-09-23 | DRG: 775 | Disposition: A | Discharge status: Home / Self Care | Payer: Medicaid Other | Source: Ambulatory Visit | Attending: Obstetrics and Gynecology | Admitting: Obstetrics and Gynecology

## 2015-09-20 ENCOUNTER — Encounter (HOSPITAL_COMMUNITY): Payer: Self-pay | Admitting: *Deleted

## 2015-09-20 DIAGNOSIS — O26893 Other specified pregnancy related conditions, third trimester: Secondary | ICD-10-CM | POA: Diagnosis present

## 2015-09-20 DIAGNOSIS — Z3A40 40 weeks gestation of pregnancy: Secondary | ICD-10-CM

## 2015-09-20 DIAGNOSIS — Z833 Family history of diabetes mellitus: Secondary | ICD-10-CM

## 2015-09-20 DIAGNOSIS — Z3490 Encounter for supervision of normal pregnancy, unspecified, unspecified trimester: Secondary | ICD-10-CM

## 2015-09-20 DIAGNOSIS — R05 Cough: Secondary | ICD-10-CM | POA: Diagnosis not present

## 2015-09-20 DIAGNOSIS — H6693 Otitis media, unspecified, bilateral: Secondary | ICD-10-CM | POA: Diagnosis present

## 2015-09-20 DIAGNOSIS — O0990 Supervision of high risk pregnancy, unspecified, unspecified trimester: Secondary | ICD-10-CM

## 2015-09-20 LAB — CBC
HCT: 37.4 % (ref 36.0–46.0)
Hemoglobin: 13.4 g/dL (ref 12.0–15.0)
MCH: 30.1 pg (ref 26.0–34.0)
MCHC: 35.8 g/dL (ref 30.0–36.0)
MCV: 84 fL (ref 78.0–100.0)
PLATELETS: 222 10*3/uL (ref 150–400)
RBC: 4.45 MIL/uL (ref 3.87–5.11)
RDW: 13.5 % (ref 11.5–15.5)
WBC: 9.5 10*3/uL (ref 4.0–10.5)

## 2015-09-20 LAB — TYPE AND SCREEN
ABO/RH(D): O POS
Antibody Screen: NEGATIVE

## 2015-09-20 MED ORDER — ONDANSETRON HCL 4 MG/2ML IJ SOLN: 4.0000 mg | Freq: Four times a day (QID) | INTRAMUSCULAR | Status: DC | PRN

## 2015-09-20 MED ORDER — CITRIC ACID-SODIUM CITRATE 334-500 MG/5ML PO SOLN: 30.0000 mL | ORAL | Status: DC | PRN

## 2015-09-20 MED ORDER — ACETAMINOPHEN 325 MG PO TABS: 650.0000 mg | ORAL_TABLET | ORAL | Status: DC | PRN

## 2015-09-20 MED ORDER — OXYTOCIN 10 UNIT/ML IJ SOLN: 2.5000 [IU]/h | INTRAVENOUS | Status: DC

## 2015-09-20 MED ORDER — CEPHALEXIN 500 MG PO CAPS
500.0000 mg | ORAL_CAPSULE | Freq: Three times a day (TID) | ORAL | Status: DC
  Filled 2015-09-20 (×2): qty 1

## 2015-09-20 MED ORDER — LACTATED RINGERS IV SOLN
INTRAVENOUS | Status: DC
  Administered 2015-09-20 (×2): 125 mL/h via INTRAVENOUS

## 2015-09-20 MED ORDER — FLEET ENEMA 7-19 GM/118ML RE ENEM: 1.0000 | ENEMA | RECTAL | Status: DC | PRN

## 2015-09-20 MED ORDER — OXYCODONE-ACETAMINOPHEN 5-325 MG PO TABS: 2.0000 | ORAL_TABLET | ORAL | Status: DC | PRN

## 2015-09-20 MED ORDER — LACTATED RINGERS IV SOLN: 500.0000 mL | INTRAVENOUS | Status: DC | PRN

## 2015-09-20 MED ORDER — LIDOCAINE HCL (PF) 1 % IJ SOLN
30.0000 mL | INTRAMUSCULAR | Status: DC | PRN
  Filled 2015-09-20: qty 30

## 2015-09-20 MED ORDER — OXYTOCIN BOLUS FROM INFUSION
500.0000 mL | INTRAVENOUS | Status: DC
Start: 2015-09-20 — End: 2015-09-21

## 2015-09-20 MED ORDER — OXYCODONE-ACETAMINOPHEN 5-325 MG PO TABS
1.0000 | ORAL_TABLET | ORAL | Status: DC | PRN
Start: 2015-09-20 — End: 2015-09-21

## 2015-09-20 NOTE — H&P (Signed)
OBSTETRIC ADMISSION HISTORY AND PHYSICAL  H&P taken with assistance from spanish interpreter  Pamela Alvarado is a 30 y.o. female [phone] with IUP at 3435w2d by U/S presenting for contractions. She reports +FMs, No LOF, no VB, no blurry vision, headaches or peripheral edema, and RUQ pain.  She plans on breast feeding. She is unsure about whether or not she wants birth control. No issues this pregnancy per patient. Dating: By U/S --->  Estimated Date of Delivery: 09/18/15  Sono:  @[redacted]w[redacted]d , cephalic presentation, 291g, 48% EFW   Prenatal History/Complications: Posterior low lying placenta on 04/26/15 but it resolved on 08/16/15 [facility]    Past Medical History: Past Medical History  Diagnosis Date  . [place]varian cyst 2004    [place]   Hx of post partum depression x2   . Hypertension     Past Surgical History: Past Surgical History  Procedure Laterality Date  . Cholecystectomy, laparoscopic           Obstetrical History: OB History    Gravida Para Term Preterm AB TAB SAB Ectopic Multiple Living   5 2 2  0 2 0 2 0 0 2      Social History: Social History   Social History  . Marital Status: Married    Spouse Name: N/A  . Number of Children: N/A  . Years of Education: N/A   Social History Main Topics  . Smoking status: Never Smoker   . Smokeless tobacco: Never Used  . Alcohol Use: No  . Drug Use: No  . Sexual Activity: Yes    Birth Control/ Protection: None   Other Topics Concern  . Not on file   Social History Narrative    Family History: Family History  Problem Relation Age of Onset  . Diabetes Maternal Grandmother   . Tuberculosis Maternal Grandfather   . Diabetes Paternal Grandmother   . Diabetes Paternal Grandfather     Allergies: No Known Allergies  Prescriptions prior to admission  Medication Sig Dispense Refill Last Dose  . cephALEXin (KEFLEX) 500 MG capsule Take 1 capsule (500 mg total) by mouth 3 (three) times daily.  (Patient not taking: Reported on 09/06/2015) 15 capsule 0   . Prenatal Vit-Fe Fumarate-FA (PRENATAL VITAMIN PO) Take by mouth.   09/05/2015 at Unknown time     Review of Systems   All systems reviewed and negative except as stated in HPI  Last menstrual period 11/29/2014, unknown if currently breastfeeding. General appearance: alert, cooperative and no distress Lungs: clear to auscultation bilaterally Heart: regular rate and rhythm Abdomen: soft, non-tender; bowel sounds normal Extremities: Homans sign is negative, no sign of DVT Presentation: cephalic Fetal monitoringBaseline: 145 bpm, Variability: Good {> 6 bpm), Accelerations: Reactive and Decelerations: Variables present Uterine activityFrequency: Every 4-7 minutes Dilation: 4 Effacement (%): 60 Station: -3 Exam by:: Dr. Pennie RushingGambino/S. Carrera GYN:  External genitalia within normal limits. Bloody show and mucous on cervical exam  Prenatal labs: ABO, Rh: --/--/O POS (03/28 2335) Antibody: Negative (08/15 0000) Rubella: Immune RPR: Nonreactive (08/15 0000)  HBsAg: Negative (08/15 0000)  HIV: Non-reactive (08/15 0000)  GBS: Negative (02/23 0000)  3 hr GTT: 105/111/99 Genetic screening : Neg Anatomy US:   Prenatal Transfer Tool  Maternal Diabetes: No Genetic Screening: Normal Maternal Ultrasounds/Referrals: Normal Fetal Ultrasounds or other Referrals:  None Maternal Substance Abuse:  No Significant Maternal Medications:  None Significant Maternal Lab Results: None  No results found for this or any previous visit (from the past  24 hour(s)).  There are no active problems to display for this patient.   Assessment: Pamela Alvarado is a 30 y.o. [phone] at [redacted]w[redacted]d here for contractions and variable decelerations. Admit to labor and delivery.   #Labor: Latent phase. Will consider giving Cytotec vs Pitocin to improve contractions #Pain: No pain medications at this time #FWB:  Cat 2 due to current variable decels #ID:  GBS negative. Has b/l otitis media diagnosed today at another provider's office and was given Keflex 500 mg TID, will continue on admission #MOF: Breast #MOC: Unsure #Circ:  Unsure of baby's sex  Beaulah Dinning 09/20/2015, 5:55 PM

## 2015-09-20 NOTE — MAU Note (Signed)
Contractions since this AM. No leaking or bleeding.

## 2015-09-21 ENCOUNTER — Encounter (HOSPITAL_COMMUNITY): Payer: Self-pay

## 2015-09-21 DIAGNOSIS — R05 Cough: Secondary | ICD-10-CM

## 2015-09-21 DIAGNOSIS — O26893 Other specified pregnancy related conditions, third trimester: Secondary | ICD-10-CM

## 2015-09-21 DIAGNOSIS — Z3A40 40 weeks gestation of pregnancy: Secondary | ICD-10-CM

## 2015-09-21 LAB — RPR: RPR Ser Ql: NONREACTIVE

## 2015-09-21 MED ORDER — DIBUCAINE 1 % RE OINT: 1.0000 "application " | TOPICAL_OINTMENT | RECTAL | Status: DC | PRN

## 2015-09-21 MED ORDER — ZOLPIDEM TARTRATE 5 MG PO TABS: 5.0000 mg | ORAL_TABLET | Freq: Every evening | ORAL | Status: DC | PRN

## 2015-09-21 MED ORDER — SENNOSIDES-DOCUSATE SODIUM 8.6-50 MG PO TABS
2.0000 | ORAL_TABLET | ORAL | Status: DC
  Administered 2015-09-22 (×2): 2 via ORAL
  Filled 2015-09-21 (×2): qty 2

## 2015-09-21 MED ORDER — WITCH HAZEL-GLYCERIN EX PADS: 1.0000 "application " | MEDICATED_PAD | CUTANEOUS | Status: DC | PRN

## 2015-09-21 MED ORDER — SIMETHICONE 80 MG PO CHEW: 80.0000 mg | CHEWABLE_TABLET | ORAL | Status: DC | PRN

## 2015-09-21 MED ORDER — LANOLIN HYDROUS EX OINT: TOPICAL_OINTMENT | CUTANEOUS | Status: DC | PRN

## 2015-09-21 MED ORDER — OXYTOCIN 10 UNIT/ML IJ SOLN
1.0000 m[IU]/min | INTRAVENOUS | Status: DC
  Administered 2015-09-21: 2 m[IU]/min via INTRAVENOUS
  Filled 2015-09-21: qty 10

## 2015-09-21 MED ORDER — OXYCODONE-ACETAMINOPHEN 5-325 MG PO TABS
2.0000 | ORAL_TABLET | ORAL | Status: DC | PRN
Start: 2015-09-21 — End: 2015-09-23

## 2015-09-21 MED ORDER — BENZOCAINE-MENTHOL 20-0.5 % EX AERO: 1.0000 "application " | INHALATION_SPRAY | CUTANEOUS | Status: DC | PRN

## 2015-09-21 MED ORDER — ONDANSETRON HCL 4 MG/2ML IJ SOLN: 4.0000 mg | INTRAMUSCULAR | Status: DC | PRN

## 2015-09-21 MED ORDER — TETANUS-DIPHTH-ACELL PERTUSSIS 5-2.5-18.5 LF-MCG/0.5 IM SUSP: 0.5000 mL | Freq: Once | INTRAMUSCULAR | Status: DC

## 2015-09-21 MED ORDER — ONDANSETRON HCL 4 MG PO TABS: 4.0000 mg | ORAL_TABLET | ORAL | Status: DC | PRN

## 2015-09-21 MED ORDER — OXYCODONE-ACETAMINOPHEN 5-325 MG PO TABS
1.0000 | ORAL_TABLET | ORAL | Status: DC | PRN
  Administered 2015-09-21 (×2): 1 via ORAL
  Filled 2015-09-21: qty 1

## 2015-09-21 MED ORDER — TERBUTALINE SULFATE 1 MG/ML IJ SOLN
0.2500 mg | Freq: Once | INTRAMUSCULAR | Status: DC | PRN
  Filled 2015-09-21: qty 1

## 2015-09-21 MED ORDER — ACETAMINOPHEN 325 MG PO TABS
650.0000 mg | ORAL_TABLET | ORAL | Status: DC | PRN
  Administered 2015-09-22 (×2): 650 mg via ORAL
  Filled 2015-09-21 (×2): qty 2

## 2015-09-21 MED ORDER — PRENATAL MULTIVITAMIN CH
1.0000 | ORAL_TABLET | Freq: Every day | ORAL | Status: DC
  Administered 2015-09-21 – 2015-09-23 (×3): 1 via ORAL
  Filled 2015-09-21 (×3): qty 1

## 2015-09-21 MED ORDER — DIPHENHYDRAMINE HCL 25 MG PO CAPS: 25.0000 mg | ORAL_CAPSULE | Freq: Four times a day (QID) | ORAL | Status: DC | PRN

## 2015-09-21 MED ORDER — IBUPROFEN 600 MG PO TABS
600.0000 mg | ORAL_TABLET | Freq: Four times a day (QID) | ORAL | Status: DC
  Administered 2015-09-21 – 2015-09-23 (×10): 600 mg via ORAL
  Filled 2015-09-21 (×10): qty 1

## 2015-09-21 NOTE — Lactation Note (Addendum)
This note was copied from a baby's chart. Lactation Consultation Note  Video remote interpreter used for Spanish.  Eda stated she was unavailable.  Baby 8 hours old.  Baby has been breastfed x3 since birth. P3, Ex BF 1st child for 1 year and 1 month, 2nd child 2 months due to gallbladder medication. Mother has been prepumping and hand expressing to help invert semi flat nipples before latching. Denies any problems or questions. Baby sleeping.  Suggest mother call if she would like help with with next feeding. Mom encouraged to feed baby 8-12 times/24 hours and with feeding cues. Encouraged mother to feed baby STS and unwrap for feedings. Mother had english Baby & Me booklet.  Provided her with Spanish Baby & Me booklet and reviewed pg. 24 to monitor voids/stools. Mom made aware of O/P services, breastfeeding support groups, community resources, and our phone # for post-discharge questions in Spanish.      Patient Name: Pamela Alvarado Sixtega [id number]'s Date: 09/21/2015 Reason for consult: Initial assessment   Maternal Data Has patient been taught Hand Expression?: Yes Does the patient have breastfeeding experience prior to this delivery?: Yes  Feeding Feeding Type: Breast Milk Length of feed: 40 min  LATCH Score/Interventions                      Lactation Tools Discussed/Used     Consult Status Consult Status: Follow-up Date: 09/22/15 Follow-up type: In-patient    Dahlia ByesBerkelhammer, Ruth [facility] 09/21/2015, 12:26 PM

## 2015-09-21 NOTE — Progress Notes (Signed)
1150. Eta the interpreter came in patients room to explain my plan of care and to answer any questions.

## 2015-09-21 NOTE — Progress Notes (Signed)
UR chart review completed.  

## 2015-09-21 NOTE — Progress Notes (Signed)
Late entry. Patient seen at 2315 at MAU  FHR noted to be having variable decelerations, presumably with contractions UCs not tracing well  AVSS  FHR otherwise reactive Cervix checked Dilation: 5 Effacement (%): 80 Cervical Position: Posterior Station: -3 Presentation: Vertex Exam by:: Wynelle BourgeoisMarie Kodie Pick CNM  Vertex is high but not ballotable  Consulted DR Constant who states we can try AROM with IUPC/amnioinfusion  [facility]

## 2015-09-21 NOTE — Progress Notes (Signed)
Now on [facility]  Filed Vitals:   09/20/15 1755 09/20/15 1856 09/20/15 2315  BP: 132/85 137/87   Pulse: 98 88   Temp: 97.6 F (36.4 C)  97.7 F (36.5 C)  TempSrc: Oral  Oral  Resp: 18  16  Height:   4\' 10"  (1.473 m)  Weight:   187 lb (84.823 kg)    FHR improved, decels better  No longer deep variables  UCs spaced to every 4-5 min  WIll start Pitocin after discussing with patient using interpretor.

## 2015-09-22 MED ORDER — MENTHOL 3 MG MT LOZG
1.0000 | LOZENGE | OROMUCOSAL | Status: DC | PRN
  Filled 2015-09-22: qty 9

## 2015-09-22 MED ORDER — SALINE SPRAY 0.65 % NA SOLN
1.0000 | NASAL | Status: DC | PRN
  Administered 2015-09-22: 1 via NASAL
  Filled 2015-09-22: qty 44

## 2015-09-22 NOTE — Progress Notes (Signed)
Mother requested to start formula for baby in addition to breastfeeding. Viria was present for interpretation of formula sheet, LEAD, discussion of plan of care, and for any additional questions. 

## 2015-09-22 NOTE — Clinical Social Work Maternal (Signed)
CLINICAL SOCIAL WORK MATERNAL/CHILD NOTE  Patient Details  Name: Pamela Alvarado MRN: [id number] Date of Birth: [birth year]  Date:  09/22/2015  Clinical Social Worker Initiating Note:  Elissa Hefty, MSW intern  Date/ Time Initiated:  09/22/15/1100     Child's Name:  Pamela Alvarado    Legal Guardian:  Pamela Alvarado and Pamela Alvarado    Need for Interpreter:  Spanish    Date of Referral:  09/21/15     Reason for Referral:  Behavioral Health Issues, including SI    Referral Source:  [facility]   Address:  [address] 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 MOB denied any concerns during this pregnancy and voiced that circumstances are different and she has seek support from her church and mother's support group at [facility].   Cognitive State:  Insightful , Linear Thinking , Able to Concentrate    Mood/Affect:  Happy , Interested , Comfortable , Relaxed , Calm    CSW Assessment:  MSW intern presented in patients room due to a consult being placed by the central nursery because of a history of depression. FOB was present in the room and both provided verbal consent for MSW intern to engage. The assessment was conducted in Spanish per MOB's request. MOB shared that the birthing process went well and she was recovering well into postpartum. MOB disclosed she  was able to see herself pushing and the infant coming out because her OB and nurse placed a mirror in front of her while she delivered. FOB and MOB both shared that they loved that experience and were happy with the overall care at the hospital for both MOB and infant. However, MOB stated she felt "dizzy" and "weak" and preferred to stay another night just to make sure she was recovering well. Per MOB, she is breastfeeding but expressed she would like more help from lactation in order to latch the infant on better and reduce the pain when breastfeeding. MOB stated she has two children, ages 76 and 24, currently being cared for by church members while they are at the hospital. MOB voiced her children are excited to meet the infant and for them to come back home. According to FOB, they have met all of the infant's basic needs and he plans to take some time off of work to help MOB transition back home.   MSW intern asked MOB where she was from and how she liked the area. MOB shared they where both from [place] and had lived in [place] for about 12 years. MOB expressed she liked the area and had started to make friends at her church and go out more. MOB disclosed she would isolate herself before because she did not want other people in her life. MOB stated she did not go out much and would stay home alone with the children. MSW  intern and MOB discussed their level of comfort in regard to their undocumented status. MOB expressed she was fearful at first but has learned to take it one day at a time and not stress over the things she has no control over. MOB shared her strong faith in God and leaving all her problems and concerns with him.   MSW intern inquired about MOB's mental health during the pregnancy. MOB explained she had a "good " pregnancy and did not voice any mental health concerns during this pregnancy. 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 MOB shared she had intrusive thoughts and long periods of crying and feeling overwhelmed. Per MOB, her intrusive thoughts were homicidal and suicidal. MOB shared she thought about getting in the bathtub with her infant and drowning the both of them. MOB reported she knew the thoughts were irrational and she never wanted to act on them so she would remove herself from the situation when she could. MOB expressed she always made sure the infant was taken care off but when she knew  She could not control her thoughts she would lock herself in a room and cry until she felt better. MOB stated she never harmed her infant and was able to learn to cope with the feelings on her own. MOB denied being on any prescribed medications or therapy. Per MOB, only FOB knew about her intrusive thoughts and was her support system. MOB disclosed she finally told her story at a support group she attends at [facility] during this pregnancy. She shared that since she has shared her story she has received a lot of support and has been able to talk to her OB about her resources in the future if needs arise.  MSW intern asked MOB if FOB and her had discussed a plan on how to deal with the symptoms if they were to occur again. FOB shared feelings of hope that it would not happen this time around because they had made changes to their social support and lifestyle. MOB expressed that the last two pregnancies were hard for her because they had financial limitations, limited support, and FOB worked all the time. MOB expressed feelings of resentment towards FOB because she had to care for the children alone. MOB reported that FOB has always taken care of her and even would cook for her and clean the house prior to going to work but because he was not at home helping her with the children she could not see all he did for them. MOB stated that not letting others into their life also affected her and made her feel lonely.  However, both expressed they have joined a church and have made several friends there. MOB reported her children are being taken care off by one of the church members while they are at the hospital. MOB also voiced that they spend more time as a family now and try to keep themselves busy with church events and social gatherings. Per MOB, this whole pregnancy and birthing process was different in a positive manner. MOB shared FOB had a more flexible employer and they no longer had any financial insecurities. MOB stated she knew what symptoms to look out for and when to communicate them to FOB. FOB also voiced being more aware and making sure he was attentive to MOB and the children.  MSW intern provided education on perinatal mood disorders. MOB disclosed  the support group has been beneficial for her because she has been able to hear other mothers go through similar situations and how they coped with their symptoms. MOB reported that she has a few sessions left with them and once the group is over they will provide them with mental health resources as needed. MSW intern asked MOB how she felt about individual therapy and prescribed medications. MOB expressed she did not oppose to either one but did not see the need for them at the moment. MOB reassured MSW intern she was doing well and feeling well. MOB shared she was ready to go home and present the infant to her other children and spend time with them. MSW intern and MOB went over coping skills she can practice at home. MSW intern asked MOB what she liked to do for fun. MOB reported she likes volunteering at church events and spending time with her family. MOB stated they like to go to the movies or out for coffee. MSW intern encouraged MOB to continue doing those things and reminded her of the importance of sleep and self-care.   MSW intern and FOB denied having any further questions or concerns but thanked MSW intern for all the information provided. MSW  intern left the contact information for [facility] of the [place] per MOB's request. MOB agreed to contact MSW intern if further needs arise.   MSW intern also provided a PSI handout with Spanish resources on perinatal mood disorders for MOB.   CSW Plan/Description:   Engineer, mining- MSW intern provided education on perinatal mood disorders.  No Further Intervention Required/No Barriers to Discharge    Trevor Iha, Student-SW 09/22/2015, 2:00 PM

## 2015-09-22 NOTE — Lactation Note (Signed)
This note was copied from a baby's chart. Lactation Consultation Note  Copyacifica Interpreter used for [place]. Mother states baby cannot get his whole mouth on nipple.   Mother has inverted nipples that are tender with abrasion on L side. Offered to help with latch. Left LC phone number and suggest she call for assistance with next feeding.  Patient Name: Pamela Alvarado [id number]'s Date: 09/22/2015 Reason for consult: Follow-up assessment   Maternal Data    Feeding Feeding Type: Breast Fed Length of feed: 30 min  LATCH Score/Interventions                      Lactation Tools Discussed/Used     Consult Status Consult Status: Follow-up Date: 09/23/15 Follow-up type: In-patient    Dahlia ByesBerkelhammer, Tatijana Bierly [facility] 09/22/2015, 12:57 PM

## 2015-09-22 NOTE — Progress Notes (Signed)
Eta the interpreter in to address plan of care and answer any questions. Dr. Marlan PalauKambino notified that patient wanted to stay another night. Dr. Marlan PalauKambino aware of patient still being slightly dizzy when she gets up. Lochia small no clots, fundus firm. Will monitor.

## 2015-09-22 NOTE — Progress Notes (Signed)
PPD#1 Subjective:  Pamela Alvarado is 30 y.o. [id number] PPD#1, past medical history significant for gHTN and PP depression s/p SVD. Complaining of 5/10 body aches and some light-headedness, received tylenol this AM but has not helped much. Passage of small clots, bleeding has decreased since yesterday. Pt is voiding, tolerating PO, and has had two bowel movement. Walking to restroom with assistance from husband. Plans on breastfeeding. Plans for nexplanon. Discussion and physical exam took place with Spanish interpretor.   Objective: Blood pressure 116/69, pulse 74, temperature 98.2 F (36.8 C), temperature source Oral, resp. rate 18, height 4\' 10"  (1.473 m), weight 84.823 kg (187 lb), last menstrual period 11/29/2014, SpO2 99 %, unknown if currently breastfeeding.  Physical Exam:  General: cooperative and fatigued  CV: RRR Resp: CTA Lochia: appropriate Uterine Fundus: firm, U-1 DVT Evaluation: No evidence of DVT seen on physical exam.Negative Homan's sign.No cords or calf tenderness.No significant calf/ankle edema.   Recent Labs  09/20/15 1955  HGB 13.4  HCT 37.4    Assessment/Plan: 1. Hx of gHTN - BP are normal and stable. 2. Hx of PP Depression - follow-up with mother in two weeks. Plan for discharge today.    LOS: 2 days   Ahmed Eloise HarmanFarawi 09/22/2015, 7:15 AM    CNM attestation:  I have seen and examined this patient; I agree with above documentation in the student's note. See my documentation below:  Pamela Alvarado is a 30 y.o. [id number] on PPD#1 following NSVD.  She is ambulating, tolerating PO, but does report dizziness when standing today. She also reports some difficulties with breastfeeding and infant latch.  PE: BP 116/69 mmHg  Pulse 74  Temp(Src) 98.2 F (36.8 C) (Oral)  Resp 18  Ht 4\' 10"  (1.473 m)  Wt 84.823 kg (187 lb)  BMI 39.09 kg/m2  SpO2 99%  LMP 11/29/2014  Breastfeeding? Unknown Gen: calm comfortable, NAD Resp: normal effort,  no distress Abd: soft, fundus -2, firm, lochia wnl  ROS, labs, PMH reviewed   Plan: Plan for d/c tomorrow per pt preference Breastfeeding, lactation in room during my evaluation and will continue to assist with latch  LEFTWICH-KIRBY, Jiles Goya, CNM 2:20 PM

## 2015-09-23 MED ORDER — IBUPROFEN 600 MG PO TABS: 600.0000 mg | ORAL_TABLET | Freq: Four times a day (QID) | ORAL | Status: DC | PRN

## 2015-09-23 NOTE — Progress Notes (Signed)
PPD#2 Subjective:  Lonny Prudelejandra Martinez Sixtega is 30 y.o. [id number] PPD#1, past medical history significant for gHTN and PP depression s/p SVD. Doing well this morning, light-headedness and dizziness from yesterday has ceased. No symptoms upon positional changes or ambulating. Has developed a cough, but denies fever, chills, or N/V. FOB also has cough. Bleeding has decreased since yesterday. Pt is voiding, tolerating PO, urinating, and has had BM. Plans on breastfeeding. Plans for nexplanon. Discussion and physical exam took place with remote iPad Spanish interpretor.  Objective: Blood pressure 137/78, pulse 68, temperature 97.9 F (36.6 C), temperature source Oral, resp. rate 16, height 4\' 10"  (1.473 m), weight 84.823 kg (187 lb), last menstrual period 11/29/2014, SpO2 99 %, breastfeeding.   Physical Exam:  General: cooperative  CV: RRR Resp: CTA Lochia: appropriate Uterine Fundus: firm, displaced to the right, U0 DVT Evaluation:No calf tenderness. No significant calf/ankle edema. Negative Homan's sign.No cords or calf tenderness.  Assessment/Plan: 1. Cough - OTC for cough  2. Hx of gHTN - BP are normal and stable. 3. Hx of PP Depression - seek care prior to 6 week check up if PP depression reoccurs.  Symptoms keeping patient in hospital have subsided. Plan for discharge today.

## 2015-09-23 NOTE — Discharge Summary (Signed)
OB Discharge Summary     Patient Name: Pamela Alvarado DOB: [birth year] MRN: [id number]  Date of admission: 09/20/2015 Delivering MD: Aviva Signs   Date of discharge: 09/23/2015  Admitting diagnosis: 40wks ctx  Intrauterine pregnancy: [redacted]w[redacted]d     Secondary diagnosis:  Active Problems:   Pregnancy   SVD (spontaneous vaginal delivery)  Additional problems: FHR variables; bilat otitis media     Discharge diagnosis: Term Pregnancy Delivered                                                                                                Post partum procedures:none  Augmentation: AROM and Pitocin  Complications: None  Hospital course:  Induction of Labor With Vaginal Delivery   30 y.o. yo [id number] at [redacted]w[redacted]d was admitted to the hospital 09/20/2015 for induction of labor.  Indication for induction: FHR variables.  Patient had an uncomplicated labor course as follows: Membrane Rupture Time/Date: 3:00 AM ,09/21/2015   Intrapartum Procedures: Episiotomy: None [1]                                         Lacerations:  None [1]  Patient had delivery of a Viable infant.  Information for the patient's newborn:  Darely, Becknell [[id number]]  Delivery Method: Vaginal, Spontaneous Delivery (Filed from Delivery Summary)   09/21/2015  Details of delivery can be found in separate delivery note.  Patient had a routine postpartum course. Patient is discharged home 09/23/2015.   Physical exam  Filed Vitals:   09/21/15 1902 09/22/15 0603 09/22/15 1845 09/23/15 0555  BP: 112/73 116/69 120/74 137/78  Pulse: 76 74 72 68  Temp: 97.4 F (36.3 C) 98.2 F (36.8 C) 98.1 F (36.7 C) 97.9 F (36.6 C)  TempSrc: Oral  Oral Oral  Resp: Height:      Weight:      SpO2:       General: alert and cooperative Lochia: appropriate Uterine Fundus: firm Incision: N/A DVT Evaluation: No evidence of DVT seen on physical exam. Labs: Lab Results  Component Value  Date   WBC 9.5 09/20/2015   HGB 13.4 09/20/2015   HCT 37.4 09/20/2015   MCV 84.0 09/20/2015   PLT 222 09/20/2015   CMP Latest Ref Rng 05/06/2010  Glucose 70 - 99 mg/dL -  BUN 6 - 23 mg/dL -  Creatinine 0.4 - 1.2 mg/dL -  Sodium 130 - 865 mEq/L -  Potassium 3.5 - 5.1 mEq/L -  Chloride 96 - 112 mEq/L -  CO2 19 - 32 mEq/L -  Calcium 8.4 - 10.5 mg/dL -  Total Protein 6.0 - 8.3 g/dL 7.8(I)  Total Bilirubin 0.3 - 1.2 mg/dL 0.9  Alkaline Phos 39 - 117 U/L 116  AST 0 - 37 U/L 195(H)  ALT 0 - 35 U/L 684(H)    Discharge instruction: per After Visit Summary and "Baby and Me Booklet".  After visit meds:    Medication List  TAKE these medications        cephALEXin 500 MG capsule  Commonly known as:  KEFLEX  Take 500 mg by mouth 3 (three) times daily.     ibuprofen 600 MG tablet  Commonly known as:  ADVIL,MOTRIN  Take 1 tablet (600 mg total) by mouth every 6 (six) hours as needed.     PRENATAL VITAMIN PO  Take 1 tablet by mouth daily.        Diet: routine diet  Activity: Advance as tolerated. Pelvic rest for 6 weeks.   Outpatient follow up:6 weeks Follow up Appt:No future appointments. Follow up Visit:No Follow-up on file.  Postpartum contraception: Nexplanon  Newborn Data: Live born female  Birth Weight: 7 lb 8.5 oz (3416 g) APGAR: 9, 9  Baby Feeding: Breast Disposition:home with mother   09/23/2015 Cam HaiSHAW, Deroy Noah, CNM  8:52 AM

## 2015-09-23 NOTE — Lactation Note (Signed)
This note was copied from a baby's chart. Lactation Consultation Note; experienced BF mom reports baby just finished feeding. Dad interpreted for me. Reports nipples are sore. Raw areas noted on both nipples. Comfort gels given with instructions for use. Encouraged to rub EBM into nipples after nursing. Dad giving formula by bottle. Encouraged to always breastfeed on both breasts. Has manual pump. No questions at present.   Patient Name: Pamela Alvarado ZOXWR'UToday's Date: 09/23/2015 Reason for consult: Follow-up assessment   Maternal Data Formula Feeding for Exclusion: No  Feeding    LATCH Score/Interventions          Comfort (Breast/Nipple): Filling, red/small blisters or bruises, mild/mod discomfort  Problem noted: Mild/Moderate discomfort Interventions (Mild/moderate discomfort): Comfort gels;Hand expression        Lactation Tools Discussed/Used     Consult Status Consult Status: Complete    Pamelia HoitWeeks, Kacin Dancy D 09/23/2015, 10:07 AM

## 2015-09-23 NOTE — Discharge Instructions (Signed)

## 2015-09-25 ENCOUNTER — Inpatient Hospital Stay (HOSPITAL_COMMUNITY): Payer: Self-pay

## 2016-06-01 IMAGING — US US OB COMP LESS 14 WK
2 series · 13 of 28 positions shown · non-contrast
Comparison: Pelvic ultrasound performed 05/09/2013

CLINICAL DATA: Acute onset of vaginal bleeding and pelvic cramping.
Initial encounter.

EXAM:
OBSTETRIC <14 WK US AND TRANSVAGINAL OB US
TECHNIQUE: Both transabdominal and transvaginal ultrasound examinations were
performed for complete evaluation of the gestation as well as the
maternal uterus, adnexal regions, and pelvic cul-de-sac.
Transvaginal technique was performed to assess early pregnancy.

[Series 1: us ob comp less 14 wk · 1 of 2 slices shown (1 of 2)]
[im 2/2]
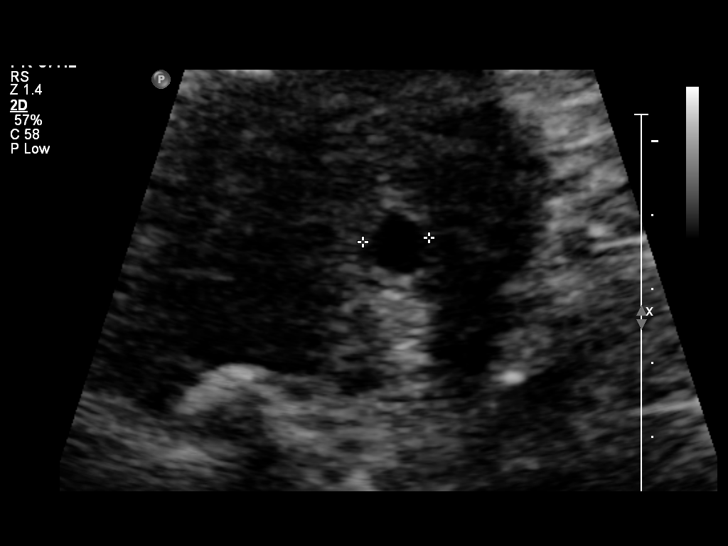

[Series 1: us ob comp less 14 wk · 12 of 35 slices shown (2 of 2)]
[im 2/35]
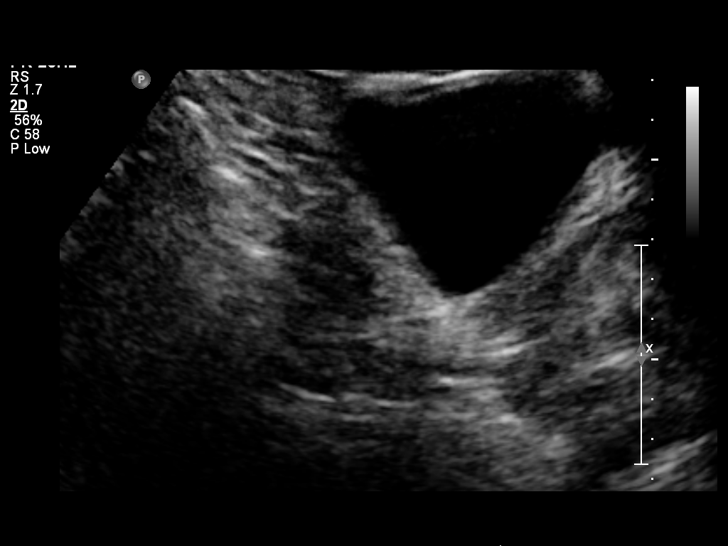
[im 5/35]
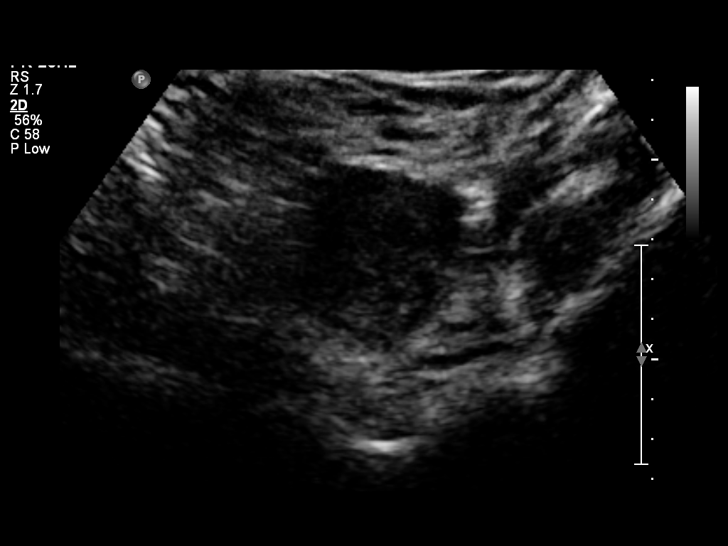
[im 7/35]
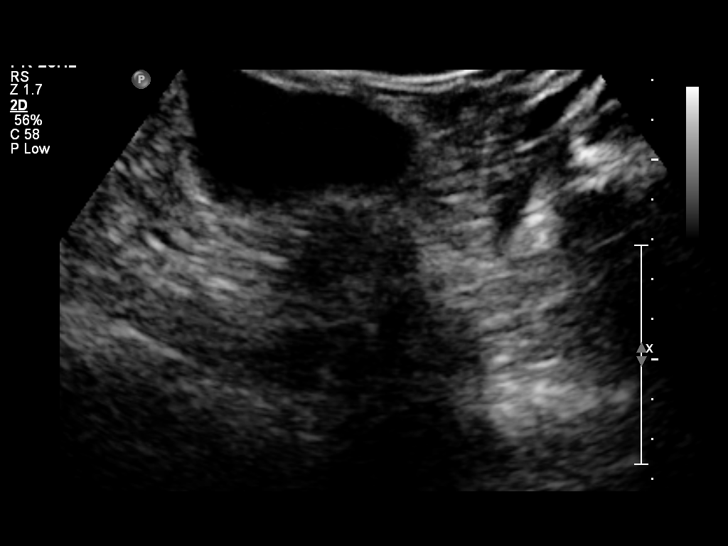
[im 10/35]
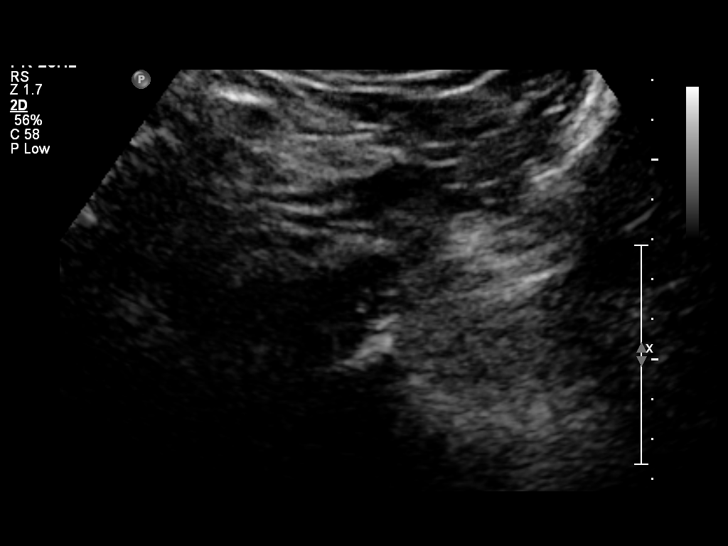
[im 13/35]
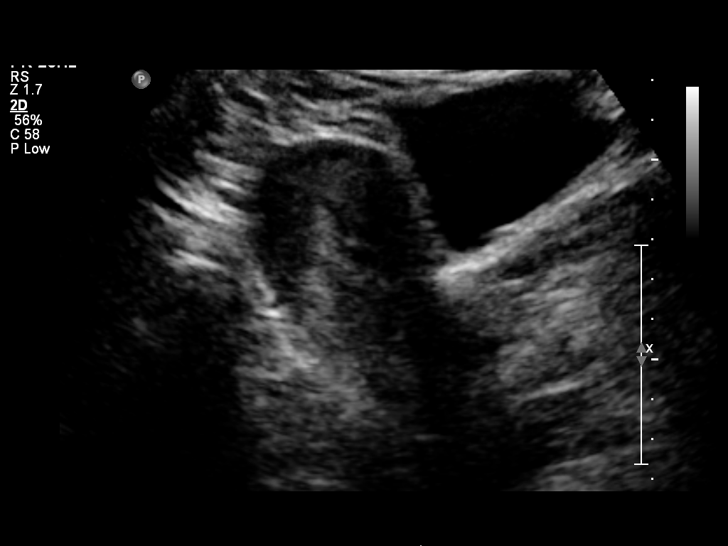
[im 17/35]
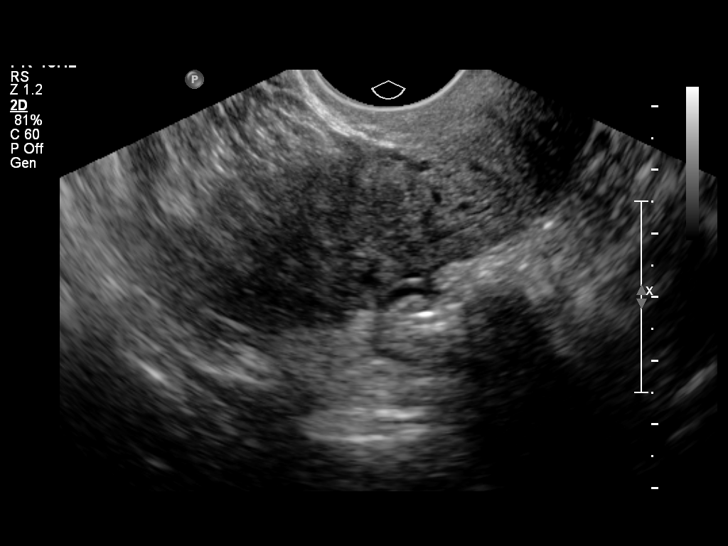
[im 20/35]
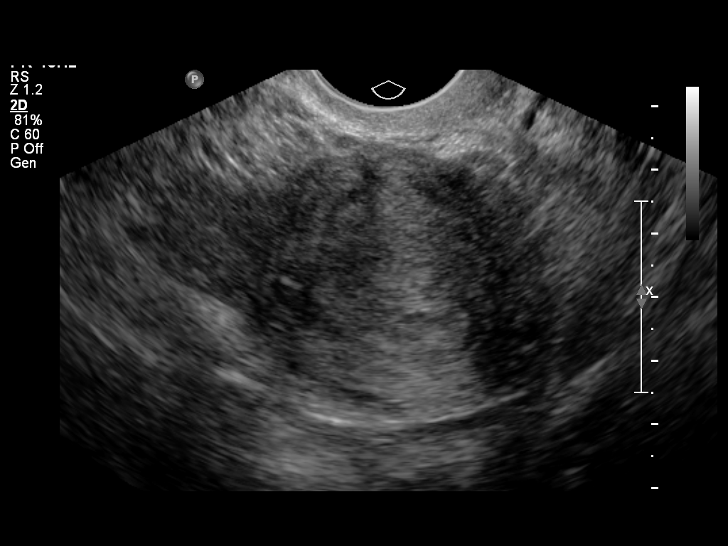
[im 22/35]
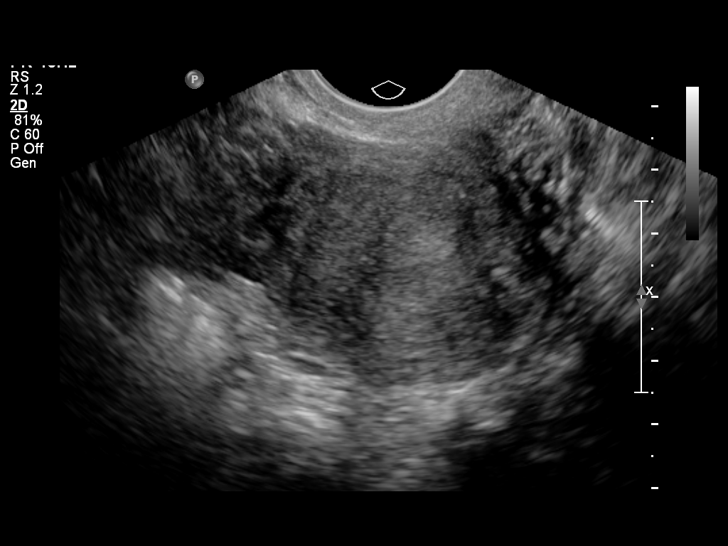
[im 25/35]
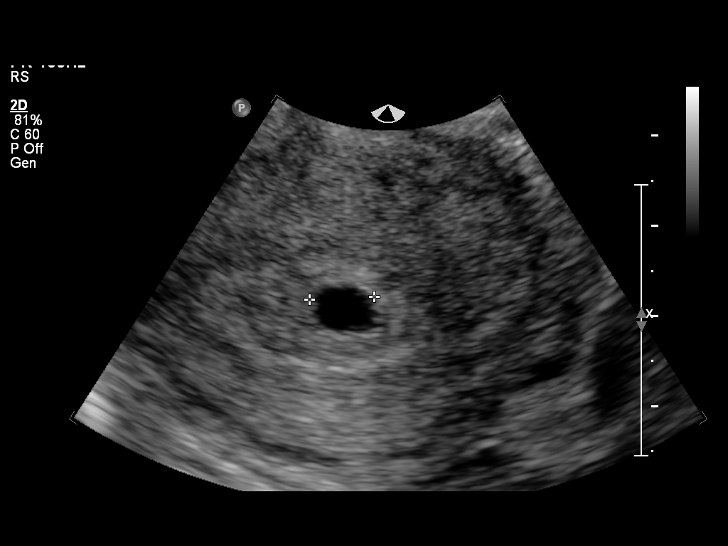
[im 28/35]
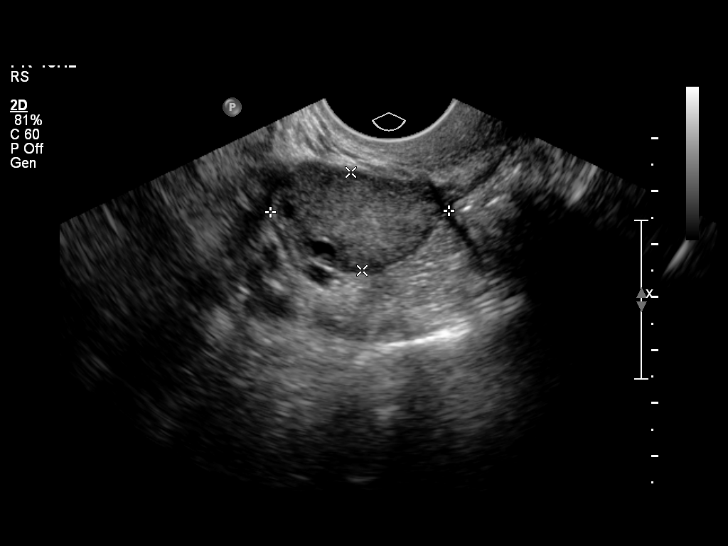
[im 30/35]
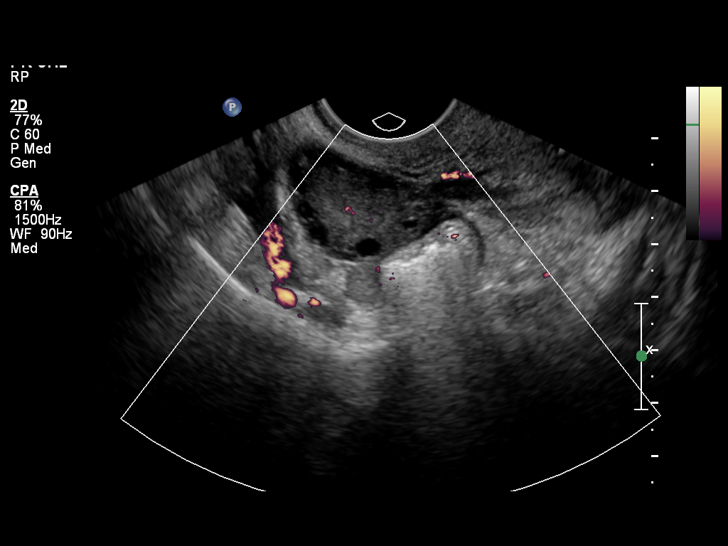
[im 33/35]
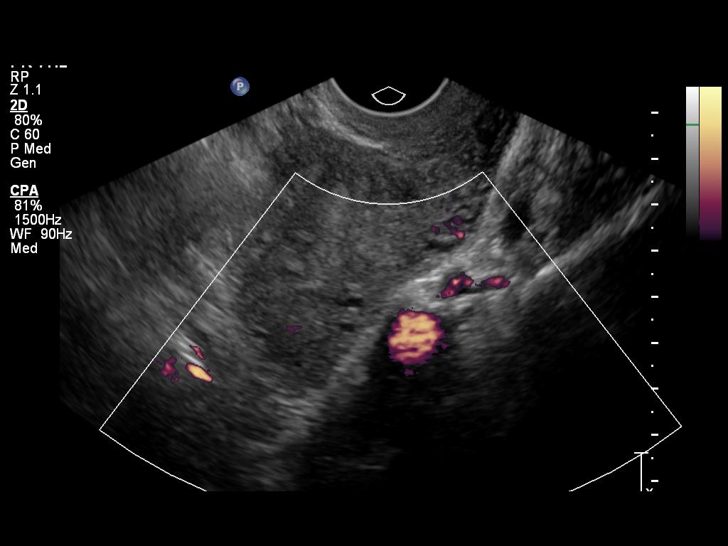

[13 of 28 positions shown; findings below may reference images not displayed]

FINDINGS: Intrauterine gestational sac: Visualized/normal in shape.

Yolk sac:  No

Embryo:  No

Cardiac Activity: N/A

MSD: 9.9 mm   5 w   5  d

Maternal uterus/adnexae: No subchorionic hemorrhage is noted. The
uterus is unremarkable in appearance.

The ovaries are within normal limits. The right ovary measures 3.4 x
1.9 x 2.8 cm, while the left ovary measures 3.2 x 2.2 x 2.1 cm. No
suspicious adnexal masses are seen; there is no evidence for ovarian
torsion.

No free fluid is seen within the pelvic cul-de-sac.
IMPRESSION: Single intrauterine gestational sac noted, with a mean sac diameter
of 1.0 cm, corresponding to a gestational age of 5 weeks 5 days.
This does not match the gestational age by LMP, though it remains
too early to establish an estimated date of delivery. No yolk sac or
embryo is yet seen, within normal limits. If the patient's
quantitative beta HCG continues to trend upward, follow-up pelvic
ultrasound could be considered in 2 weeks to assess for an embryo.

## 2016-11-03 IMAGING — US US MFM OB COMPLETE +14 WKS
1 series · 15 of 22 positions shown · non-contrast
Comparison: none

OBSTETRICS REPORT
(Signed Final 03/03/2015 [DATE])

Name:       SERINA NORTHINGTON                    Visit  03/03/2015 [DATE]
SHEKHAR                              Date:
Faculty Physician
Service(s) Provided
US MFM OB COMP LESS THAN 14 WEEKS                      76801.4
Indications
First trimester aneuploidy screen (NT)                 Z36
Uncertain LMP,  Establish Gestational Age              Z36
11 weeks gestation of pregnancy
Fetal Evaluation
Num Of             1
Fetuses:
Fetal Heart        157                          bpm
Rate:
Cardiac Activity:  Observed
Biometry
CRL:     49.2   m    G. Age:   11w 4d                  EDD:   09/18/15
m
Gestational Age
LMP:           13w 3d        Date:  11/29/14                  EDD:   09/05/15
Best:          11w 4d    Det. By:   U/S C R L (03/03/15)      EDD:   09/18/15
Cervix Uterus Adnexa
Cervix:       Normal appearance by transabdominal scan.
Appears closed, without funnelling.
Left Ovary:    Within normal limits.
Right Ovary:   Within normal limits.
Impression
INDICATION: 29 yr old 8ASHUHH at 51w1d by LMP for first
trimester screen.

[Series 1: us mfm ob complete +14 wks · 15 of 22 slices shown]
[im 1/22]
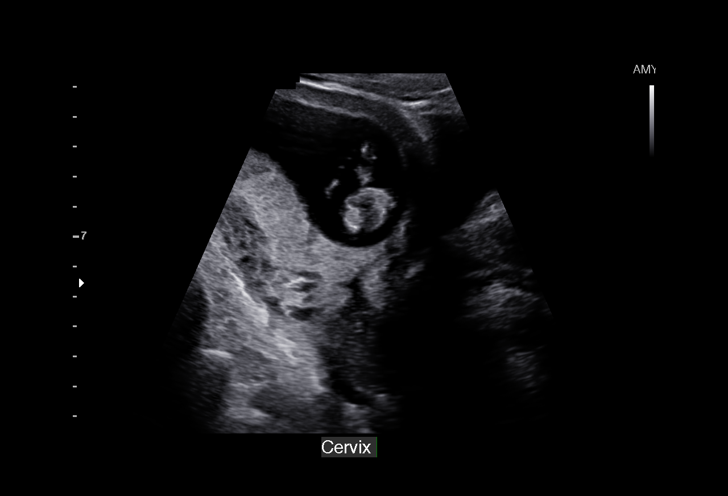
[im 3/22]
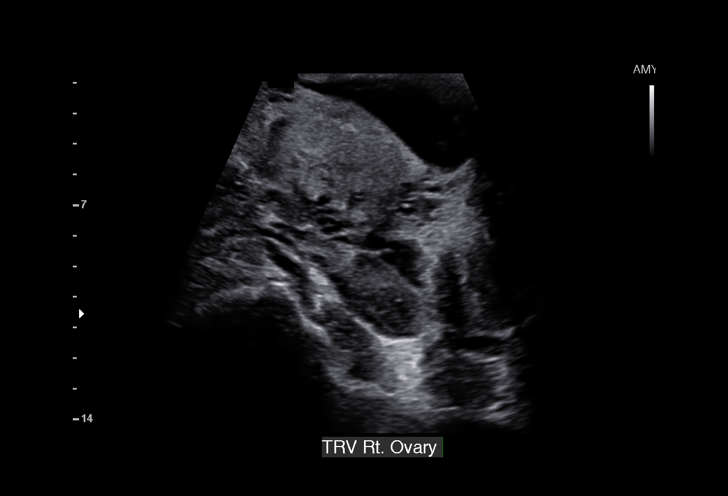
[im 4/22]
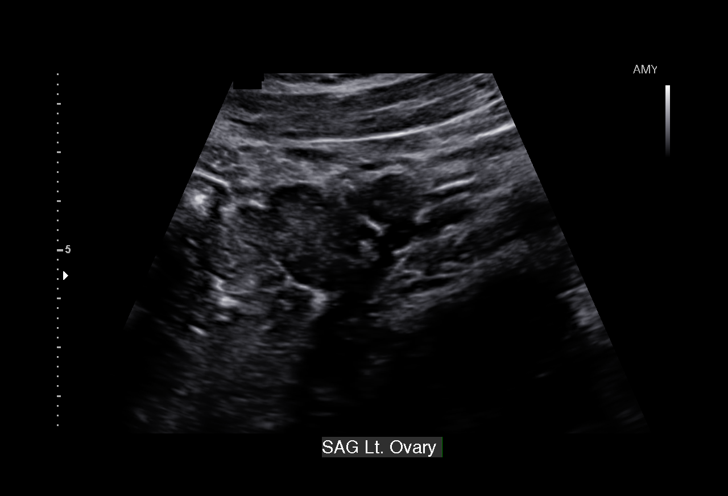
[im 6/22]
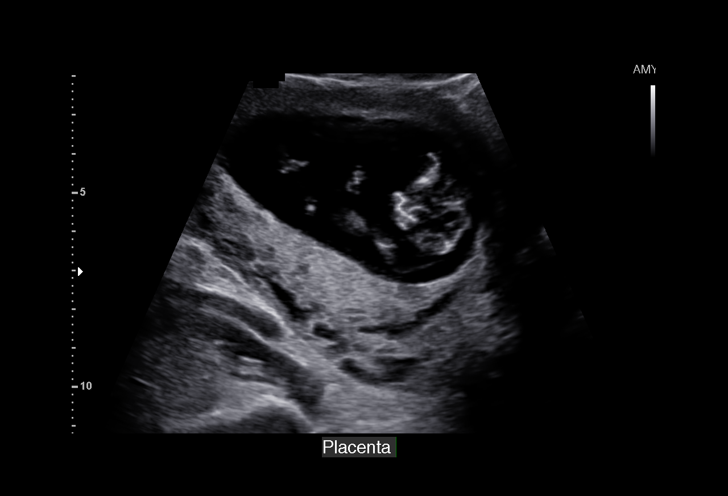
[im 7/22]
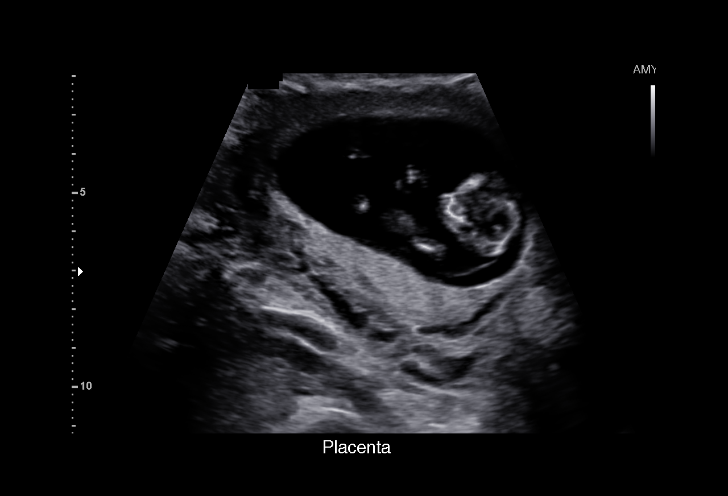
[im 9/22]
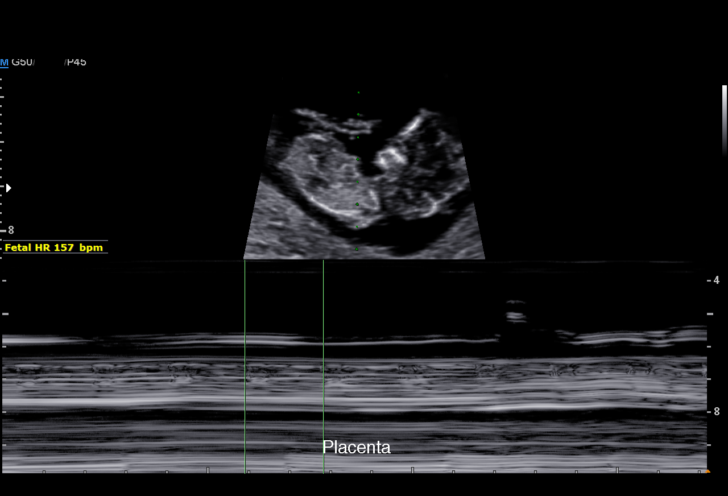
[im 10/22]
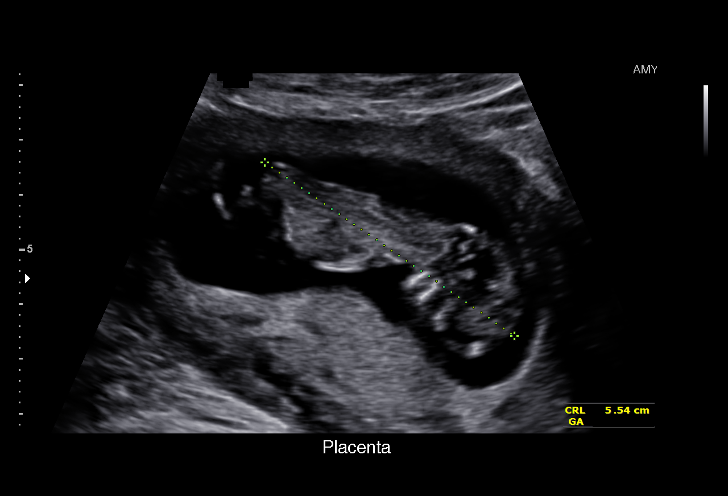
[im 12/22]
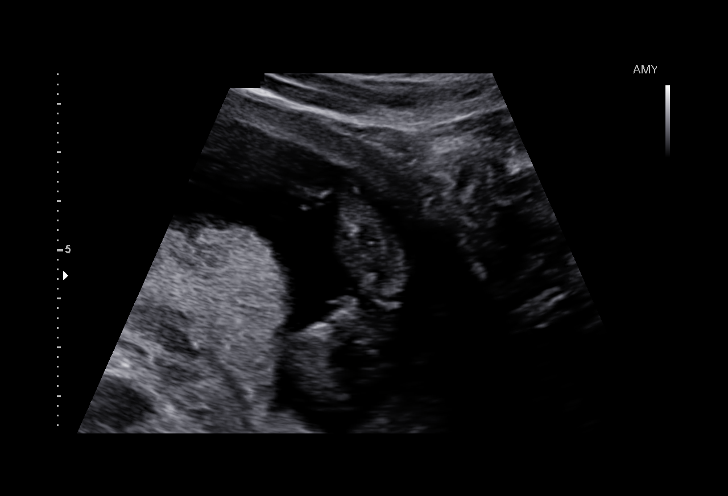
[im 13/22]
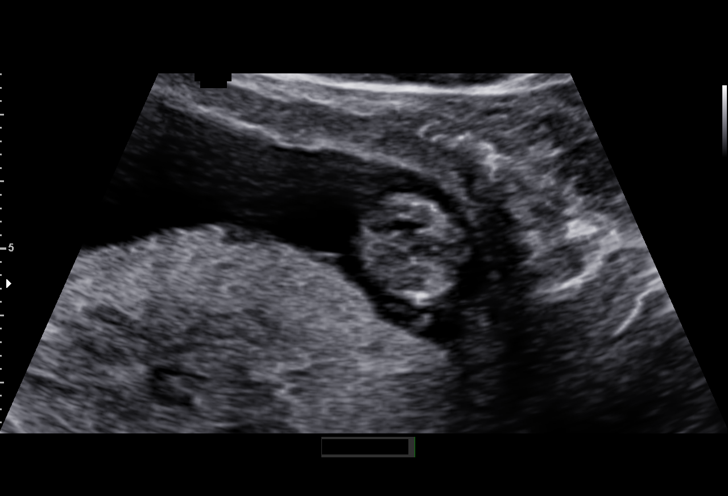
[im 14/22]
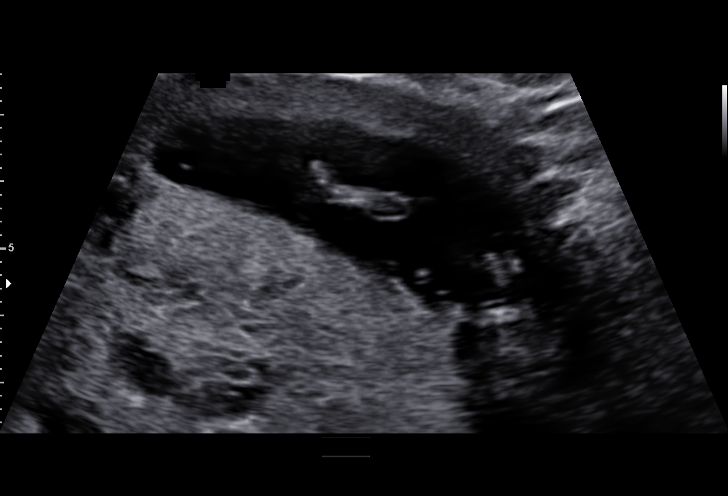
[im 16/22]
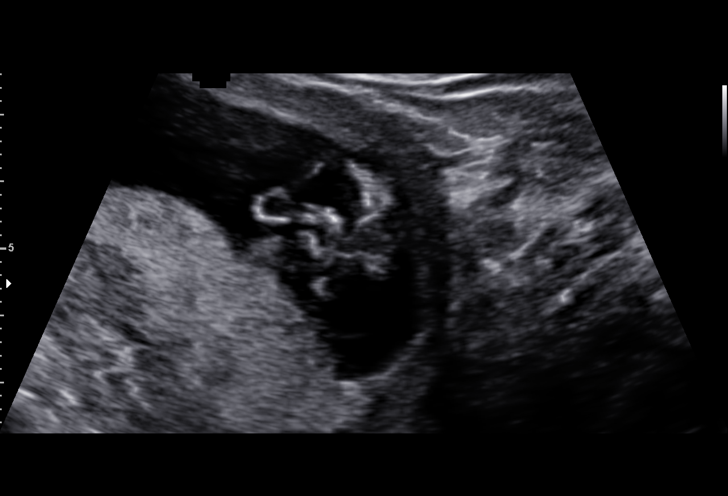
[im 17/22]
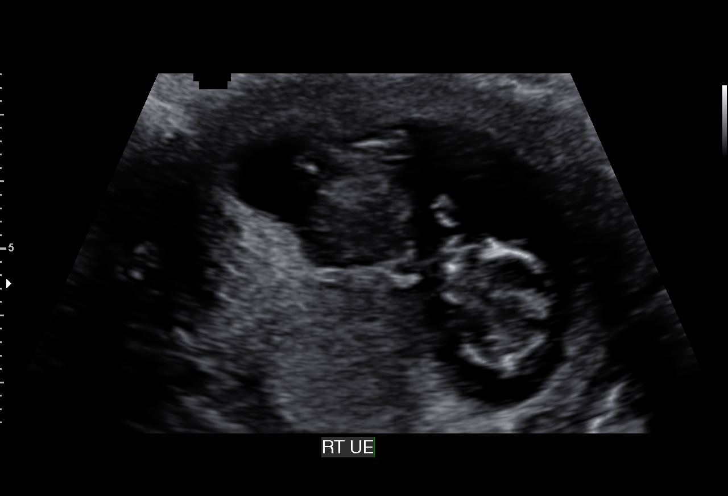
[im 19/22]
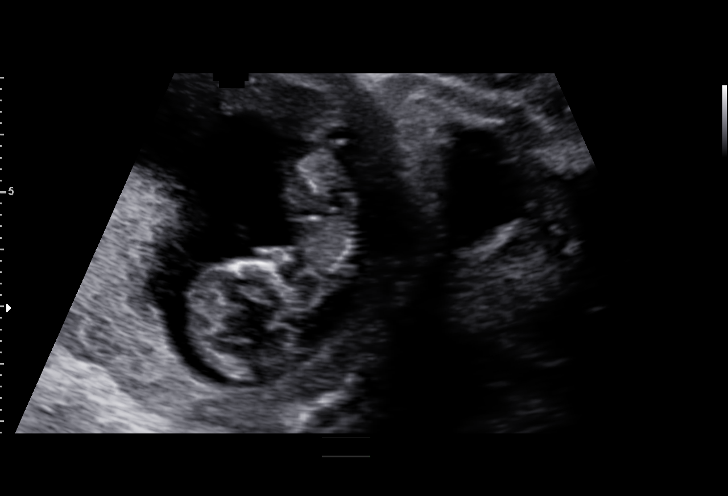
[im 20/22]
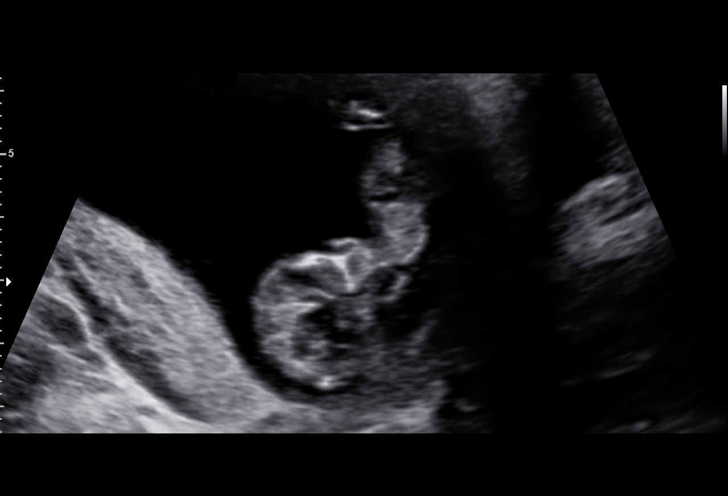
[im 22/22]
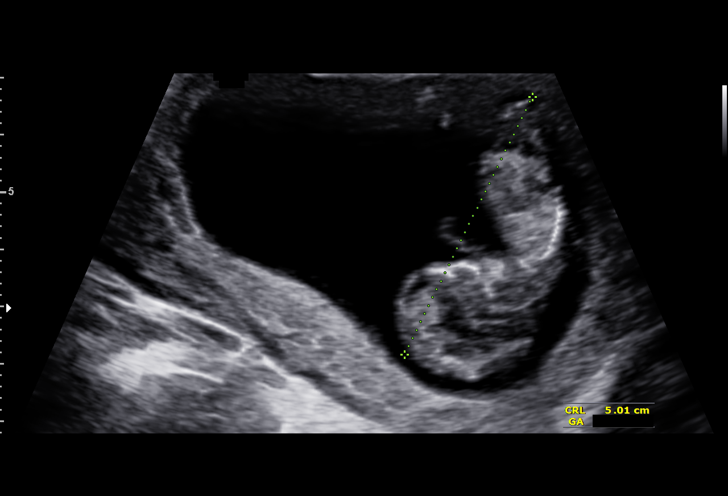

[15 of 22 positions shown; findings below may reference images not displayed]

FINDINGS: 1. Single intrauterine pregnancy.
2. Fetal crown rump length is consistent with 44w5d.
3. Normal uterus; no adnexal masses seen.
4. Evaluation of fetal anatomy is limited by early gestational
age.
5. Nuchal translucency could not be obtained due to fetal
position.
Recommendations

1. Based on today's ultrasound recommend using an
estimated due date of 09/18/14.
[DATE]. Nuchal translucency could not be obtained today:
- recommend follow up in 1 week to reattempt
3. Recommend maternal serum AFP at 15-20 weeks
4. Recommend fetal anatomic survey at 18-20 weeks

GULZHAZIRA STEPANOVA with us.  Please do not hesitate to

## 2019-10-28 ENCOUNTER — Ambulatory Visit: Payer: Self-pay | Attending: Internal Medicine

## 2019-10-28 DIAGNOSIS — Z23 Encounter for immunization: Secondary | ICD-10-CM

## 2019-10-28 NOTE — Progress Notes (Signed)
   Covid-19 Vaccination Clinic  Name:  Pamela Alvarado    MRN: [id number] DOB: 08-04-1985  10/28/2019  Ms. Pamela Alvarado was observed post Covid-19 immunization for 15 minutes without incident. She was provided with Vaccine Information Sheet and instruction to access the V-Safe system.   Ms. Pamela Alvarado was instructed to call 911 with any severe reactions post vaccine: Marland Kitchen Difficulty breathing  . Swelling of face and throat  . A fast heartbeat  . A bad rash all over body  . Dizziness and weakness   Immunizations Administered    Name Date Dose VIS Date Route   Pfizer COVID-19 Vaccine 10/28/2019 10:52 AM 0.3 mL 08/27/2018 Intramuscular   Manufacturer: [employer], [employer]   Lot: [id number]   NDC: [address]-4

## 2019-11-24 ENCOUNTER — Ambulatory Visit: Payer: Self-pay | Attending: Internal Medicine

## 2019-11-24 DIAGNOSIS — Z23 Encounter for immunization: Secondary | ICD-10-CM

## 2019-11-24 NOTE — Progress Notes (Signed)
Use translator service [phone]    Covid-19 Vaccination Clinic  Name:  Zadie Rhine    MRN: [id number] DOB: [birth year]  11/24/2019  Ms. Bradly Bienenstock was observed post Covid-19 immunization for 15 minutes without incident. She was provided with Vaccine Information Sheet and instruction to access the V-Safe system.   Ms. Bradly Bienenstock was instructed to call 911 with any severe reactions post vaccine: Marland Kitchen Difficulty breathing  . Swelling of face and throat  . A fast heartbeat  . A bad rash all over body  . Dizziness and weakness   Immunizations Administered    Name Date Dose VIS Date Route   Pfizer COVID-19 Vaccine 11/24/2019 10:50 AM 0.3 mL 08/27/2018 Intramuscular   Manufacturer: [employer], [employer]   Lot: [id number]   NDC: [address]-9

## 2020-02-21 ENCOUNTER — Other Ambulatory Visit: Payer: Self-pay

## 2020-02-21 DIAGNOSIS — I10 Essential (primary) hypertension: Secondary | ICD-10-CM | POA: Insufficient documentation

## 2020-02-21 DIAGNOSIS — Z79899 Other long term (current) drug therapy: Secondary | ICD-10-CM | POA: Insufficient documentation

## 2020-02-21 DIAGNOSIS — R519 Headache, unspecified: Secondary | ICD-10-CM | POA: Insufficient documentation

## 2020-02-21 DIAGNOSIS — H53149 Visual discomfort, unspecified: Secondary | ICD-10-CM | POA: Insufficient documentation

## 2020-02-22 ENCOUNTER — Emergency Department (HOSPITAL_COMMUNITY)
Admission: EM | Admit: 2020-02-22 | Discharge: 2020-02-22 | Disposition: A | Discharge status: Home / Self Care | Payer: Self-pay | Attending: Emergency Medicine | Admitting: Emergency Medicine

## 2020-02-22 ENCOUNTER — Encounter (HOSPITAL_COMMUNITY): Payer: Self-pay | Admitting: *Deleted

## 2020-02-22 ENCOUNTER — Other Ambulatory Visit: Payer: Self-pay

## 2020-02-22 DIAGNOSIS — R519 Headache, unspecified: Secondary | ICD-10-CM

## 2020-02-22 LAB — CBC WITH DIFFERENTIAL/PLATELET
Abs Immature Granulocytes: 0.04 10*3/uL (ref 0.00–0.07)
Basophils Absolute: 0 10*3/uL (ref 0.0–0.1)
Basophils Relative: 0 %
Eosinophils Absolute: 0.2 10*3/uL (ref 0.0–0.5)
Eosinophils Relative: 3 %
HCT: 44.4 % (ref 36.0–46.0)
Hemoglobin: 15.2 g/dL — ABNORMAL HIGH (ref 12.0–15.0)
Immature Granulocytes: 1 %
Lymphocytes Relative: 27 %
Lymphs Abs: 1.9 10*3/uL (ref 0.7–4.0)
MCH: 29.2 pg (ref 26.0–34.0)
MCHC: 34.2 g/dL (ref 30.0–36.0)
MCV: 85.4 fL (ref 80.0–100.0)
Monocytes Absolute: 0.4 10*3/uL (ref 0.1–1.0)
Monocytes Relative: 5 %
Neutro Abs: 4.6 10*3/uL (ref 1.7–7.7)
Neutrophils Relative %: 64 %
Platelets: 195 10*3/uL (ref 150–400)
RBC: 5.2 MIL/uL — ABNORMAL HIGH (ref 3.87–5.11)
RDW: 12 % (ref 11.5–15.5)
WBC: 7.1 10*3/uL (ref 4.0–10.5)
nRBC: 0 % (ref 0.0–0.2)

## 2020-02-22 LAB — BASIC METABOLIC PANEL
Anion gap: 9 (ref 5–15)
BUN: 9 mg/dL (ref 6–20)
CO2: 24 mmol/L (ref 22–32)
Calcium: 9 mg/dL (ref 8.9–10.3)
Chloride: 105 mmol/L (ref 98–111)
Creatinine, Ser: 0.48 mg/dL (ref 0.44–1.00)
GFR calc Af Amer: >60 mL/min (ref >60)
GFR calc non Af Amer: >60 mL/min (ref >60)
Glucose, Bld: 233 mg/dL — ABNORMAL HIGH (ref 70–99)
Potassium: 3.7 mmol/L (ref 3.5–5.1)
Sodium: 138 mmol/L (ref 135–145)

## 2020-02-22 MED ORDER — METOCLOPRAMIDE HCL 5 MG/ML IJ SOLN
5.0000 mg | Freq: Once | INTRAMUSCULAR | Status: AC
  Administered 2020-02-22: 5 mg via INTRAVENOUS
  Filled 2020-02-22: qty 2

## 2020-02-22 MED ORDER — DEXAMETHASONE SODIUM PHOSPHATE 10 MG/ML IJ SOLN
10.0000 mg | Freq: Once | INTRAMUSCULAR | Status: AC
  Administered 2020-02-22: 10 mg via INTRAVENOUS
  Filled 2020-02-22: qty 1

## 2020-02-22 MED ORDER — DIPHENHYDRAMINE HCL 50 MG/ML IJ SOLN: 25.0000 mg | Freq: Once | INTRAMUSCULAR | Status: DC

## 2020-02-22 MED ORDER — BUTALBITAL-APAP-CAFFEINE 50-325-40 MG PO TABS: 1.0000 | ORAL_TABLET | Freq: Four times a day (QID) | ORAL | 0 refills | Status: DC | PRN

## 2020-02-22 MED ORDER — SODIUM CHLORIDE 0.9 % IV BOLUS
500.0000 mL | Freq: Once | INTRAVENOUS | Status: AC
  Administered 2020-02-22: 500 mL via INTRAVENOUS

## 2020-02-22 MED ORDER — DIPHENHYDRAMINE HCL 50 MG/ML IJ SOLN
12.5000 mg | Freq: Once | INTRAMUSCULAR | Status: AC
  Administered 2020-02-22: 12.5 mg via INTRAVENOUS
  Filled 2020-02-22: qty 1

## 2020-02-22 MED ORDER — IBUPROFEN 200 MG PO TABS
600.0000 mg | ORAL_TABLET | Freq: Once | ORAL | Status: AC
  Administered 2020-02-22: 600 mg via ORAL
  Filled 2020-02-22: qty 1

## 2020-02-22 NOTE — Discharge Instructions (Addendum)
You were seen in the ED for headache  Lab work was normal  Your headache completely resolved after medicines  I suspect headache is from tension or migraine  Alternate 600 mg of ibuprofen and 500 mg of acetaminophen every 8 hours for headache.  Do not take these medicines for more than 1 week because and you can get a rebound headache.  If ibuprofen and acetaminophen are not helping, take Fioricet as prescribed.  Establish care with a primary care doctor for reevaluation of your headaches return or continue  I have given you contact information for headache specialist (neurologist)  Return to the ED for sudden severe headache, loss of vision, double vision, difficulty with speech, one-sided weakness paralysis or numbness, fevers or neck pain  Usted vino a la sala de emergencias por dolor de cabeza  Analysis de sangre estan normales  Su dolor de cabeza se resolvi por completo despus de los medicamentos.  Sospecho que el dolor de cabeza es por tensin o [place].  Alterne 600 mg de ibuprofeno y 500 mg de acetaminofeno cada 8 horas para el dolor de [place]. No tome estos medicamentos durante ms de 1 semana porque puede sufrir un dolor de cabeza de rebote. Si el ibuprofeno y el acetaminofn no le ayudan, tome Fioricet segn lo recetado.  Establezca atencin con un mdico de atencin primaria para que la reevaluacin de sus dolores de cabeza regrese o contine  Le he dado la informacin de contacto del Glass blower/designer en dolor de cabeza (neurlogo)  Regrese al servicio de urgencias por dolor de cabeza repentino severo, prdida de visin, visin doble, dificultad para hablar, debilidad unilateral, parlisis o entumecimiento, fiebre o dolor de cuello

## 2020-02-22 NOTE — ED Notes (Signed)
Discharge teaching reviewed with pt. Pt verbalized understanding. 

## 2020-02-22 NOTE — ED Provider Notes (Signed)
[facility] EMERGENCY DEPARTMENT Provider Note   CSN: [id number] Arrival date & time: 02/21/20  2248     History Chief Complaint  Patient presents with  . Migraine    Pamela Alvarado is a 34 y.o. female with no reported past medical history presents to the ED for evaluation of generalized weakness for the last 3 weeks described as feeling weak in her knees when she tries to get up and walk.  She also feels like her hand grip is weaker specifically in her thumbs bilaterally.  Developed gradual, initially mild right-sided head "pressure" 1 week ago that radiates to the right side of her face associated with light and noise sensitivity.  States a few days ago the pain felt like a pulsating pain and like it was going to explode.  She has taken ibuprofen and acetaminophen and these provided mild and only temporary relief of her head pain.  States a few days ago the sensitivity to light was so bad that she tried to text her boss to tell her she can come in but can only see very well.  She denies any head trauma or falls.  No anticoagulants.  Denies significant previous headache history.  Denies associated double vision, loss of vision or blurred vision.  No nausea or vomiting.  No neck pain or stiffness.  No fever.  No unilateral numbness or weakness or paralysis.  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  Denies recent URI.  Patient has been waiting in the ED WR for 15 hr states because of air conditioning her throat feels dry/scratchy but not painful. Fully COVID vaccinated.  Denies sinus congestion, cough, Cp, Sob, vomiting, diarrhea, abdominal pain or other COVID symptoms.   HPI     Past Medical History:  Diagnosis Date  . Hypertension   . Ovarian cyst 2004   in [place]    Patient Active Problem List   Diagnosis Date Noted  . SVD (spontaneous vaginal delivery) 09/21/2015  . Pregnancy 09/20/2015    Past Surgical  History:  Procedure Laterality Date  . CHOLECYSTECTOMY, LAPAROSCOPIC    . NO PAST SURGERIES       OB History    Gravida  5   Para  3   Term  3   Preterm  0   AB  2   Living  3     SAB  2   TAB  0   Ectopic  0   Multiple  0   Live Births  3           Family History  Problem Relation Age of Onset  . Diabetes Paternal Grandmother   . Diabetes Paternal Grandfather   . Diabetes Maternal Grandmother   . Tuberculosis Maternal Grandfather     Social History   Tobacco Use  . Smoking status: Never Smoker  . Smokeless tobacco: Never Used  Substance Use Topics  . Alcohol use: No  . Drug use: No    Home Medications Prior to Admission medications   Medication Sig Start Date End Date Taking? Authorizing Provider  butalbital-acetaminophen-caffeine (FIORICET) [phone] MG tablet Take 1-2 tablets by mouth every 6 (six) hours as needed for headache. 02/22/20 02/21/21  Liberty Handy, PA-C  cephALEXin (KEFLEX) 500 MG capsule Take 500 mg by mouth 3 (three) times daily. 09/17/15   [provider]  ibuprofen (ADVIL,MOTRIN) 600 MG tablet Take 1 tablet (600 mg total) by mouth  every 6 (six) hours as needed. 09/23/15   Arabella MerlesShaw, Kimberly D, CNM  Prenatal Vit-Fe Fumarate-FA (PRENATAL VITAMIN PO) Take 1 tablet by mouth daily.     [provider]    Allergies    Patient has no known allergies.  Review of Systems   Review of Systems  Eyes: Positive for photophobia.  Neurological: Positive for weakness and headaches.       Phonophobia   All other systems reviewed and are negative.   Physical Exam Updated Vital Signs BP 97/65   Pulse (!) 56   Temp 97.6 F (36.4 C) (Oral)   Resp 16   Ht 4\' 10"  (1.473 m)   Wt 84.8 kg   SpO2 98%   BMI 39.07 kg/m   Physical Exam Vitals and nursing note reviewed.  Constitutional:      General: She is not in acute distress.    Appearance: She is well-developed.     Comments: NAD.  HENT:     Head: Normocephalic and  atraumatic.     Comments: Diffuse right temporal, parietal scalp tenderness.  No obvious skin or hair abnormalities.  No contusions.    Right Ear: External ear normal.     Left Ear: External ear normal.     Nose: Nose normal.  Eyes:     General: No scleral icterus.    Conjunctiva/sclera: Conjunctivae normal.  Neck:     Comments: Mild right-sided occipital/muscular tenderness.  Pain reproduced with neck bend and rotation.  No midline tenderness.  No meningismus, full neck flexion without significant pain.  No carotid bruits. Cardiovascular:     Rate and Rhythm: Normal rate and regular rhythm.     Heart sounds: Normal heart sounds.  Pulmonary:     Effort: Pulmonary effort is normal.     Breath sounds: Normal breath sounds.  Musculoskeletal:        General: Normal range of motion.     Cervical back: Normal range of motion and neck supple. Tenderness present.  Skin:    General: Skin is warm and dry.     Capillary Refill: Capillary refill takes less than 2 seconds.  Neurological:     Mental Status: She is alert and oriented to person, place, and time.     Comments:  Mental Status: Patient is awake, alert, oriented to person, place, year, and situation. Patient is able to give a clear and coherent history.  Speech is fluent and clear without dysarthria or aphasia. No signs of neglect.  Cranial Nerves: I not tested II visual fields full bilaterally. PERRL.  Unable to visualize posterior eye. III, IV, VI EOMs intact without ptosis or diplopia  V sensation to light touch intact in all 3 divisions of trigeminal nerve bilaterally  VII facial movements symmetric bilaterally VIII hearing intact to voice/conversation  IX, X no uvula deviation, symmetric rise of soft palate/uvula XI 5/5 SCM and trapezius strength bilaterally  XII tongue protrusion midline, symmetric L/R movements  Motor: Strength 5/5 in upper/lower extremities .   Sensation to light touch intact in face, upper/lower  extremities. No pronator drift. No leg drop.  Cerebellar: No ataxia with finger to nose.   Psychiatric:        Behavior: Behavior normal.        Thought Content: Thought content normal.        Judgment: Judgment normal.     ED Results / Procedures / Treatments   Labs (all labs ordered are listed, but only abnormal results are  displayed) Labs Reviewed  CBC WITH DIFFERENTIAL/PLATELET - Abnormal; Notable for the following components:      Result Value   RBC 5.20 (*)    Hemoglobin 15.2 (*)    All other components within normal limits  BASIC METABOLIC PANEL - Abnormal; Notable for the following components:   Glucose, Bld 233 (*)    All other components within normal limits  I-STAT BETA HCG BLOOD, ED ([facility], [facility], [facility] ONLY)    EKG None  Radiology No results found.  Procedures Procedures (including critical care time)  Medications Ordered in ED Medications  sodium chloride 0.9 % bolus 500 mL ( Intravenous Rate/Dose Verify 02/22/20 1450)  metoCLOPramide (REGLAN) injection 5 mg (5 mg Intravenous Given 02/22/20 1419)  ibuprofen (ADVIL) tablet 600 mg (600 mg Oral Given 02/22/20 1421)  dexamethasone (DECADRON) injection 10 mg (10 mg Intravenous Given 02/22/20 1420)  diphenhydrAMINE (BENADRYL) injection 12.5 mg (12.5 mg Intravenous Given 02/22/20 1416)    ED Course  I have reviewed the triage vital signs and the nursing notes.  Pertinent labs & imaging results that were available during my care of the patient were reviewed by me and considered in my medical decision making (see chart for details).    MDM Rules/Calculators/A&P                           This patient complains of gradual onset right-sided headache and facial pain for 1 week preceded by what she describes as weakness in her knees with walking and weakness in her hands.  No red flags like anticoagulant use, head trauma, fever, neck stiffness, stroke symptoms or URI symptoms.  I obtained additional history from triage,  nursing notes and review of medical chart.  Previous medical records available, nursing notes reviewed to obtain more history and assist with MDM  The differential diagnosis includes tension type headache, migraine.  She has normal neuro exam.  Afebrile.  No meningismus.  She is young.  Doubt intracranial life-threatening process like acute bleed, stroke.  Doubt meningitis.  Doubt vascular emergency or dissection.  She has no true weakness on exam here.  MS is unlikely to be causing headache.  I have ordered lab work including CBC, BMP given her report of "weakness" that is quite vague and nonspecific.  I have personally visualized and interpreted labs and imaging.    ER work up reveals mild hyperglycemia without anion gap.  Hemoglobin 15.2, hematocrit normal.  No leukocytosis.    I ordered medications including ibuprofen, Decadron, Reglan and Benadryl with small IV fluid bolus for headache.  Will plan for serial re-examinations.    I have re-evaluated the patient.    I discussed her lab findings.  Reports complete resolution of headache.  Given benign exam, clinical improvement I do not think further emergent lab work, imaging or admission is necessary.  Will discharge patient with oral hydration, rest and Fioricet as needed if ibuprofen/Tylenol not helping.  Discussed risk of NSAID rebound headache.  Recommended PCP follow-up.  Return precautions discussed.  She is comfortable with this plan.  Final Clinical Impression(s) / ED Diagnoses Final diagnoses:  Right-sided headache    Rx / DC Orders ED Discharge Orders         Ordered    butalbital-acetaminophen-caffeine (FIORICET) [phone] MG tablet  Every 6 hours PRN        02/22/20 1540           Liberty Handy, [place] 02/22/20  1541    Rolan Bucco, MD 02/22/20 [phone]

## 2020-02-22 NOTE — ED Triage Notes (Signed)
To ED for eval of HA and sinus pressure for 10 days. Denies HA. Photophobia. Alert and oriented.

## 2020-08-20 ENCOUNTER — Encounter (HOSPITAL_COMMUNITY)
Admission: EM | Disposition: A | Discharge status: Home / Self Care | Payer: Self-pay | Source: Home / Self Care | Attending: Orthopedic Surgery

## 2020-08-20 ENCOUNTER — Emergency Department (HOSPITAL_COMMUNITY): Payer: No Typology Code available for payment source

## 2020-08-20 ENCOUNTER — Encounter (HOSPITAL_COMMUNITY): Payer: Self-pay | Admitting: Emergency Medicine

## 2020-08-20 ENCOUNTER — Emergency Department (HOSPITAL_COMMUNITY): Payer: No Typology Code available for payment source | Admitting: Certified Registered"

## 2020-08-20 ENCOUNTER — Other Ambulatory Visit: Payer: Self-pay

## 2020-08-20 ENCOUNTER — Inpatient Hospital Stay (HOSPITAL_COMMUNITY)
Admission: EM | Admit: 2020-08-20 | Discharge: 2020-08-21 | DRG: 581 | Disposition: A | Discharge status: Home / Self Care | Payer: No Typology Code available for payment source | Attending: Orthopedic Surgery | Admitting: Orthopedic Surgery

## 2020-08-20 DIAGNOSIS — Z23 Encounter for immunization: Secondary | ICD-10-CM | POA: Diagnosis not present

## 2020-08-20 DIAGNOSIS — S3991XA Unspecified injury of abdomen, initial encounter: Secondary | ICD-10-CM

## 2020-08-20 DIAGNOSIS — Z833 Family history of diabetes mellitus: Secondary | ICD-10-CM

## 2020-08-20 DIAGNOSIS — S81011A Laceration without foreign body, right knee, initial encounter: Principal | ICD-10-CM | POA: Diagnosis present

## 2020-08-20 DIAGNOSIS — S81819A Laceration without foreign body, unspecified lower leg, initial encounter: Secondary | ICD-10-CM | POA: Diagnosis present

## 2020-08-20 DIAGNOSIS — T1490XA Injury, unspecified, initial encounter: Secondary | ICD-10-CM

## 2020-08-20 DIAGNOSIS — Z79899 Other long term (current) drug therapy: Secondary | ICD-10-CM

## 2020-08-20 DIAGNOSIS — S81811A Laceration without foreign body, right lower leg, initial encounter: Secondary | ICD-10-CM

## 2020-08-20 DIAGNOSIS — Z20822 Contact with and (suspected) exposure to covid-19: Secondary | ICD-10-CM | POA: Diagnosis present

## 2020-08-20 DIAGNOSIS — Z9049 Acquired absence of other specified parts of digestive tract: Secondary | ICD-10-CM

## 2020-08-20 DIAGNOSIS — I10 Essential (primary) hypertension: Secondary | ICD-10-CM | POA: Diagnosis present

## 2020-08-20 HISTORY — PX: I & D EXTREMITY: SHX5045

## 2020-08-20 HISTORY — DX: Laceration without foreign body, unspecified lower leg, initial encounter: S81.819A

## 2020-08-20 LAB — COMPREHENSIVE METABOLIC PANEL
ALT: 53 U/L — ABNORMAL HIGH (ref 0–44)
AST: 38 U/L (ref 15–41)
Albumin: 4 g/dL (ref 3.5–5.0)
Alkaline Phosphatase: 151 U/L — ABNORMAL HIGH (ref 38–126)
Anion gap: 15 (ref 5–15)
BUN: 12 mg/dL (ref 6–20)
CO2: 22 mmol/L (ref 22–32)
Calcium: 9.2 mg/dL (ref 8.9–10.3)
Chloride: 98 mmol/L (ref 98–111)
Creatinine, Ser: 0.79 mg/dL (ref 0.44–1.00)
GFR, Estimated: >60 mL/min (ref >60)
Glucose, Bld: 393 mg/dL — ABNORMAL HIGH (ref 70–99)
Potassium: 3.1 mmol/L — ABNORMAL LOW (ref 3.5–5.1)
Sodium: 135 mmol/L (ref 135–145)
Total Bilirubin: 1.8 mg/dL — ABNORMAL HIGH (ref 0.3–1.2)
Total Protein: 7.4 g/dL (ref 6.5–8.1)

## 2020-08-20 LAB — I-STAT BETA HCG BLOOD, ED (MC, WL, AP ONLY): I-stat hCG, quantitative: <5 m[IU]/mL (ref <5)

## 2020-08-20 LAB — I-STAT CHEM 8, ED
BUN: 15 mg/dL (ref 6–20)
Calcium, Ion: 1.04 mmol/L — ABNORMAL LOW (ref 1.15–1.40)
Chloride: 99 mmol/L (ref 98–111)
Creatinine, Ser: 0.6 mg/dL (ref 0.44–1.00)
Glucose, Bld: 397 mg/dL — ABNORMAL HIGH (ref 70–99)
HCT: 43 % (ref 36.0–46.0)
Hemoglobin: 14.6 g/dL (ref 12.0–15.0)
Potassium: 3 mmol/L — ABNORMAL LOW (ref 3.5–5.1)
Sodium: 136 mmol/L (ref 135–145)
TCO2: 22 mmol/L (ref 22–32)

## 2020-08-20 LAB — PROTIME-INR
INR: 1 (ref 0.8–1.2)
Prothrombin Time: 12.6 seconds (ref 11.4–15.2)

## 2020-08-20 LAB — URINALYSIS, ROUTINE W REFLEX MICROSCOPIC
Bilirubin Urine: NEGATIVE
Glucose, UA: >=500 mg/dL — AB
Ketones, ur: 15 mg/dL — AB
Leukocytes,Ua: NEGATIVE
Nitrite: POSITIVE — AB
Protein, ur: NEGATIVE mg/dL
Specific Gravity, Urine: 1.01 (ref 1.005–1.030)
pH: 6 (ref 5.0–8.0)

## 2020-08-20 LAB — URINALYSIS, MICROSCOPIC (REFLEX)

## 2020-08-20 LAB — CBC
HCT: 45.6 % (ref 36.0–46.0)
Hemoglobin: 15.9 g/dL — ABNORMAL HIGH (ref 12.0–15.0)
MCH: 29.3 pg (ref 26.0–34.0)
MCHC: 34.9 g/dL (ref 30.0–36.0)
MCV: 84.1 fL (ref 80.0–100.0)
Platelets: 328 10*3/uL (ref 150–400)
RBC: 5.42 MIL/uL — ABNORMAL HIGH (ref 3.87–5.11)
RDW: 12 % (ref 11.5–15.5)
WBC: 18.2 10*3/uL — ABNORMAL HIGH (ref 4.0–10.5)
nRBC: 0 % (ref 0.0–0.2)

## 2020-08-20 LAB — SAMPLE TO BLOOD BANK

## 2020-08-20 LAB — RESP PANEL BY RT-PCR (FLU A&B, COVID) ARPGX2
Influenza A by PCR: NEGATIVE
Influenza B by PCR: NEGATIVE
SARS Coronavirus 2 by RT PCR: NEGATIVE

## 2020-08-20 LAB — LACTIC ACID, PLASMA: Lactic Acid, Venous: 4.8 mmol/L (ref 0.5–1.9)

## 2020-08-20 LAB — ETHANOL: Alcohol, Ethyl (B): <10 mg/dL (ref <10)

## 2020-08-20 SURGERY — IRRIGATION AND DEBRIDEMENT EXTREMITY
Anesthesia: General | Site: Leg Upper | Laterality: Right

## 2020-08-20 MED ORDER — HYDROMORPHONE HCL 1 MG/ML IJ SOLN
0.2500 mg | INTRAMUSCULAR | Status: DC | PRN
  Administered 2020-08-20: 0.5 mg via INTRAVENOUS

## 2020-08-20 MED ORDER — FENTANYL CITRATE (PF) 100 MCG/2ML IJ SOLN
INTRAMUSCULAR | Status: DC | PRN
  Administered 2020-08-20 (×2): 25 ug via INTRAVENOUS
  Administered 2020-08-20 (×2): 50 ug via INTRAVENOUS
  Administered 2020-08-20: 25 ug via INTRAVENOUS

## 2020-08-20 MED ORDER — PROPOFOL 10 MG/ML IV BOLUS
INTRAVENOUS | Status: AC
  Filled 2020-08-20: qty 20

## 2020-08-20 MED ORDER — LACTATED RINGERS IV SOLN: INTRAVENOUS | Status: DC

## 2020-08-20 MED ORDER — ROCURONIUM BROMIDE 10 MG/ML (PF) SYRINGE
PREFILLED_SYRINGE | INTRAVENOUS | Status: DC | PRN
  Administered 2020-08-20: 30 mg via INTRAVENOUS

## 2020-08-20 MED ORDER — SODIUM CHLORIDE 0.9 % IR SOLN
Status: DC | PRN
  Administered 2020-08-20: 3000 mL

## 2020-08-20 MED ORDER — DEXAMETHASONE SODIUM PHOSPHATE 10 MG/ML IJ SOLN
INTRAMUSCULAR | Status: DC | PRN
  Administered 2020-08-20: 10 mg via INTRAVENOUS

## 2020-08-20 MED ORDER — CEFAZOLIN SODIUM-DEXTROSE 2-4 GM/100ML-% IV SOLN
2.0000 g | Freq: Once | INTRAVENOUS | Status: AC
  Administered 2020-08-20: 2 g via INTRAVENOUS
  Filled 2020-08-20: qty 100

## 2020-08-20 MED ORDER — CEFAZOLIN SODIUM-DEXTROSE 1-4 GM/50ML-% IV SOLN
INTRAVENOUS | Status: AC
  Filled 2020-08-20: qty 50

## 2020-08-20 MED ORDER — 0.9 % SODIUM CHLORIDE (POUR BTL) OPTIME
TOPICAL | Status: DC | PRN
  Administered 2020-08-20: 1000 mL

## 2020-08-20 MED ORDER — PHENYLEPHRINE HCL-NACL 10-0.9 MG/250ML-% IV SOLN
INTRAVENOUS | Status: DC | PRN
  Administered 2020-08-20: 25 ug/min via INTRAVENOUS

## 2020-08-20 MED ORDER — IOHEXOL 350 MG/ML SOLN
100.0000 mL | Freq: Once | INTRAVENOUS | Status: AC | PRN
  Administered 2020-08-20: 100 mL via INTRAVENOUS

## 2020-08-20 MED ORDER — BUPIVACAINE HCL (PF) 0.25 % IJ SOLN
INTRAMUSCULAR | Status: AC
  Filled 2020-08-20: qty 30

## 2020-08-20 MED ORDER — VANCOMYCIN HCL 1000 MG IV SOLR
INTRAVENOUS | Status: AC
  Filled 2020-08-20: qty 1000

## 2020-08-20 MED ORDER — LIDOCAINE 2% (20 MG/ML) 5 ML SYRINGE
INTRAMUSCULAR | Status: DC | PRN
  Administered 2020-08-20: 60 mg via INTRAVENOUS

## 2020-08-20 MED ORDER — MIDAZOLAM HCL 5 MG/5ML IJ SOLN
INTRAMUSCULAR | Status: DC | PRN
  Administered 2020-08-20 (×2): 1 mg via INTRAVENOUS

## 2020-08-20 MED ORDER — HYDROMORPHONE HCL 1 MG/ML IJ SOLN
INTRAMUSCULAR | Status: AC
  Filled 2020-08-20: qty 1

## 2020-08-20 MED ORDER — SUGAMMADEX SODIUM 200 MG/2ML IV SOLN
INTRAVENOUS | Status: DC | PRN
  Administered 2020-08-20: 140 mg via INTRAVENOUS

## 2020-08-20 MED ORDER — FENTANYL CITRATE (PF) 100 MCG/2ML IJ SOLN
INTRAMUSCULAR | Status: AC | PRN
  Administered 2020-08-20: 50 ug via INTRAVENOUS

## 2020-08-20 MED ORDER — ACETAMINOPHEN 10 MG/ML IV SOLN
INTRAVENOUS | Status: AC
  Filled 2020-08-20: qty 100

## 2020-08-20 MED ORDER — CEFAZOLIN SODIUM-DEXTROSE 2-3 GM-%(50ML) IV SOLR
INTRAVENOUS | Status: DC | PRN
  Administered 2020-08-20: 2 g via INTRAVENOUS

## 2020-08-20 MED ORDER — ONDANSETRON HCL 4 MG/2ML IJ SOLN
INTRAMUSCULAR | Status: DC | PRN
  Administered 2020-08-20: 4 mg via INTRAVENOUS

## 2020-08-20 MED ORDER — LACTATED RINGERS IV SOLN: INTRAVENOUS | Status: DC | PRN

## 2020-08-20 MED ORDER — SUCCINYLCHOLINE CHLORIDE 20 MG/ML IJ SOLN
INTRAMUSCULAR | Status: DC | PRN
  Administered 2020-08-20: 100 mg via INTRAVENOUS

## 2020-08-20 MED ORDER — FENTANYL CITRATE (PF) 250 MCG/5ML IJ SOLN
INTRAMUSCULAR | Status: AC
  Filled 2020-08-20: qty 5

## 2020-08-20 MED ORDER — PROPOFOL 10 MG/ML IV BOLUS
INTRAVENOUS | Status: DC | PRN
  Administered 2020-08-20: 40 mg via INTRAVENOUS
  Administered 2020-08-20: 130 mg via INTRAVENOUS

## 2020-08-20 MED ORDER — ACETAMINOPHEN 10 MG/ML IV SOLN
INTRAVENOUS | Status: DC | PRN
  Administered 2020-08-20: 1000 mg via INTRAVENOUS

## 2020-08-20 MED ORDER — MIDAZOLAM HCL 2 MG/2ML IJ SOLN
INTRAMUSCULAR | Status: AC
  Filled 2020-08-20: qty 2

## 2020-08-20 MED ORDER — FENTANYL CITRATE (PF) 100 MCG/2ML IJ SOLN
50.0000 ug | Freq: Once | INTRAMUSCULAR | Status: DC
  Filled 2020-08-20: qty 2

## 2020-08-20 MED ORDER — TETANUS-DIPHTH-ACELL PERTUSSIS 5-2.5-18.5 LF-MCG/0.5 IM SUSY
0.5000 mL | PREFILLED_SYRINGE | Freq: Once | INTRAMUSCULAR | Status: AC
  Administered 2020-08-20: 0.5 mL via INTRAMUSCULAR
  Filled 2020-08-20: qty 0.5

## 2020-08-20 SURGICAL SUPPLY — 72 items
BANDAGE ESMARK 6X9 LF (GAUZE/BANDAGES/DRESSINGS) IMPLANT
BLADE SURG 10 STRL SS (BLADE) ×2 IMPLANT
BNDG CMPR 9X4 STRL LF SNTH (GAUZE/BANDAGES/DRESSINGS)
BNDG CMPR 9X6 STRL LF SNTH (GAUZE/BANDAGES/DRESSINGS)
BNDG CMPR MED 10X6 ELC LF (GAUZE/BANDAGES/DRESSINGS) ×1
BNDG CMPR MED 15X6 ELC VLCR LF (GAUZE/BANDAGES/DRESSINGS) ×1
BNDG COHESIVE 4X5 TAN STRL (GAUZE/BANDAGES/DRESSINGS) ×2 IMPLANT
BNDG ELASTIC 4X5.8 VLCR STR LF (GAUZE/BANDAGES/DRESSINGS) ×2 IMPLANT
BNDG ELASTIC 6X10 VLCR STRL LF (GAUZE/BANDAGES/DRESSINGS) ×1 IMPLANT
BNDG ELASTIC 6X15 VLCR STRL LF (GAUZE/BANDAGES/DRESSINGS) ×1 IMPLANT
BNDG ELASTIC 6X5.8 VLCR STR LF (GAUZE/BANDAGES/DRESSINGS) ×2 IMPLANT
BNDG ESMARK 4X9 LF (GAUZE/BANDAGES/DRESSINGS) IMPLANT
BNDG ESMARK 6X9 LF (GAUZE/BANDAGES/DRESSINGS)
BNDG GAUZE ELAST 4 BULKY (GAUZE/BANDAGES/DRESSINGS) ×2 IMPLANT
CNTNR URN SCR LID CUP LEK RST (MISCELLANEOUS) IMPLANT
CONT SPEC 4OZ STRL OR WHT (MISCELLANEOUS)
COVER SURGICAL LIGHT HANDLE (MISCELLANEOUS) ×2 IMPLANT
COVER WAND RF STERILE (DRAPES) ×2 IMPLANT
CUFF TOURN SGL LL 12 NO SLV (MISCELLANEOUS) IMPLANT
CUFF TOURN SGL QUICK 34 (TOURNIQUET CUFF)
CUFF TRNQT CYL 34X4.125X (TOURNIQUET CUFF) IMPLANT
DRAIN PENROSE 1/4X12 LTX STRL (WOUND CARE) ×1 IMPLANT
DRAPE SURG 17X23 STRL (DRAPES) IMPLANT
DRAPE U-SHAPE 47X51 STRL (DRAPES) IMPLANT
DRSG ADAPTIC 3X8 NADH LF (GAUZE/BANDAGES/DRESSINGS) ×3 IMPLANT
DRSG PAD ABDOMINAL 8X10 ST (GAUZE/BANDAGES/DRESSINGS) ×2 IMPLANT
DURAPREP 26ML APPLICATOR (WOUND CARE) ×2 IMPLANT
ELECT REM PT RETURN 9FT ADLT (ELECTROSURGICAL)
ELECTRODE REM PT RTRN 9FT ADLT (ELECTROSURGICAL) IMPLANT
EVACUATOR 1/8 PVC DRAIN (DRAIN) IMPLANT
FACESHIELD WRAPAROUND (MASK) ×2 IMPLANT
FACESHIELD WRAPAROUND OR TEAM (MASK) ×1 IMPLANT
GAUZE SPONGE 4X4 12PLY STRL (GAUZE/BANDAGES/DRESSINGS) ×2 IMPLANT
GAUZE XEROFORM 1X8 LF (GAUZE/BANDAGES/DRESSINGS) ×2 IMPLANT
GLOVE BIO SURGEON STRL SZ7.5 (GLOVE) ×2 IMPLANT
GLOVE BIOGEL PI IND STRL 7.5 (GLOVE) ×1 IMPLANT
GLOVE BIOGEL PI INDICATOR 7.5 (GLOVE) ×1
GLOVE SRG 8 PF TXTR STRL LF DI (GLOVE) ×1 IMPLANT
GLOVE SURG SYN 7.5  E (GLOVE) ×2
GLOVE SURG SYN 7.5 E (GLOVE) ×1 IMPLANT
GLOVE SURG SYN 7.5 PF PI (GLOVE) ×1 IMPLANT
GLOVE SURG UNDER POLY LF SZ8 (GLOVE) ×2
GOWN STRL REUS W/ TWL LRG LVL3 (GOWN DISPOSABLE) ×2 IMPLANT
GOWN STRL REUS W/ TWL XL LVL3 (GOWN DISPOSABLE) ×2 IMPLANT
GOWN STRL REUS W/TWL LRG LVL3 (GOWN DISPOSABLE) ×4
GOWN STRL REUS W/TWL XL LVL3 (GOWN DISPOSABLE) ×4
HANDPIECE INTERPULSE COAX TIP (DISPOSABLE)
IMMOBILIZER KNEE 20 (SOFTGOODS) ×2
IMMOBILIZER KNEE 20 THIGH 36 (SOFTGOODS) IMPLANT
KIT BASIN OR (CUSTOM PROCEDURE TRAY) ×2 IMPLANT
KIT TURNOVER KIT B (KITS) ×2 IMPLANT
MANIFOLD NEPTUNE II (INSTRUMENTS) ×2 IMPLANT
NDL HYPO 25GX1X1/2 BEV (NEEDLE) IMPLANT
NEEDLE HYPO 25GX1X1/2 BEV (NEEDLE) IMPLANT
NS IRRIG 1000ML POUR BTL (IV SOLUTION) ×2 IMPLANT
PACK ORTHO EXTREMITY (CUSTOM PROCEDURE TRAY) ×2 IMPLANT
PAD ABD 8X10 STRL (GAUZE/BANDAGES/DRESSINGS) ×3 IMPLANT
PAD ARMBOARD 7.5X6 YLW CONV (MISCELLANEOUS) ×4 IMPLANT
SET CYSTO W/LG BORE CLAMP LF (SET/KITS/TRAYS/PACK) IMPLANT
SET HNDPC FAN SPRY TIP SCT (DISPOSABLE) IMPLANT
SPONGE LAP 18X18 RF (DISPOSABLE) IMPLANT
STOCKINETTE IMPERVIOUS 9X36 MD (GAUZE/BANDAGES/DRESSINGS) ×2 IMPLANT
SUT ETHILON 3 0 FSL (SUTURE) ×5 IMPLANT
SUT ETHILON 3 0 PS 1 (SUTURE) IMPLANT
SUT PDS AB 2-0 CT1 27 (SUTURE) IMPLANT
SWAB CULTURE ESWAB REG 1ML (MISCELLANEOUS) IMPLANT
SYR CONTROL 10ML LL (SYRINGE) IMPLANT
TOWEL GREEN STERILE (TOWEL DISPOSABLE) ×2 IMPLANT
TOWEL GREEN STERILE FF (TOWEL DISPOSABLE) ×2 IMPLANT
TUBE CONNECTING 12X1/4 (SUCTIONS) ×2 IMPLANT
UNDERPAD 30X36 HEAVY ABSORB (UNDERPADS AND DIAPERS) ×2 IMPLANT
YANKAUER SUCT BULB TIP NO VENT (SUCTIONS) ×2 IMPLANT

## 2020-08-20 NOTE — Anesthesia Preprocedure Evaluation (Addendum)
Anesthesia Evaluation  Patient identified by MRN, date of birth, ID band Patient awake    Reviewed: Allergy & Precautions, H&P , NPO status , Patient's Chart, lab work & pertinent test results  Airway Mallampati: I  TM Distance: >3 FB Neck ROM: Full    Dental no notable dental hx. (+) Teeth Intact, Dental Advisory Given   Pulmonary neg pulmonary ROS,    Pulmonary exam normal breath sounds clear to auscultation       Cardiovascular hypertension,  Rhythm:Regular Rate:Normal     Neuro/Psych negative neurological ROS  negative psych ROS   GI/Hepatic negative GI ROS, Neg liver ROS,   Endo/Other  negative endocrine ROS  Renal/GU negative Renal ROS  negative genitourinary   Musculoskeletal   Abdominal   Peds  Hematology negative hematology ROS (+)   Anesthesia Other Findings   Reproductive/Obstetrics negative OB ROS                           Anesthesia Physical Anesthesia Plan  ASA: II and emergent  Anesthesia Plan: General   Post-op Pain Management:    Induction: Intravenous, Rapid sequence and Cricoid pressure planned  PONV Risk Score and Plan: 4 or greater and Ondansetron, Dexamethasone and Midazolam  Airway Management Planned: Oral ETT  Additional Equipment:   Intra-op Plan:   Post-operative Plan: Extubation in OR  Informed Consent: I have reviewed the patients History and Physical, chart, labs and discussed the procedure including the risks, benefits and alternatives for the proposed anesthesia with the patient or authorized representative who has indicated his/her understanding and acceptance.     Dental advisory given and Interpreter used for interveiw  Plan Discussed with: CRNA  Anesthesia Plan Comments:        Anesthesia Quick Evaluation
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
## 2020-08-20 NOTE — Anesthesia Procedure Notes (Signed)
Procedure Name: Intubation Date/Time: 08/20/2020 8:26 PM Performed by: Edmonia Caprio, CRNA Pre-anesthesia Checklist: Patient identified, Emergency Drugs available, Suction available, Patient being monitored and Timeout performed Patient Re-evaluated:Patient Re-evaluated prior to induction Oxygen Delivery Method: Circle system utilized Preoxygenation: Pre-oxygenation with 100% oxygen Induction Type: IV induction, Rapid sequence and Cricoid Pressure applied Laryngoscope Size: 2 and Miller Grade View: Grade I Tube type: Oral Tube size: 7.0 mm Number of attempts: 1 Airway Equipment and Method: Stylet Placement Confirmation: ETT inserted through vocal cords under direct vision,  positive ETCO2 and breath sounds checked- equal and bilateral Secured at: 22 cm Tube secured with: Tape Dental Injury: Teeth and Oropharynx as per pre-operative assessment

## 2020-08-20 NOTE — Anesthesia Postprocedure Evaluation (Signed)
Anesthesia Post Note  Patient: Pamela Alvarado  Procedure(s) Performed: IRRIGATION AND DEBRIDEMENT EXTREMITY and closure of leg wound (Right Leg Upper)     Patient location during evaluation: PACU Anesthesia Type: General Level of consciousness: awake and alert Pain management: pain level controlled Vital Signs Assessment: post-procedure vital signs reviewed and stable Respiratory status: spontaneous breathing, nonlabored ventilation, respiratory function stable and patient connected to nasal cannula oxygen Cardiovascular status: blood pressure returned to baseline and stable Postop Assessment: no apparent nausea or vomiting Anesthetic complications: no   No complications documented.  Last Vitals:  Vitals:   08/20/20 2155 08/20/20 2210  BP: (!) 101/49 (!) 103/52  Pulse: (!) 103 (!) 102  Resp: 20 (!) 23  Temp: 36.5 C   SpO2: 96% 96%    Last Pain:  Vitals:   08/20/20 2210  TempSrc:   PainSc: Asleep                 Aqueelah Cotrell,W. EDMOND

## 2020-08-20 NOTE — Transfer of Care (Signed)
Immediate Anesthesia Transfer of Care Note  Patient: Pamela Alvarado  Procedure(s) Performed: IRRIGATION AND DEBRIDEMENT EXTREMITY and closure of leg wound (Right Leg Upper)  Patient Location: PACU  Anesthesia Type:General  Level of Consciousness: drowsy and responds to stimulation  Airway & Oxygen Therapy: Patient Spontanous Breathing and Patient connected to nasal cannula oxygen  Post-op Assessment: Report given to RN and Post -op Vital signs reviewed and stable  Post vital signs: Reviewed and stable  Last Vitals:  Vitals Value Taken Time  BP 101/49 08/20/20 2154  Temp    Pulse 98 08/20/20 2155  Resp 26 08/20/20 2155  SpO2 95 % 08/20/20 2155  Vitals shown include unvalidated device data.  Last Pain:  Vitals:   08/20/20 1919  TempSrc:   PainSc: Asleep         Complications: No complications documented.

## 2020-08-20 NOTE — ED Notes (Signed)
Pt transported to CT with TRN 

## 2020-08-20 NOTE — Progress Notes (Signed)
TRN and primary RN both called CT for pt and was told they aren't able to hold the scanner for L2's and we're waiting on labs to result.
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
## 2020-08-20 NOTE — Consult Note (Signed)
Checked back on  pt after nurse said she'd be going to OR, but missed pt again. Prayed for her, and staff will call again if further need of chaplain services.  Rev. Donnel Saxon Chaplain
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
## 2020-08-21 ENCOUNTER — Encounter (HOSPITAL_BASED_OUTPATIENT_CLINIC_OR_DEPARTMENT_OTHER): Payer: Self-pay | Admitting: Orthopedic Surgery

## 2020-08-21 ENCOUNTER — Encounter (HOSPITAL_COMMUNITY): Payer: Self-pay | Admitting: Orthopedic Surgery

## 2020-08-21 LAB — URINALYSIS, ROUTINE W REFLEX MICROSCOPIC
Bacteria, UA: NONE SEEN
Bilirubin Urine: NEGATIVE
Glucose, UA: >=500 mg/dL — AB
Hgb urine dipstick: NEGATIVE
Ketones, ur: 80 mg/dL — AB
Leukocytes,Ua: NEGATIVE
Nitrite: NEGATIVE
Protein, ur: NEGATIVE mg/dL
Specific Gravity, Urine: 1.037 — ABNORMAL HIGH (ref 1.005–1.030)
pH: 5 (ref 5.0–8.0)

## 2020-08-21 LAB — CBC
HCT: 35.2 % — ABNORMAL LOW (ref 36.0–46.0)
HCT: 34.6 % — ABNORMAL LOW (ref 36.0–46.0)
Hemoglobin: 13.1 g/dL (ref 12.0–15.0)
Hemoglobin: 12.9 g/dL (ref 12.0–15.0)
MCH: 30.8 pg (ref 26.0–34.0)
MCH: 30.8 pg (ref 26.0–34.0)
MCHC: 37.2 g/dL — ABNORMAL HIGH (ref 30.0–36.0)
MCHC: 37.3 g/dL — ABNORMAL HIGH (ref 30.0–36.0)
MCV: 82.6 fL (ref 80.0–100.0)
MCV: 82.6 fL (ref 80.0–100.0)
Platelets: 202 10*3/uL (ref 150–400)
Platelets: 231 10*3/uL (ref 150–400)
RBC: 4.26 MIL/uL (ref 3.87–5.11)
RBC: 4.19 MIL/uL (ref 3.87–5.11)
RDW: 12.1 % (ref 11.5–15.5)
RDW: 12 % (ref 11.5–15.5)
WBC: 10.7 10*3/uL — ABNORMAL HIGH (ref 4.0–10.5)
WBC: 10.5 10*3/uL (ref 4.0–10.5)
nRBC: 0 % (ref 0.0–0.2)
nRBC: 0 % (ref 0.0–0.2)

## 2020-08-21 MED ORDER — METHOCARBAMOL 500 MG PO TABS: 500.0000 mg | ORAL_TABLET | Freq: Three times a day (TID) | ORAL | 0 refills | Status: DC | PRN

## 2020-08-21 MED ORDER — TRAMADOL HCL 50 MG PO TABS
50.0000 mg | ORAL_TABLET | Freq: Four times a day (QID) | ORAL | Status: DC
  Administered 2020-08-21 (×4): 50 mg via ORAL
  Filled 2020-08-21 (×4): qty 1

## 2020-08-21 MED ORDER — BISACODYL 10 MG RE SUPP: 10.0000 mg | Freq: Every day | RECTAL | Status: DC | PRN

## 2020-08-21 MED ORDER — METOCLOPRAMIDE HCL 5 MG PO TABS: 5.0000 mg | ORAL_TABLET | Freq: Three times a day (TID) | ORAL | Status: DC | PRN

## 2020-08-21 MED ORDER — DIPHENHYDRAMINE HCL 12.5 MG/5ML PO ELIX: 12.5000 mg | ORAL_SOLUTION | ORAL | Status: DC | PRN

## 2020-08-21 MED ORDER — OXYCODONE HCL 5 MG PO TABS
10.0000 mg | ORAL_TABLET | ORAL | Status: DC | PRN
  Administered 2020-08-21: 10 mg via ORAL
  Filled 2020-08-21: qty 2

## 2020-08-21 MED ORDER — OXYCODONE HCL 5 MG PO TABS: 5.0000 mg | ORAL_TABLET | ORAL | Status: DC | PRN

## 2020-08-21 MED ORDER — ACETAMINOPHEN 500 MG PO TABS: 1000.0000 mg | ORAL_TABLET | Freq: Four times a day (QID) | ORAL | 0 refills | Status: DC | PRN

## 2020-08-21 MED ORDER — ONDANSETRON HCL 4 MG PO TABS: 4.0000 mg | ORAL_TABLET | Freq: Two times a day (BID) | ORAL | 0 refills | Status: DC | PRN

## 2020-08-21 MED ORDER — ASPIRIN 81 MG PO CHEW: 81.0000 mg | CHEWABLE_TABLET | Freq: Two times a day (BID) | ORAL | 0 refills | Status: AC

## 2020-08-21 MED ORDER — ONDANSETRON HCL 4 MG PO TABS: 4.0000 mg | ORAL_TABLET | Freq: Four times a day (QID) | ORAL | Status: DC | PRN

## 2020-08-21 MED ORDER — CEFAZOLIN SODIUM-DEXTROSE 1-4 GM/50ML-% IV SOLN
1.0000 g | Freq: Four times a day (QID) | INTRAVENOUS | Status: AC
Start: 2020-08-21 — End: 2020-08-21
  Administered 2020-08-21 (×3): 1 g via INTRAVENOUS
  Filled 2020-08-21 (×4): qty 50

## 2020-08-21 MED ORDER — METHOCARBAMOL 1000 MG/10ML IJ SOLN: 500.0000 mg | Freq: Four times a day (QID) | INTRAVENOUS | Status: DC | PRN

## 2020-08-21 MED ORDER — POLYETHYLENE GLYCOL 3350 17 G PO PACK: 17.0000 g | PACK | Freq: Every day | ORAL | Status: DC | PRN

## 2020-08-21 MED ORDER — HYDROMORPHONE HCL 1 MG/ML IJ SOLN
0.5000 mg | INTRAMUSCULAR | Status: DC | PRN
  Administered 2020-08-21: 1 mg via INTRAVENOUS
  Filled 2020-08-21: qty 1

## 2020-08-21 MED ORDER — OXYCODONE HCL 5 MG PO TABS: 5.0000 mg | ORAL_TABLET | Freq: Four times a day (QID) | ORAL | 0 refills | Status: DC | PRN

## 2020-08-21 MED ORDER — ACETAMINOPHEN 500 MG PO TABS
1000.0000 mg | ORAL_TABLET | Freq: Four times a day (QID) | ORAL | Status: AC
  Administered 2020-08-21 (×4): 1000 mg via ORAL
  Filled 2020-08-21 (×4): qty 2

## 2020-08-21 MED ORDER — MAGNESIUM CITRATE PO SOLN: 1.0000 | Freq: Once | ORAL | Status: DC | PRN

## 2020-08-21 MED ORDER — METOCLOPRAMIDE HCL 5 MG/ML IJ SOLN: 5.0000 mg | Freq: Three times a day (TID) | INTRAMUSCULAR | Status: DC | PRN

## 2020-08-21 MED ORDER — DOCUSATE SODIUM 100 MG PO CAPS
100.0000 mg | ORAL_CAPSULE | Freq: Two times a day (BID) | ORAL | Status: DC
  Administered 2020-08-21 (×2): 100 mg via ORAL
  Filled 2020-08-21 (×2): qty 1

## 2020-08-21 MED ORDER — METHOCARBAMOL 500 MG PO TABS
500.0000 mg | ORAL_TABLET | Freq: Four times a day (QID) | ORAL | Status: DC | PRN
  Administered 2020-08-21: 500 mg via ORAL
  Filled 2020-08-21: qty 1

## 2020-08-21 MED ORDER — ACETAMINOPHEN 325 MG PO TABS: 325.0000 mg | ORAL_TABLET | Freq: Four times a day (QID) | ORAL | Status: DC | PRN

## 2020-08-21 MED ORDER — ONDANSETRON HCL 4 MG/2ML IJ SOLN: 4.0000 mg | Freq: Four times a day (QID) | INTRAMUSCULAR | Status: DC | PRN

## 2020-08-21 NOTE — Progress Notes (Signed)
Discharge package printed and instructions given to patient and husband with interpreter. Patient and spouse verbalize understanding.

## 2020-08-21 NOTE — Plan of Care (Signed)
  Problem: Pain Managment: Goal: General experience of comfort will improve Outcome: Progressing   Problem: Safety: Goal: Ability to remain free from injury will improve Outcome: Progressing   

## 2020-08-21 NOTE — Plan of Care (Signed)
  Problem: Education: Goal: Knowledge of General Education information will improve Description Including pain rating scale, medication(s)/side effects and non-pharmacologic comfort measures Outcome: Progressing   Problem: Nutrition: Goal: Adequate nutrition will be maintained Outcome: Progressing   Problem: Pain Managment: Goal: General experience of comfort will improve Outcome: Progressing   
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
## 2020-08-21 NOTE — Progress Notes (Addendum)
SPORTS MEDICINE AND JOINT REPLACEMENT  Georgena Spurling, MD    Laurier Nancy, PA-C [address] [place], [place], [place]  [address]                             [phone]   PROGRESS NOTE  Subjective:  negative for Chest Pain  negative for Shortness of Breath  negative for Nausea/Vomiting   negative for Calf Pain  negative for Bowel Movement   Tolerating Diet: yes         Patient reports pain as 5 on 0-10 scale.    Objective: Vital signs in last 24 hours:   Patient Vitals for the past 24 hrs:  BP Temp Temp src Pulse Resp SpO2 Height Weight  08/21/20 0800 106/67 97.9 F (36.6 C) Oral 92 17 98 % -- --  08/21/20 0428 108/69 98 F (36.7 C) Oral 88 16 98 % -- --  08/21/20 0200 109/64 98.1 F (36.7 C) -- -- 18 97 % -- --  08/21/20 0031 111/70 98.2 F (36.8 C) Oral 98 16 98 % -- --  08/20/20 2314 111/66 98.6 F (37 C) Oral 94 17 97 % -- --  08/20/20 2255 110/63 97.8 F (36.6 C) -- 94 18 99 % -- --  08/20/20 2240 -- 97.7 F (36.5 C) -- -- -- -- -- --  08/20/20 2225 110/70 97.7 F (36.5 C) -- (!) 102 20 98 % -- --  08/20/20 2210 (!) 103/52 -- -- (!) 102 (!) 23 96 % -- --  08/20/20 2155 (!) 101/49 97.7 F (36.5 C) -- (!) 103 20 96 % -- --  08/20/20 1930 121/84 -- -- (!) 114 18 100 % -- --  08/20/20 1915 124/78 -- -- (!) 105 (!) 21 99 % -- --  08/20/20 1830 130/84 -- -- (!) 111 20 99 % -- --  08/20/20 1745 122/79 -- -- (!) 105 13 100 % -- --  08/20/20 1719 122/80 97.7 F (36.5 C) Temporal -- -- -- -- --  08/20/20 1714 -- -- -- -- -- -- 4' 10.66" (1.49 m) 77.1 kg  08/20/20 1713 130/80 -- -- (!) 102 (!) 23 100 % -- --    @flow {1959:LAST@   Intake/Output from previous day:   02/18 0701 - 02/19 0700 In: 1660 [P.O.:360; I.V.:1250] Out: 250 [Urine:200]   Intake/Output this shift:   02/19 0701 - 02/19 1900 In: 120 [P.O.:120] Out: -    Intake/Output      02/18 0701 02/19 0700 02/19 0701 02/20 0700   P.O. 360 120   I.V. (mL/kg) 1250 (16.2)    IV Piggyback 50    Total  Intake(mL/kg) 1660 (21.5) 120 (1.6)   Urine (mL/kg/hr) 200    Blood 50    Total Output 250    Net +1410 +120        Urine Occurrence 3 x 1 x      LABORATORY DATA: Recent Labs    08/20/20 1729 08/20/20 1731 08/21/20 0216  WBC 18.2*  --  10.7*  HGB 15.9* 14.6 13.1  HCT 45.6 43.0 35.2*  PLT 328  --  202   Recent Labs    08/20/20 1729 08/20/20 1731  NA 135 136  K 3.1* 3.0*  CL 98 99  CO2 22  --   BUN 12 15  CREATININE 0.79 0.60  GLUCOSE 393* 397*  CALCIUM 9.2  --    Lab Results  Component Value Date  INR 1.0 08/20/2020    Examination:  General appearance: alert, cooperative and no distress Extremities: extremities normal, atraumatic, no cyanosis or edema  Wound Exam: clean, dry, intact   Drainage:  None: wound tissue dry  Motor Exam: Quadriceps and Hamstrings Intact  Sensory Exam: Superficial Peroneal, Deep Peroneal and Tibial normal   Assessment:    1 Day Post-Op  Procedure(s) (LRB): IRRIGATION AND DEBRIDEMENT EXTREMITY and closure of leg wound (Right)  ADDITIONAL DIAGNOSIS:  Active Problems:   Leg laceration     Plan: Physical Therapy as ordered Weight Bearing as Tolerated (WBAT)  DVT Prophylaxis:  Foot Pumps  DISCHARGE PLAN: Home  Patient resting comfortable at bedside with husband present. Will switch to solid foods/normal diet today. Pain improving. WBC count normalizing. No signs of compartment syndrome currently.  Will continue to follow.   Guy Sandifer 08/21/2020, 10:15 AM
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
## 2020-08-22 NOTE — Op Note (Signed)
08/20/2020  7:23 AM  PATIENT:  [name]    PRE-OPERATIVE DIAGNOSIS:  right leg laceration  POST-OPERATIVE DIAGNOSIS:  Same  PROCEDURE:  IRRIGATION AND DEBRIDEMENT EXTREMITY and closure of leg wound  SURGEON:  Sheral Apley, MD  ASSISTANT: Levester Fresh, PA-C, he was present and scrubbed throughout the case, critical for completion in a timely fashion, and for retraction, instrumentation, and closure.   ANESTHESIA:   gen  PREOPERATIVE INDICATIONS:  Pamela Alvarado is a  35 y.o. female with a diagnosis of right leg laceration who failed conservative measures and elected for surgical management.    The risks benefits and alternatives were discussed with the patient preoperatively including but not limited to the risks of infection, bleeding, nerve injury, cardiopulmonary complications, the need for revision surgery, among others, and the patient was willing to proceed.  OPERATIVE IMPLANTS: none  OPERATIVE FINDINGS: large laceration  BLOOD LOSS: min  COMPLICATIONS: none  TOURNIQUET TIME: none  OPERATIVE PROCEDURE:  Patient was identified in the preoperative holding area and site was marked by me She was transported to the operating theater and placed on the table in supine position taking care to pad all bony prominences. After a preincinduction time out anesthesia was induced. The right lower extremity was prepped and draped in normal sterile fashion and a pre-incision timeout was performed. She received ancef for preoperative antibiotics.   I performed a thorough irrigation of her leg wound she had roughly 60 cm circumferential laceration and then a separate 10 cm laceration.  I debrided any nonvitalized nonvital tissue.  This extensive debridement was excisional and went down to the level of muscle.  I then performed a complex closure of 160 cm laceration in 110 cm laceration.  Debridement type: Excisional Debridement  Side: right  Body  Location: leg   Tools used for debridement: scissors  Pre-debridement Wound size (cm):   Length: 60        Width: 20     Depth: 5   Post-debridement Wound size (cm):   Length: 0        Width: 0     Depth: 0   Debridement depth beyond dead/damaged tissue down to healthy viable tissue: yes  Tissue layer involved: skin, subcutaneous tissue, muscle / fascia, bone  Nature of tissue removed: Devitalized Tissue  Irrigation volume: 6L     Irrigation fluid type: Normal Saline       POST OPERATIVE PLAN: WBAT in brace. Mobilize and ASA for dvt px.

## 2020-08-28 ENCOUNTER — Emergency Department (HOSPITAL_COMMUNITY): Payer: Self-pay

## 2020-08-28 ENCOUNTER — Other Ambulatory Visit: Payer: Self-pay

## 2020-08-28 ENCOUNTER — Inpatient Hospital Stay (HOSPITAL_COMMUNITY)
Admission: EM | Admit: 2020-08-28 | Discharge: 2020-08-31 | DRG: 603 | Disposition: A | Discharge status: Home / Self Care | Payer: Self-pay | Attending: Internal Medicine | Admitting: Internal Medicine

## 2020-08-28 DIAGNOSIS — E119 Type 2 diabetes mellitus without complications: Secondary | ICD-10-CM

## 2020-08-28 DIAGNOSIS — W230XXD Caught, crushed, jammed, or pinched between moving objects, subsequent encounter: Secondary | ICD-10-CM | POA: Diagnosis present

## 2020-08-28 DIAGNOSIS — E1165 Type 2 diabetes mellitus with hyperglycemia: Secondary | ICD-10-CM | POA: Diagnosis present

## 2020-08-28 DIAGNOSIS — Z20822 Contact with and (suspected) exposure to covid-19: Secondary | ICD-10-CM | POA: Diagnosis present

## 2020-08-28 DIAGNOSIS — L03115 Cellulitis of right lower limb: Principal | ICD-10-CM | POA: Diagnosis present

## 2020-08-28 DIAGNOSIS — S8781XD Crushing injury of right lower leg, subsequent encounter: Secondary | ICD-10-CM | POA: Diagnosis not present

## 2020-08-28 DIAGNOSIS — E669 Obesity, unspecified: Secondary | ICD-10-CM | POA: Diagnosis present

## 2020-08-28 DIAGNOSIS — G8918 Other acute postprocedural pain: Secondary | ICD-10-CM

## 2020-08-28 DIAGNOSIS — O24112 Pre-existing diabetes mellitus, type 2, in pregnancy, second trimester: Secondary | ICD-10-CM

## 2020-08-28 DIAGNOSIS — Z6833 Body mass index (BMI) 33.0-33.9, adult: Secondary | ICD-10-CM

## 2020-08-28 DIAGNOSIS — L0390 Cellulitis, unspecified: Secondary | ICD-10-CM

## 2020-08-28 DIAGNOSIS — M9689 Other intraoperative and postprocedural complications and disorders of the musculoskeletal system: Secondary | ICD-10-CM

## 2020-08-28 DIAGNOSIS — O24113 Pre-existing diabetes mellitus, type 2, in pregnancy, third trimester: Secondary | ICD-10-CM

## 2020-08-28 DIAGNOSIS — E1169 Type 2 diabetes mellitus with other specified complication: Secondary | ICD-10-CM

## 2020-08-28 DIAGNOSIS — R739 Hyperglycemia, unspecified: Secondary | ICD-10-CM

## 2020-08-28 HISTORY — DX: Cellulitis, unspecified: L03.90

## 2020-08-28 LAB — BASIC METABOLIC PANEL
Anion gap: 11 (ref 5–15)
BUN: 13 mg/dL (ref 6–20)
CO2: 18 mmol/L — ABNORMAL LOW (ref 22–32)
Calcium: 8.7 mg/dL — ABNORMAL LOW (ref 8.9–10.3)
Chloride: 103 mmol/L (ref 98–111)
Creatinine, Ser: 0.56 mg/dL (ref 0.44–1.00)
GFR, Estimated: >60 mL/min (ref >60)
Glucose, Bld: 410 mg/dL — ABNORMAL HIGH (ref 70–99)
Potassium: 3.7 mmol/L (ref 3.5–5.1)
Sodium: 132 mmol/L — ABNORMAL LOW (ref 135–145)

## 2020-08-28 LAB — CBC
HCT: 35.1 % — ABNORMAL LOW (ref 36.0–46.0)
Hemoglobin: 12.5 g/dL (ref 12.0–15.0)
MCH: 30.3 pg (ref 26.0–34.0)
MCHC: 35.6 g/dL (ref 30.0–36.0)
MCV: 85.2 fL (ref 80.0–100.0)
Platelets: 307 10*3/uL (ref 150–400)
RBC: 4.12 MIL/uL (ref 3.87–5.11)
RDW: 12.9 % (ref 11.5–15.5)
WBC: 9.7 10*3/uL (ref 4.0–10.5)
nRBC: 0 % (ref 0.0–0.2)

## 2020-08-28 LAB — CBG MONITORING, ED: Glucose-Capillary: 343 mg/dL — ABNORMAL HIGH (ref 70–99)

## 2020-08-28 MED ORDER — INSULIN ASPART 100 UNIT/ML ~~LOC~~ SOLN: 10.0000 [IU] | Freq: Once | SUBCUTANEOUS | Status: DC

## 2020-08-28 MED ORDER — INSULIN ASPART 100 UNIT/ML ~~LOC~~ SOLN
0.0000 [IU] | Freq: Three times a day (TID) | SUBCUTANEOUS | Status: DC
  Administered 2020-08-29 (×2): 5 [IU] via SUBCUTANEOUS
  Administered 2020-08-29: 8 [IU] via SUBCUTANEOUS
  Administered 2020-08-30: 5 [IU] via SUBCUTANEOUS
  Administered 2020-08-30: 3 [IU] via SUBCUTANEOUS
  Administered 2020-08-30: 5 [IU] via SUBCUTANEOUS
  Administered 2020-08-31: 3 [IU] via SUBCUTANEOUS
  Administered 2020-08-31: 15 [IU] via SUBCUTANEOUS
  Administered 2020-08-31: 3 [IU] via SUBCUTANEOUS

## 2020-08-28 MED ORDER — INSULIN ASPART 100 UNIT/ML ~~LOC~~ SOLN
10.0000 [IU] | Freq: Once | SUBCUTANEOUS | Status: AC
  Administered 2020-08-28: 10 [IU] via SUBCUTANEOUS

## 2020-08-28 MED ORDER — SODIUM CHLORIDE 0.9 % IV BOLUS
1000.0000 mL | Freq: Once | INTRAVENOUS | Status: AC
  Administered 2020-08-28: 1000 mL via INTRAVENOUS

## 2020-08-28 MED ORDER — INSULIN ASPART 100 UNIT/ML ~~LOC~~ SOLN: 0.0000 [IU] | Freq: Three times a day (TID) | SUBCUTANEOUS | Status: DC

## 2020-08-28 MED ORDER — INSULIN ASPART 100 UNIT/ML ~~LOC~~ SOLN: 0.0000 [IU] | Freq: Every day | SUBCUTANEOUS | Status: DC

## 2020-08-28 MED ORDER — CEFTRIAXONE SODIUM 1 G IJ SOLR
1.0000 g | Freq: Once | INTRAMUSCULAR | Status: AC
  Administered 2020-08-28: 1 g via INTRAVENOUS
  Filled 2020-08-28: qty 10

## 2020-08-28 MED ORDER — OXYCODONE HCL 5 MG PO TABS
10.0000 mg | ORAL_TABLET | Freq: Once | ORAL | Status: AC
  Administered 2020-08-28: 10 mg via ORAL
  Filled 2020-08-28: qty 2

## 2020-08-28 MED ORDER — INSULIN ASPART 100 UNIT/ML ~~LOC~~ SOLN
0.0000 [IU] | Freq: Every day | SUBCUTANEOUS | Status: DC
  Administered 2020-08-29: 2 [IU] via SUBCUTANEOUS
  Administered 2020-08-29 – 2020-08-30 (×2): 3 [IU] via SUBCUTANEOUS
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
## 2020-08-28 NOTE — ED Triage Notes (Addendum)
Pt presents to ED POv. Pt c/o worsening pain and warmth in R leg since yesterday. Pt reports that she had recent surgery to R leg. Pt reports going to first post-op appointment on Monday, she was told everything was fine and her dressing was changed. Unable to palpate and/or doppler pulse in triage but pt has cap refill < 3 seconds
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
## 2020-08-29 ENCOUNTER — Encounter (HOSPITAL_COMMUNITY): Payer: Self-pay | Admitting: Family Medicine

## 2020-08-29 ENCOUNTER — Observation Stay (HOSPITAL_COMMUNITY): Payer: Self-pay

## 2020-08-29 DIAGNOSIS — L538 Other specified erythematous conditions: Secondary | ICD-10-CM

## 2020-08-29 DIAGNOSIS — M9689 Other intraoperative and postprocedural complications and disorders of the musculoskeletal system: Secondary | ICD-10-CM

## 2020-08-29 DIAGNOSIS — W230XXD Caught, crushed, jammed, or pinched between moving objects, subsequent encounter: Secondary | ICD-10-CM | POA: Diagnosis present

## 2020-08-29 DIAGNOSIS — O24113 Pre-existing diabetes mellitus, type 2, in pregnancy, third trimester: Secondary | ICD-10-CM

## 2020-08-29 DIAGNOSIS — E1165 Type 2 diabetes mellitus with hyperglycemia: Secondary | ICD-10-CM | POA: Diagnosis present

## 2020-08-29 DIAGNOSIS — E669 Obesity, unspecified: Secondary | ICD-10-CM | POA: Diagnosis present

## 2020-08-29 DIAGNOSIS — E1169 Type 2 diabetes mellitus with other specified complication: Secondary | ICD-10-CM

## 2020-08-29 DIAGNOSIS — E119 Type 2 diabetes mellitus without complications: Secondary | ICD-10-CM

## 2020-08-29 DIAGNOSIS — Z6833 Body mass index (BMI) 33.0-33.9, adult: Secondary | ICD-10-CM | POA: Diagnosis not present

## 2020-08-29 DIAGNOSIS — O24112 Pre-existing diabetes mellitus, type 2, in pregnancy, second trimester: Secondary | ICD-10-CM

## 2020-08-29 DIAGNOSIS — M7989 Other specified soft tissue disorders: Secondary | ICD-10-CM

## 2020-08-29 DIAGNOSIS — L03115 Cellulitis of right lower limb: Secondary | ICD-10-CM | POA: Diagnosis present

## 2020-08-29 DIAGNOSIS — Z20822 Contact with and (suspected) exposure to covid-19: Secondary | ICD-10-CM | POA: Diagnosis present

## 2020-08-29 DIAGNOSIS — S8781XD Crushing injury of right lower leg, subsequent encounter: Secondary | ICD-10-CM | POA: Diagnosis present

## 2020-08-29 DIAGNOSIS — M79604 Pain in right leg: Secondary | ICD-10-CM

## 2020-08-29 HISTORY — DX: Type 2 diabetes mellitus without complications: E11.9

## 2020-08-29 LAB — GLUCOSE, CAPILLARY
Glucose-Capillary: 176 mg/dL — ABNORMAL HIGH (ref 70–99)
Glucose-Capillary: 216 mg/dL — ABNORMAL HIGH (ref 70–99)
Glucose-Capillary: 253 mg/dL — ABNORMAL HIGH (ref 70–99)
Glucose-Capillary: 207 mg/dL — ABNORMAL HIGH (ref 70–99)
Glucose-Capillary: 272 mg/dL — ABNORMAL HIGH (ref 70–99)

## 2020-08-29 LAB — BASIC METABOLIC PANEL
Anion gap: 9 (ref 5–15)
BUN: 9 mg/dL (ref 6–20)
CO2: 20 mmol/L — ABNORMAL LOW (ref 22–32)
Calcium: 8.3 mg/dL — ABNORMAL LOW (ref 8.9–10.3)
Chloride: 109 mmol/L (ref 98–111)
Creatinine, Ser: 0.48 mg/dL (ref 0.44–1.00)
GFR, Estimated: >60 mL/min (ref >60)
Glucose, Bld: 197 mg/dL — ABNORMAL HIGH (ref 70–99)
Potassium: 3.5 mmol/L (ref 3.5–5.1)
Sodium: 138 mmol/L (ref 135–145)

## 2020-08-29 LAB — RESP PANEL BY RT-PCR (FLU A&B, COVID) ARPGX2
Influenza A by PCR: NEGATIVE
Influenza B by PCR: NEGATIVE
SARS Coronavirus 2 by RT PCR: NEGATIVE

## 2020-08-29 LAB — CBC
HCT: 34.4 % — ABNORMAL LOW (ref 36.0–46.0)
Hemoglobin: 12.1 g/dL (ref 12.0–15.0)
MCH: 30.5 pg (ref 26.0–34.0)
MCHC: 35.2 g/dL (ref 30.0–36.0)
MCV: 86.6 fL (ref 80.0–100.0)
Platelets: 189 10*3/uL (ref 150–400)
RBC: 3.97 MIL/uL (ref 3.87–5.11)
RDW: 13.2 % (ref 11.5–15.5)
WBC: 8 10*3/uL (ref 4.0–10.5)
nRBC: 0 % (ref 0.0–0.2)

## 2020-08-29 LAB — CBG MONITORING, ED
Glucose-Capillary: 213 mg/dL — ABNORMAL HIGH (ref 70–99)
Glucose-Capillary: 268 mg/dL — ABNORMAL HIGH (ref 70–99)

## 2020-08-29 LAB — LACTIC ACID, PLASMA: Lactic Acid, Venous: 1.7 mmol/L (ref 0.5–1.9)

## 2020-08-29 LAB — CK: Total CK: 311 U/L — ABNORMAL HIGH (ref 38–234)

## 2020-08-29 LAB — HIV ANTIBODY (ROUTINE TESTING W REFLEX): HIV Screen 4th Generation wRfx: NONREACTIVE

## 2020-08-29 LAB — HEMOGLOBIN A1C
Hgb A1c MFr Bld: 10.8 % — ABNORMAL HIGH (ref 4.8–5.6)
Mean Plasma Glucose: 263.26 mg/dL

## 2020-08-29 MED ORDER — SENNOSIDES-DOCUSATE SODIUM 8.6-50 MG PO TABS
1.0000 | ORAL_TABLET | Freq: Every evening | ORAL | Status: DC | PRN
  Administered 2020-08-30: 1 via ORAL
  Filled 2020-08-29 (×2): qty 1

## 2020-08-29 MED ORDER — LIVING WELL WITH DIABETES BOOK - IN SPANISH
Freq: Once | Status: AC
  Filled 2020-08-29: qty 1

## 2020-08-29 MED ORDER — DOCUSATE SODIUM 100 MG PO CAPS
100.0000 mg | ORAL_CAPSULE | Freq: Two times a day (BID) | ORAL | Status: DC
  Administered 2020-08-29 – 2020-08-31 (×5): 100 mg via ORAL
  Filled 2020-08-29 (×6): qty 1

## 2020-08-29 MED ORDER — OXYCODONE-ACETAMINOPHEN 5-325 MG PO TABS
1.0000 | ORAL_TABLET | Freq: Four times a day (QID) | ORAL | Status: DC | PRN
  Administered 2020-08-29 – 2020-08-31 (×7): 1 via ORAL
  Filled 2020-08-29 (×7): qty 1

## 2020-08-29 MED ORDER — INSULIN GLARGINE 100 UNIT/ML ~~LOC~~ SOLN
10.0000 [IU] | Freq: Every day | SUBCUTANEOUS | Status: DC
  Administered 2020-08-29: 10 [IU] via SUBCUTANEOUS
  Filled 2020-08-29 (×2): qty 0.1

## 2020-08-29 MED ORDER — SODIUM CHLORIDE 0.9 % IV SOLN
1.0000 g | INTRAVENOUS | Status: DC
  Administered 2020-08-29: 1 g via INTRAVENOUS
  Filled 2020-08-29: qty 10
  Filled 2020-08-29: qty 1

## 2020-08-29 MED ORDER — IOHEXOL 300 MG/ML  SOLN
100.0000 mL | Freq: Once | INTRAMUSCULAR | Status: AC | PRN
  Administered 2020-08-29: 100 mL via INTRAVENOUS
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
## 2020-08-29 NOTE — Progress Notes (Signed)
VASCULAR LAB    Right lower extremity venous duplex has been performed.  See CV proc for preliminary results.   Dynisha Due, RVT 08/29/2020, 9:02 AM

## 2020-08-30 ENCOUNTER — Telehealth: Payer: Self-pay

## 2020-08-30 LAB — GLUCOSE, CAPILLARY
Glucose-Capillary: 165 mg/dL — ABNORMAL HIGH (ref 70–99)
Glucose-Capillary: 205 mg/dL — ABNORMAL HIGH (ref 70–99)
Glucose-Capillary: 246 mg/dL — ABNORMAL HIGH (ref 70–99)
Glucose-Capillary: 297 mg/dL — ABNORMAL HIGH (ref 70–99)
Glucose-Capillary: 351 mg/dL — ABNORMAL HIGH (ref 70–99)

## 2020-08-30 MED ORDER — LIVING WELL WITH DIABETES BOOK - IN SPANISH
Freq: Once | Status: AC
  Filled 2020-08-30 (×2): qty 1

## 2020-08-30 MED ORDER — IBUPROFEN 400 MG PO TABS
600.0000 mg | ORAL_TABLET | Freq: Four times a day (QID) | ORAL | Status: DC
  Administered 2020-08-30 – 2020-08-31 (×4): 600 mg via ORAL
  Filled 2020-08-30 (×4): qty 1

## 2020-08-30 MED ORDER — INSULIN GLARGINE 100 UNIT/ML ~~LOC~~ SOLN
15.0000 [IU] | Freq: Every day | SUBCUTANEOUS | Status: DC
  Administered 2020-08-31: 15 [IU] via SUBCUTANEOUS
  Filled 2020-08-30: qty 0.15

## 2020-08-30 NOTE — TOC Initial Note (Signed)
Transition of Care [facility]) - Initial/Assessment Note    Patient Details  Name: Pamela Alvarado MRN: [id number] Date of Birth: August 07, 1985  [facility]) CM/SW Contact:    Kingsley Plan, RN Phone Number: 08/30/2020, 10:56 AM  Clinical Narrative:                  Scheduled follow up appointment at [facility] for September 23, 2020 at 2:15 pm they will also assistance with orange card application( medication)   Changed pharmacy to [facility]. NCM will assist with prescriptions at discharge. Expected Discharge Plan: Home/Self Care Barriers to Discharge: Continued Medical Work up   Patient Goals and CMS Choice Patient states their goals for this hospitalization and ongoing recovery are:: to return to home CMS Medicare.gov Compare Post Acute Care list provided to:: Patient    Expected Discharge Plan and Services Expected Discharge Plan: Home/Self Care In-house Referral: Financial Counselor Discharge Planning Services: CM Consult,Indigent Health [facility] Program,Medication Assistance,Follow-up appt scheduled   Living arrangements for the past 2 months: Single Family Home                 DME Arranged: N/A         HH Arranged: NA          Prior Living Arrangements/Services Living arrangements for the past 2 months: Single Family Home Lives with:: Spouse Patient language and need for interpreter reviewed:: Yes        Need for Family Participation in Patient Care: Yes (Comment) Care giver support system in place?: Yes (comment)   Criminal Activity/Legal Involvement Pertinent to Current Situation/Hospitalization: No - Comment as needed  Activities of Daily Living Home Assistive Devices/Equipment: None ADL Screening (condition at time of admission) Patient's cognitive ability adequate to safely complete daily activities?: Yes Is the patient deaf or have difficulty hearing?: No Does the patient have difficulty seeing,  even when wearing glasses/contacts?: No Does the patient have difficulty concentrating, remembering, or making decisions?: No Patient able to express need for assistance with ADLs?: Yes Does the patient have difficulty dressing or bathing?: Yes Independently performs ADLs?: No Communication: Independent Dressing (OT): Dependent Is this a change from baseline?: Change from baseline, expected to last >3 days Grooming: Independent Feeding: Independent Bathing: Dependent Is this a change from baseline?: Change from baseline, expected to last >3 days Toileting: Needs assistance Is this a change from baseline?: Change from baseline, expected to last >3days In/Out Bed: Needs assistance Is this a change from baseline?: Change from baseline, expected to last >3 days Walks in Home: Needs assistance Does the patient have difficulty walking or climbing stairs?: Yes Weakness of Legs: Both Weakness of Arms/Hands: None  Permission Sought/Granted   Permission granted to share information with : No              Emotional Assessment       Orientation: : Oriented to Self,Oriented to Place,Oriented to  Time,Oriented to Situation Alcohol / Substance Use: Not Applicable Psych Involvement: No (comment)  Admission diagnosis:  Cellulitis [L03.90] Post-op pain [G89.18] Postoperative surgical complication involving musculoskeletal system associated with musculoskeletal procedure, unspecified complication [M96.89] Diabetes (HCC) [E11.9] Patient Active Problem List   Diagnosis Date Noted   DM (diabetes mellitus) (HCC) 08/29/2020   Diabetes (HCC) 08/29/2020   Cellulitis 08/28/2020   PCP:  Pcp, No Pharmacy:   [id number] - [place], [place] - [address] AT [facility] [address]  [address] [place] [address] [place] [address] Phone: [phone] Fax: [phone]  [facility] - [place], [place] - [address] [place] [address] Phone: [phone] Fax: [phone]     Social Determinants of Health (SDOH) Interventions    Readmission Risk Interventions No flowsheet data found.

## 2020-08-30 NOTE — Progress Notes (Signed)
[facility] chat to verify the insulin and CBG orders. Ilean Skill LPN
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
## 2020-08-30 NOTE — Progress Notes (Addendum)
Inpatient Diabetes Program Recommendations  AACE/ADA: New Consensus Statement on Inpatient Glycemic Control (2015)  Target Ranges:  Prepandial:   less than 140 mg/dL      Peak postprandial:   less than 180 mg/dL (1-2 hours)      Critically ill patients:  140 - 180 mg/dL   Lab Results  Component Value Date   GLUCAP 205 (H) 08/30/2020   HGBA1C 10.8 (H) 08/29/2020    Review of Glycemic Control Results for Pamela Alvarado, Pamela Alvarado (MRN [id number]) as of 08/30/2020 10:51  Ref. Range 08/29/2020 20:29 08/30/2020 00:43 08/30/2020 06:01  Glucose-Capillary Latest Ref Range: 70 - 99 mg/dL 563 (H) 875 (H) 643 (H)  Results for Pamela Alvarado, Pamela Alvarado (MRN [id number]) as of 08/30/2020 10:51  Ref. Range 08/29/2020 03:26  Hemoglobin A1C Latest Ref Range: 4.8 - 5.6 % 10.8 (H)   Diabetes history: DM 2- New diagnosis Outpatient Diabetes medications:  None Current orders for Inpatient glycemic control:  Novolog moderate tid with meals and HS Lantus 15 units daily   Inpatient Diabetes Program Recommendations:    Note A1C of 10.8% indicating new diagnosis of DM (average CBG=263 mg/dL). Diabetes coordinator will see patient to discuss new diagnosis. Unsure if patient will need insulin or oral agents at d/c.   Thanks,  Beryl Meager, RN, BC-ADM Inpatient Diabetes Coordinator Pager [phone] (8a-5p)
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
## 2020-08-30 NOTE — Progress Notes (Addendum)
Inpatient Diabetes Program Recommendations  AACE/ADA: New Consensus Statement on Inpatient Glycemic Control (2015)  Target Ranges:  Prepandial:   less than 140 mg/dL      Peak postprandial:   less than 180 mg/dL (1-2 hours)      Critically ill patients:  140 - 180 mg/dL   Lab Results  Component Value Date   GLUCAP 246 (H) 08/30/2020   HGBA1C 10.8 (H) 08/29/2020    Review of Glycemic Control Results for Pamela Alvarado, Pamela Alvarado (MRN [id number]) as of 08/30/2020 14:34  Ref. Range 08/29/2020 16:38 08/29/2020 20:29 08/30/2020 00:43 08/30/2020 06:01 08/30/2020 11:04  Glucose-Capillary Latest Ref Range: 70 - 99 mg/dL 207 (H) 272 (H) 351 (H) 205 (H) 246 (H)   Diabetes history: New DM2 Current orders for Inpatient glycemic control: Novolog 0-15 units TID & 0-5 units QHS, Lantus 15 units daily  Spoke with patient at bedside using [place], [place] [phone] about new diagnosis. Discussed A1C results with her and explained what an A1C is, basic pathophysiology of DM Type 2, basic home care, basic diabetes diet nutrition principles, importance of checking CBGs and maintaining good CBG control to prevent long-term and short-term complications. Reviewed signs and symptoms of hyperglycemia and hypoglycemia and how to treat hypoglycemia at home. Also reviewed blood sugar goals at home.  RNs to provide ongoing basic DM education at bedside with this patient. Have ordered educational booklet, insulin starter kit, and DM videos. Have also placed RD consult for DM diet education for this patient.  She is uninsured and will likely do best with 70/30 BID.  Will continue to follow glucose trends and insulin needs for dosing recommendations at discharge.  TOC is following and patient has a follow up appointment with [facility] on 3/24.  We were disconnected from interpreter.  Called Pamela Alvarado and she will assist with interpreting this afternoon.  Will addend note at that time.    Addendum @ [place] with this DM  coordinator at bedside.  Reviewed all above contents that were discussed with [employer] Interpreter.   Educated patient on insulin pen use at home. Reviewed contents of insulin flexpen starter kit. Reviewed all steps of insulin pen including attachment of needle, 2-unit air shot, dialing up dose, giving injection, removing needle, disposal of sharps, storage of unused insulin, disposal of insulin etc. Patient able to provide successful return demonstration. Also reviewed troubleshooting with insulin pen. MD to give patient Rxs for insulin pens and insulin pen needles.  Discussed contents of 70/30 insulin and when to take (breakfast and supper).  Explained this may be short term and may be able to transition to oral DM medications.  Provided her with a Relion glucometer.  Set date and time and demonstrated how to use.  She should check her fasting, 2 hours after lunch and before bedtime.  If <100 mg/dL at bedtime have a small snack with CHO's.  If CBG's are consistently < 100 she should call [place].    Will see patient again tomorrow and reinforce education.  Please allow patient to check her own blood sugar with hospital meter and self administer insulin with assistance of RN.      Will continue to follow while inpatient.  Thank you, Reche Dixon, RN, BSN Diabetes Coordinator Inpatient Diabetes Program [phone] (team pager from 8a-5p)

## 2020-08-30 NOTE — Telephone Encounter (Signed)
Copied from CRM [id number]. Topic: General - Other >> Aug 30, 2020 10:48 AM Leafy Ro wrote: Reason for CRM:Pt would like to apply for orange card. Pt has an appt with angela on 09-23-2020  Call placed to patient with interpreter service advising patient to call [phone] about financial.   If patient returns phone call please advise patient that she must first complete her HFU and at that appointment we will provide her with a financial application and schedule her for a visit.

## 2020-08-31 ENCOUNTER — Other Ambulatory Visit: Payer: Self-pay | Admitting: Orthopedic Surgery

## 2020-08-31 LAB — GLUCOSE, CAPILLARY
Glucose-Capillary: 195 mg/dL — ABNORMAL HIGH (ref 70–99)
Glucose-Capillary: 351 mg/dL — ABNORMAL HIGH (ref 70–99)
Glucose-Capillary: 156 mg/dL — ABNORMAL HIGH (ref 70–99)

## 2020-08-31 MED ORDER — PROSOURCE PLUS PO LIQD: 30.0000 mL | Freq: Two times a day (BID) | ORAL | Status: DC

## 2020-08-31 MED ORDER — IBUPROFEN 600 MG PO TABS: 600.0000 mg | ORAL_TABLET | Freq: Four times a day (QID) | ORAL | 0 refills | Status: DC

## 2020-08-31 MED ORDER — INSULIN ASPART PROT & ASPART (70-30 MIX) 100 UNIT/ML PEN: 10.0000 [IU] | PEN_INJECTOR | Freq: Two times a day (BID) | SUBCUTANEOUS | 0 refills | Status: DC

## 2020-08-31 MED ORDER — SENNOSIDES-DOCUSATE SODIUM 8.6-50 MG PO TABS: 1.0000 | ORAL_TABLET | Freq: Every evening | ORAL | Status: DC | PRN

## 2020-08-31 MED ORDER — GABAPENTIN 600 MG PO TABS
300.0000 mg | ORAL_TABLET | Freq: Once | ORAL | Status: AC
  Administered 2020-08-31: 300 mg via ORAL
  Filled 2020-08-31: qty 1

## 2020-08-31 MED ORDER — GABAPENTIN 100 MG PO CAPS: 100.0000 mg | ORAL_CAPSULE | Freq: Three times a day (TID) | ORAL | 0 refills | Status: DC | PRN

## 2020-08-31 NOTE — TOC Progression Note (Addendum)
Transition of Care [facility] - Progression Note    Patient Details  Name: Pamela Alvarado MRN: [id number] Date of Birth: [birth year]  [facility] CM/SW Contact  Donnesha Karg, Adria Devon, RN Phone Number: 08/31/2020, 8:36 AM  Clinical Narrative:     See prior note   Follow up appointment at [facility], September 23, 2020 at 2:30 pm on AVS  Follow up appointment Dr Karie Schwalbe. Eulah Pont September 06, 2020 at 1000 on AVS  Will assist with medications at discharge through [facility] and [facility]    3 in 1 ordered with Velna Hatchet with [employer]  Expected Discharge Plan: Home/Self Care Barriers to Discharge: Continued Medical Work up  Expected Discharge Plan and Services Expected Discharge Plan: Home/Self Care In-house Referral: Financial Counselor Discharge Planning Services: CM Consult,Indigent Health [facility] Program,Medication Assistance,Follow-up appt scheduled   Living arrangements for the past 2 months: Single Family Home                 DME Arranged: N/A         HH Arranged: NA           Social Determinants of Health (SDOH) Interventions    Readmission Risk Interventions No flowsheet data found.

## 2020-08-31 NOTE — Progress Notes (Signed)
Patient discharged home via wheelchair with husband.  
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
## 2020-08-31 NOTE — Progress Notes (Signed)
Discharge teaching complete. Meds, diet, activity, follow up appointments and incision care reviewed and all questions answered. Copy of instructions given to patient and meds brought to bedside by TOC. Mariel, interpreter present during all of discharge teaching.

## 2020-08-31 NOTE — Progress Notes (Signed)
Inpatient Diabetes Program Recommendations  AACE/ADA: New Consensus Statement on Inpatient Glycemic Control (2015)  Target Ranges:  Prepandial:   less than 140 mg/dL      Peak postprandial:   less than 180 mg/dL (1-2 hours)      Critically ill patients:  140 - 180 mg/dL   Lab Results  Component Value Date   GLUCAP 351 (H) 08/31/2020   HGBA1C 10.8 (H) 08/29/2020    Review of Glycemic Control Results for SHALEKA, BRINES (MRN [id number]) as of 08/31/2020 13:28  Ref. Range 08/31/2020 06:03 08/31/2020 11:23  Glucose-Capillary Latest Ref Range: 70 - 99 mg/dL 338 (H) 250 (H)   Diabetes history: DM2 (newly diagnosed)  Current orders for Inpatient glycemic control: Lantus 15 units daily, Novolog 0-15 units TID & 0-5 units QHS  Inpatient Diabetes Program Recommendations:     Late entry from 11:00 today: Spoke with patient again today with Lum Keas, interpreter.  Reinforced education from what was taught yesterday on diabetes, insulin pen administration, CHO's and hypoglycemia.  Answered all questions.  Patient states she feels much better after our conversation yesterday and has been self administering insulin here with her RN.  All questions answered.  Patient states she feels confident discharging with insulin and verbalized treatment for hypoglycemia.  She is aware to bring her meter with her for PCP visit.    Dr. Benjamine Mola discharging today.  Would recommend discharging on 70/30 10 units BID (with breakfast and supper) and close follow up with PCP.  Patient has glucometer at bedside.    Novolog 70/30 Flexpen order # [id number] Pen needles order # [id number]   Will continue to follow while inpatient.  Thank you, Dulce Sellar, RN, BSN Diabetes Coordinator Inpatient Diabetes Program [phone] (team pager from 8a-5p)

## 2020-08-31 NOTE — Evaluation (Signed)
Physical Therapy Evaluation and Discharge Patient Details Name: Pamela Alvarado MRN: [id number] DOB: 01-12-1986 Today's Date: 08/31/2020   History of Present Illness  35 y.o. female with no significant past medical history who presents recent (2/18) crush injury of the right leg who presented 08/28/20  with worsening erythema and pain to the right leg. Dopplers bil LEs negative for DVT; xrays remained negative for fractures; +cellulitis; new diagnosis of DM2  Clinical Impression   Pt admitted with above diagnosis. Prior to accident pt was independent with all mobility.  Pt currently requires min-guard assist for ambulation with RW and for stairs. She was fully educated on edema management (including positioning, exercises and walking).  Patient to be discharged home with no follow-up PT needed.     Follow Up Recommendations No PT follow up;Supervision - Intermittent    Equipment Recommendations  3in1 (PT)    Recommendations for Other Services OT consult     Precautions / Restrictions Precautions Precautions: Fall Restrictions Weight Bearing Restrictions: No Other Position/Activity Restrictions: educated extensively on edema management including elevation, toe wiggling, ankle pumps, walking      Mobility  Bed Mobility Overal bed mobility: Modified Independent                  Transfers Overall transfer level: Needs assistance Equipment used: Rolling walker (2 wheeled) Transfers: Sit to/from Stand Sit to Stand: Min guard         General transfer comment: vc for proper use of RW (however difficult/delayed due to interpreter)  Ambulation/Gait Ambulation/Gait assistance: Min guard Gait Distance (Feet): 35 Feet Assistive device: Rolling walker (2 wheeled) Gait Pattern/deviations: Step-to pattern;Decreased stride length;Antalgic     General Gait Details: pt with increased wt-bearing thru bil LEs due to pain; initially only putting forefoot on the floor and with  cues able to get foot flat. Educated on step-to to decr strain on rt calf by not stepping past RLE with LLE. Educated to walk at home at least 3x/day.  Stairs Stairs: Yes       General stair comments: demonstrated up/down single step either forwards or backwards (pt reports it is a tall step and backwards would seem to work better; she has been going forwards up)  Engineer, drilling [place] (Stroke Patients Only)       Balance                                             Pertinent Vitals/Pain Pain Assessment: 0-10 Pain Score: 3  Pain Location: along incision Pain Descriptors / Indicators: Discomfort;Pressure Pain Intervention(s): Limited activity within patient's tolerance;Monitored during session;Repositioned    Home Living Family/patient expects to be discharged to:: Private residence Living Arrangements: Spouse/significant other Available Help at Discharge: Family   Home Access: Stairs to enter Entrance Stairs-Rails: None Entrance Stairs-Number of Steps: 1 Home Layout: One level Home Equipment: Environmental consultant - 2 wheels;Shower seat      Prior Function Level of Independence: Independent               Hand Dominance        Extremity/Trunk Assessment   Upper Extremity Assessment Upper Extremity Assessment: Overall WFL for tasks assessed    Lower Extremity Assessment Lower Extremity Assessment: RLE deficits/detail RLE Deficits / Details: edematous, large sutured incision around rt knee; multiple intact blisters RLE: Unable to fully assess due to  pain    Cervical / Trunk Assessment Cervical / Trunk Assessment: Normal  Communication   Communication: Prefers language other than [place];Interpreter utilized Teacher, adult education via [employer] interpreters)  Cognition Arousal/Alertness: Awake/alert Behavior During Therapy: WFL for tasks assessed/performed Overall Cognitive Status: Within Functional Limits for tasks assessed                                         General Comments      Exercises Other Exercises Other Exercises: toe wiggling, ankle pumps x 10, knee extension for full ROM x 10 reps RLE   Assessment/Plan    PT Assessment Patent does not need any further PT services  PT Problem List         PT Treatment Interventions      PT Goals (Current goals can be found in the Care Plan section)  Acute Rehab PT Goals Patient Stated Goal: be able to take care of herself PT Goal Formulation: All assessment and education complete, DC therapy    Frequency     Barriers to discharge        Co-evaluation               AM-PAC PT "6 Clicks" Mobility  Outcome Measure Help needed turning from your back to your side while in a flat bed without using bedrails?: None Help needed moving from lying on your back to sitting on the side of a flat bed without using bedrails?: None Help needed moving to and from a bed to a chair (including a wheelchair)?: A Little Help needed standing up from a chair using your arms (e.g., wheelchair or bedside chair)?: A Little Help needed to walk in hospital room?: A Little Help needed climbing 3-5 steps with a railing? : A Little 6 Click Score: 20    End of Session   Activity Tolerance: Patient limited by pain Patient left: in bed;with call bell/phone within reach   PT Visit Diagnosis: Difficulty in walking, not elsewhere classified (R26.2)    Time: 4132-4401 PT Time Calculation (min) (ACUTE ONLY): 38 min   Charges:   PT Evaluation $PT Eval Low Complexity: 1 Low PT Treatments $Gait Training: 8-22 mins $Self Care/Home Management: 8-22         Jerolyn Center, PT Pager [phone]   Zena Amos 08/31/2020, 10:55 AM

## 2020-08-31 NOTE — Progress Notes (Signed)
Initial Nutrition Assessment  DOCUMENTATION CODES:   Obesity unspecified  INTERVENTION:  Provide 30 ml Prosource Plus po BID, each supplement provides 100 kcal and 15 grams of protein.   Encourage adequate PO intake.   Diet handouts regarding diabetes given.  NUTRITION DIAGNOSIS:   Increased nutrient needs related to wound healing as evidenced by estimated needs.  GOAL:   Patient will meet greater than or equal to 90% of their needs  MONITOR:   Supplement acceptance,PO intake,Labs,I & O's,Skin,Weight trends  REASON FOR ASSESSMENT:   Consult Diet education  ASSESSMENT:   35 y.o. female with no significant past medical history who presents recent crush injury of the right leg who presents with worsening erythema and pain to the right leg.  Meal completion has been 100%. Pt reports having a good appetite currently and PTA with no difficulties. RD to order nutritional supplements to aid in wound healing. RD consulted for diet education regarding new diagnosis diabetes. Handouts "Counting Carbohydrates for People with Diabetes" in both Spanish and [place] language from the Academy of Nutrition and Dietetics Manual was given.  Provided list of carbohydrates and recommended serving sizes of common foods. Discussed importance of controlled and consistent carbohydrate intake. Labs and medications reviewed.   NUTRITION - FOCUSED PHYSICAL EXAM:  Flowsheet Row Most Recent Value  Orbital Region No depletion  Upper Arm Region No depletion  Thoracic and Lumbar Region No depletion  Buccal Region No depletion  Temple Region No depletion  Clavicle Bone Region No depletion  Clavicle and Acromion Bone Region No depletion  Scapular Bone Region Unable to assess  Dorsal Hand No depletion  Patellar Region No depletion  Anterior Thigh Region No depletion  Posterior Calf Region No depletion  Edema (RD Assessment) Mild  Hair Reviewed  Eyes Reviewed  Mouth Reviewed  Skin Reviewed  Nails  Reviewed     Diet Order:   Diet Order            Diet Carb Modified           Diet Carb Modified Fluid consistency: Thin; Room service appropriate? Yes  Diet effective now                 EDUCATION NEEDS:   Education needs have been addressed  Skin:  Skin Assessment: Skin Integrity Issues: Skin Integrity Issues:: Incisions Incisions: R pretibial  Last BM:  2/25  Height:   Ht Readings from Last 1 Encounters:  08/29/20 4\' 9"  (1.448 m)    Weight:   Wt Readings from Last 1 Encounters:  08/29/20 70.1 kg   BMI:  Body mass index is 33.44 kg/m.  Estimated Nutritional Needs:   Kcal:  1850-2050  Protein:  90-100 grams  Fluid:  >/= 1.8 L/day  08/31/20, MS, RD, LDN RD pager number/after hours weekend pager number on Am[employer].

## 2020-08-31 NOTE — Plan of Care (Signed)

## 2020-08-31 NOTE — Progress Notes (Signed)
Subjective: Patient reports pain as mild. She says this morning her right big toe was numb which worried her so she asked me to come see her. Since that time, she was given pillows under her heel and educated about elevation to decrease swelling. By the time I came by to see her, the numbness had subsided. She does endorse new pain in her upper lateral right thigh as well as a burning sensation down her leg. Tolerating diet.  Urinating.  No CP, SOB, fever, chills, N/V.  Has been OOB with PT/OT using walker. Still reports some difficulty fully planting her right foot on the floor without tightness behind her knee.   Objective:   VITALS:   Vitals:   08/30/20 1717 08/30/20 2304 08/31/20 0453 08/31/20 1209  BP: (!) 93/59 (!) 94/57 (!) 95/59 100/64  Pulse: 78 68 66 72  Resp: 18 16 18 18   Temp: 98.4 F (36.9 C) 97.7 F (36.5 C) 98.4 F (36.9 C) 97.9 F (36.6 C)  TempSrc: Oral Oral Oral Oral  SpO2: 100% 98% 97% 99%  Weight:      Height:       CBC Latest Ref Rng & Units 08/29/2020 08/28/2020  WBC 4.0 - 10.5 K/uL 8.0 9.7  Hemoglobin 12.0 - 15.0 g/dL 08/30/2020 65.4  Hematocrit 65.0 - 46.0 % 34.4(L) 35.1(L)  Platelets 150 - 400 K/uL 189 307   BMP Latest Ref Rng & Units 08/29/2020 08/28/2020  Glucose 70 - 99 mg/dL 08/30/2020) 656(C)  BUN 6 - 20 mg/dL 9 13  Creatinine 127(N - 1.00 mg/dL 1.70 0.17  Sodium 4.94 - 145 mmol/L 138 132(L)  Potassium 3.5 - 5.1 mmol/L 3.5 3.7  Chloride 98 - 111 mmol/L 109 103  CO2 22 - 32 mmol/L 20(L) 18(L)  Calcium 8.9 - 10.3 mg/dL 8.3(L) 8.7(L)   Intake/Output      02/28 0701 03/01 0700 03/01 0701 03/02 0700   P.O. 240 600   Total Intake(mL/kg) 240 (3.4) 600 (8.6)   Net +240 +600        Urine Occurrence 1 x 3 x      Physical Exam: General: NAD.  Laying comfortably in bed.  Resp: No increased wob Cardio: regular rate and rhythm ABD soft Neurologically intact MSK Neurovascularly intact Sensation intact distally Intact pulses distally at both PT and  DP Dorsiflexion/Plantar flexion intact Incision: C/D/I, nylon sutures throughout, no signs of bleeding or discharge Minimal erythema Edema is non-pitting and to be expected in postoperative setting Compartments soft and only mildly tender to calf squeeze Several hemorrhagic bullae seen around the laceration site Several areas of ecchymosis from the upper lateral thigh moving distally towards the calf   Assessment: S/P I&D with complex closure of laceration by Dr. 05/02. Jewel Baize on 08/20/20  Active Problems:   DM (diabetes mellitus) (HCC)   Diabetes (HCC)  Continued pain and edema following the above mentioned procedure  Plan: Discussed in detail about pain, edema, bruising, blisters as sequela of her crush injury on 08/20/20. It is not unlikely that she will continue to have things like the above pop up in the next week or so as she had a traumatic crush injury to the right leg.  Encouraged continued elevation of RLE to help decrease swelling which will decrease pain.  Added Gabapentin to her medicines due to the burning nerve type pain she described going down her right leg.  I checked all the compartments in her entire right leg and all are compressible.  Palpated strong PT and DP pulses. Capillary refill brisk. Sensation intact. Not concerned for compartment syndrome.  No discharge from wound. No constitutional symptoms. Not concerned about infection being present.   Up with therapy Incentive Spirometry Elevate and Apply ice  Weightbearing: WBAT RLE, use assistive device like walker to help Insicional and dressing care: Reinforce dressings as needed. Do not try to pop the blood blisters. They will drain on their own. If they pop, clean the area with soap and water and apply bandages as needed.  Orthopedic device(s): None Showering: ok to let water run over incision and pat dry. Do not submerge area in water such as a bathtub, swimming pool, hot tub.  VTE prophylaxis: Aspirin  81mg  BID for 30 days postop to prevent blood clots from forming, SCDs, ambulation Pain control: Ibuprofen, Gabapentin, Oxycodone Follow - up plan: appointment with Dr. on 09/06/20 to have sutures removed Contact information:  11/06/20 MD, Margarita Rana PA-C  Dispo: Home likely this evening   Levester Fresh, Jenne Pane Office [place] 08/31/2020, 3:53 PM

## 2020-08-31 NOTE — Evaluation (Signed)
Occupational Therapy Evaluation Patient Details Name: Nancylee Gaines MRN: [id number] DOB: 19-Jun-1986 Today's Date: 08/31/2020    History of Present Illness 35 y.o. female with no significant past medical history who presents recent (2/18) crush injury of the right leg who presented 08/28/20  with worsening erythema and pain to the right leg. Dopplers bil LEs negative for DVT; xrays remained negative for fractures; +cellulitis; new diagnosis of DM2   Clinical Impression   Pt PTA: Pt living at home with spouse; supervisionA to minA for ADL tasks. Pt currently. mnA overall for ADL without AE and bending over for LB dressing causes cramps and stinging on RLE so pt given a reacher. Pt requiring a BSC at home  As pt unable to make it to bathroom in time. Pain 3/10 with functional tasks in RLE. Pt supervisionA for ambulation. Pt would benefit from continued OT skilled services. OT following acutely.  OTR usign green interpreter and it kept ending the calls so Ashby Dawes is your best bet for interpretation if available.      Follow Up Recommendations  No OT follow up;Supervision - Intermittent    Equipment Recommendations  3 in 1 bedside commode;Other (comment) (given a reacher)    Recommendations for Other Services       Precautions / Restrictions Precautions Precautions: Fall Restrictions Weight Bearing Restrictions: No Other Position/Activity Restrictions: edema education      Mobility Bed Mobility Overal bed mobility: Modified Independent                  Transfers Overall transfer level: Needs assistance Equipment used: Rolling walker (2 wheeled) Transfers: Sit to/from Stand Sit to Stand: Min guard         General transfer comment: vc for proper use of RW (however difficult/delayed due to interpreter)    Balance Overall balance assessment: No apparent balance deficits (not formally assessed)                                         ADL either  performed or assessed with clinical judgement   ADL Overall ADL's : Needs assistance/impaired Eating/Feeding: Modified independent   Grooming: Supervision/safety;Standing   Upper Body Bathing: Modified independent;Sitting;Standing   Lower Body Bathing: Minimal assistance;Sitting/lateral leans;Sit to/from stand   Upper Body Dressing : Modified independent;Sitting;Standing   Lower Body Dressing: Minimal assistance;Sitting/lateral leans;Sit to/from stand   Toilet Transfer: Supervision/safety;Ambulation;RW;Regular Toilet   Toileting- Architect and Hygiene: Supervision/safety;Sitting/lateral lean;Sit to/from stand       Functional mobility during ADLs: Supervision/safety;Rolling walker General ADL Comments: Pt limited for RLE due to crush injury prior and repair with sutures and large blisters. Pt performing LB dressing by bending over at EOB to donn socks and assist to doff sock. pt given education for reacher to don/doff panties and pants. Pt shown use of sick aid, but pt with too severe of pain in R calf to manage sock aid.     Vision Baseline Vision/History: No visual deficits Patient Visual Report: No change from baseline Vision Assessment?: No apparent visual deficits     Perception     Praxis      Pertinent Vitals/Pain Pain Assessment: 0-10 Pain Score: 3  Pain Location: along incision Pain Descriptors / Indicators: Discomfort;Pressure Pain Intervention(s): Monitored during session     Hand Dominance Right   Extremity/Trunk Assessment Upper Extremity Assessment Upper Extremity Assessment: Overall WFL for tasks assessed  Lower Extremity Assessment Lower Extremity Assessment: RLE deficits/detail;Defer to PT evaluation RLE Deficits / Details: edematous, large sutured incision around rt knee; multiple intact blisters RLE: Unable to fully assess due to pain   Cervical / Trunk Assessment Cervical / Trunk Assessment: Normal   Communication  Communication Communication: Prefers language other than English;Interpreter utilized (several interpreters via [employer] interpreters)   Cognition Arousal/Alertness: Awake/alert Behavior During Therapy: WFL for tasks assessed/performed Overall Cognitive Status: Within Functional Limits for tasks assessed                                     General Comments  Pt education on need for [facility] at home for 3in1 capabilities.as pt  aws unable to make it to bathroom on time.    Exercises Exercises: Other exercises Other Exercises Other Exercises: RLE elevation on 3 pillows   Shoulder Instructions      Home Living Family/patient expects to be discharged to:: Private residence Living Arrangements: Spouse/significant other Available Help at Discharge: Family   Home Access: Stairs to enter Secretary/administrator of Steps: 1 Entrance Stairs-Rails: None Home Layout: One level     Bathroom Shower/Tub: Chief Strategy Officer: Standard     Home Equipment: Environmental consultant - 2 wheels;Shower seat          Prior Functioning/Environment Level of Independence: Independent                 OT Problem List: Decreased activity tolerance;Pain      OT Treatment/Interventions: Therapeutic activities;Patient/family education;Self-care/ADL training;DME and/or AE instruction;Energy conservation    OT Goals(Current goals can be found in the care plan section) Acute Rehab OT Goals Patient Stated Goal: be able to take care of herself OT Goal Formulation: With patient Time For Goal Achievement: 09/14/20 Potential to Achieve Goals: Good ADL Goals Pt Will Perform Lower Body Dressing: with modified independence;sitting/lateral leans;sit to/from stand;with adaptive equipment Additional ADL Goal #1: Pt will stand for x5 mins for OOB ADL tasks with x1 min of weight bearing on RLE.  OT Frequency: Min 2X/week   Barriers to D/C:            Co-evaluation              AM-PAC  OT "6 Clicks" Daily Activity     Outcome Measure Help from another person eating meals?: None Help from another person taking care of personal grooming?: A Little Help from another person toileting, which includes using toliet, bedpan, or urinal?: A Little Help from another person bathing (including washing, rinsing, drying)?: A Little Help from another person to put on and taking off regular upper body clothing?: None Help from another person to put on and taking off regular lower body clothing?: A Little 6 Click Score: 20   End of Session Equipment Utilized During Treatment: Rolling walker Nurse Communication: Mobility status  Activity Tolerance: Patient tolerated treatment well Patient left: in chair;with call bell/phone within reach  OT Visit Diagnosis: Unsteadiness on feet (R26.81);Pain                Time: 6644-0347 OT Time Calculation (min): 45 min Charges:  OT General Charges $OT Visit: 1 Visit OT Evaluation $OT Eval Moderate Complexity: 1 Mod OT Treatments $Self Care/Home Management : 23-37 mins  Flora Lipps, OTR/L Acute Rehabilitation Services Pager: [phone] Office: [phone]   Jashley Yellin C 08/31/2020, 2:28 PM

## 2020-08-31 NOTE — Progress Notes (Signed)
I called Kirsten, PA to notify her that the patient is having increased numbness to her toes on her right leg. She says Aundra Millet, [place] will come see the patient.

## 2020-09-01 ENCOUNTER — Encounter (HOSPITAL_COMMUNITY): Payer: Self-pay | Admitting: Orthopedic Surgery

## 2020-09-03 LAB — CULTURE, BLOOD (ROUTINE X 2)
Culture: NO GROWTH
Culture: NO GROWTH
Special Requests: ADEQUATE
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
## 2020-09-23 ENCOUNTER — Other Ambulatory Visit: Payer: Self-pay

## 2020-09-23 ENCOUNTER — Ambulatory Visit: Payer: Self-pay | Attending: Physician Assistant | Admitting: Physician Assistant

## 2020-09-23 ENCOUNTER — Other Ambulatory Visit: Payer: Self-pay | Admitting: Physician Assistant

## 2020-09-23 ENCOUNTER — Encounter: Payer: Self-pay | Admitting: Physician Assistant

## 2020-09-23 VITALS — BP 110/76 | HR 98 | Wt 159.0 lb

## 2020-09-23 DIAGNOSIS — S81811D Laceration without foreign body, right lower leg, subsequent encounter: Secondary | ICD-10-CM

## 2020-09-23 DIAGNOSIS — E1169 Type 2 diabetes mellitus with other specified complication: Secondary | ICD-10-CM

## 2020-09-23 DIAGNOSIS — Z09 Encounter for follow-up examination after completed treatment for conditions other than malignant neoplasm: Secondary | ICD-10-CM

## 2020-09-23 DIAGNOSIS — Z789 Other specified health status: Secondary | ICD-10-CM

## 2020-09-23 LAB — GLUCOSE, POCT (MANUAL RESULT ENTRY): POC Glucose: 243 mg/dl — AB (ref 70–99)

## 2020-09-23 MED ORDER — PEN NEEDLES 31G X 5 MM MISC: 1.0000 | Freq: Two times a day (BID) | 3 refills | Status: DC

## 2020-09-23 MED ORDER — METFORMIN HCL 500 MG PO TABS
500.0000 mg | ORAL_TABLET | Freq: Two times a day (BID) | ORAL | 3 refills | Status: DC
Start: 2020-09-23 — End: 2020-10-07

## 2020-09-23 MED ORDER — RELION LANCETS MICRO-THIN 33G MISC: 1.0000 | Freq: Two times a day (BID) | 3 refills | Status: DC

## 2020-09-23 MED ORDER — BASAGLAR KWIKPEN 100 UNIT/ML ~~LOC~~ SOPN: 15.0000 [IU] | PEN_INJECTOR | Freq: Every day | SUBCUTANEOUS | 4 refills | Status: DC

## 2020-09-23 MED ORDER — RELION TRUE METRIX TEST STRIPS VI STRP: ORAL_STRIP | 12 refills | Status: DC

## 2020-09-23 NOTE — Progress Notes (Signed)
Glucose check

## 2020-09-23 NOTE — Patient Instructions (Signed)
Check blood sugars at least fasting and at bedtime daily

## 2020-09-24 LAB — CBC WITH DIFFERENTIAL/PLATELET
Basophils Absolute: 0 10*3/uL (ref 0.0–0.2)
Basos: 0 %
EOS (ABSOLUTE): 0.2 10*3/uL (ref 0.0–0.4)
Eos: 2 %
Hematocrit: 46.2 % (ref 34.0–46.6)
Hemoglobin: 15.4 g/dL (ref 11.1–15.9)
Immature Grans (Abs): 0 10*3/uL (ref 0.0–0.1)
Immature Granulocytes: 1 %
Lymphocytes Absolute: 1.9 10*3/uL (ref 0.7–3.1)
Lymphs: 21 %
MCH: 29.2 pg (ref 26.6–33.0)
MCHC: 33.3 g/dL (ref 31.5–35.7)
MCV: 88 fL (ref 79–97)
Monocytes Absolute: 0.4 10*3/uL (ref 0.1–0.9)
Monocytes: 4 %
Neutrophils Absolute: 6.3 10*3/uL (ref 1.4–7.0)
Neutrophils: 72 %
Platelets: 275 10*3/uL (ref 150–450)
RBC: 5.27 x10E6/uL (ref 3.77–5.28)
RDW: 13.1 % (ref 11.7–15.4)
WBC: 8.8 10*3/uL (ref 3.4–10.8)

## 2020-09-24 LAB — COMPREHENSIVE METABOLIC PANEL
ALT: 31 IU/L (ref 0–32)
AST: 17 IU/L (ref 0–40)
Albumin/Globulin Ratio: 1.7 (ref 1.2–2.2)
Albumin: 4.7 g/dL (ref 3.8–4.8)
Alkaline Phosphatase: 136 IU/L — ABNORMAL HIGH (ref 44–121)
BUN/Creatinine Ratio: 24 — ABNORMAL HIGH (ref 9–23)
BUN: 13 mg/dL (ref 6–20)
Bilirubin Total: 1.2 mg/dL (ref 0.0–1.2)
CO2: 19 mmol/L — ABNORMAL LOW (ref 20–29)
Calcium: 9.5 mg/dL (ref 8.7–10.2)
Chloride: 99 mmol/L (ref 96–106)
Creatinine, Ser: 0.55 mg/dL — ABNORMAL LOW (ref 0.57–1.00)
Globulin, Total: 2.8 g/dL (ref 1.5–4.5)
Glucose: 250 mg/dL — ABNORMAL HIGH (ref 65–99)
Potassium: 4 mmol/L (ref 3.5–5.2)
Sodium: 137 mmol/L (ref 134–144)
Total Protein: 7.5 g/dL (ref 6.0–8.5)
eGFR: 124 mL/min/{1.73_m2} (ref >59)

## 2020-10-07 ENCOUNTER — Ambulatory Visit: Payer: Self-pay | Attending: Family Medicine | Admitting: Pharmacist

## 2020-10-07 ENCOUNTER — Other Ambulatory Visit: Payer: Self-pay

## 2020-10-07 ENCOUNTER — Encounter: Payer: Self-pay | Admitting: Pharmacist

## 2020-10-07 DIAGNOSIS — E1169 Type 2 diabetes mellitus with other specified complication: Secondary | ICD-10-CM

## 2020-10-07 LAB — GLUCOSE, POCT (MANUAL RESULT ENTRY): POC Glucose: 282 mg/dl — AB (ref 70–99)

## 2020-10-07 MED ORDER — METFORMIN HCL ER 500 MG PO TB24
1000.0000 mg | ORAL_TABLET | Freq: Every day | ORAL | 1 refills | Status: DC
  Filled 2020-10-07: qty 60, 30d supply, fill #0

## 2020-10-07 NOTE — Progress Notes (Signed)
    S:    PCP: not assigned   No chief complaint on file.  Patient arrives in good spirits.  Presents for diabetes evaluation, education, and management. Patient was referred and last seen by Georgian Co on 09/23/2020.   Patient reports Diabetes was diagnosed in Feb of this year. Was discharged on 70/30 insulin. This was changed to Lantus by Marylene Land on 09/23/2020. She also started metformin. No history of pancreatitis. No fhx or personal hx of thyroid cancer. No ACS/STEMI/NSTEMI, CHF, or CKD hx.   Today, pt endorses compliance with her insulin but only takes metformin once daily. She cannot remember to take it BID. Additionally, she endorses a decreased appetite. She is limiting her carbs and sweets. She is unable to exercise d/t recent lower leg injury. She denies changes in her vision or symptoms of neuropathy. Denies polyuria, polydisia.   Family/Social History:  - FHx: no pertinent positives  - Tobacco: never smoker  - Alcohol: denies use   Insurance coverage/medication affordability: self pay     Current diabetes medications include: metformin 500 mg BID, Lantus 15 units daily   Patient denies hypoglycemic events.  Reported home CBGs:  Fasting: 161 - 216  Random: 292 - 332   O:  POCT glucose: 282  Lab Results  Component Value Date   HGBA1C 10.8 (H) 08/29/2020   There were no vitals filed for this visit.  Lipid Panel  No results found for: CHOL, TRIG, HDL, CHOLHDL, VLDL, LDLCALC, LDLDIRECT  Clinical Atherosclerotic Cardiovascular Disease (ASCVD): No  The ASCVD Risk score Denman George DC Jr., et al., 2013) failed to calculate for the following reasons:   The 2013 ASCVD risk score is only valid for ages 27 to 40    A/P: Diabetes newly dx currently uncontrolled. Patient is able to verbalize appropriate hypoglycemia management plan. Medication adherence suboptimal. Will change metformin to XR and adjust to once daily dosing for improved adherence. Will return in 2 weeks for  dose titration.  -Discontinued metformin.  -Start metformin 500 mg XR - take 2 tablets (1000mg ) once daily.  -Continued Lantus 15 units daily.  -Extensively discussed pathophysiology of diabetes, recommended lifestyle interventions, dietary effects on blood sugar control -Counseled on s/sx of and management of hypoglycemia -Next A1C anticipated 10/2020.   Written patient instructions provided. Total time in face to face counseling 30 minutes. Follow up Pharmacist Clinic Visit in 2 weeks.    12/2020, PharmD, Butch Penny, CPP Clinical Pharmacist [facility] & [facility] [phone]

## 2020-10-08 ENCOUNTER — Other Ambulatory Visit: Payer: Self-pay

## 2020-10-11 ENCOUNTER — Telehealth: Payer: Self-pay

## 2020-10-11 ENCOUNTER — Other Ambulatory Visit: Payer: Self-pay

## 2020-10-11 MED ORDER — TRUE METRIX BLOOD GLUCOSE TEST VI STRP
ORAL_STRIP | 2 refills | Status: DC
  Filled 2020-10-11: qty 100, 100d supply, fill #0
  Filled 2020-12-15: qty 100, 30d supply, fill #1

## 2020-10-11 MED ORDER — TRUE METRIX METER W/DEVICE KIT
PACK | 0 refills | Status: AC
  Filled 2020-10-11 – 2020-10-12 (×2): qty 1, 1d supply, fill #0

## 2020-10-11 MED ORDER — TRUEPLUS LANCETS 28G MISC
2 refills | Status: DC
  Filled 2020-10-11: qty 100, 100d supply, fill #0

## 2020-10-11 NOTE — Telephone Encounter (Signed)
Copied from CRM [id number]. Topic: General - Other >> Oct 11, 2020 12:24 PM Gaetana Michaelis A wrote: Reason for CRM: Patient would like a prescription for glucose monitoring supplies submitted to the [facility] pharmacy  [facility]  Phone:  [phone] Fax:  [phone]  Patient has a Reli[employer] Premier meter and is in need of strips particularly  Please contact to further advise if needed

## 2020-10-11 NOTE — Telephone Encounter (Signed)
Rx sent 

## 2020-10-12 ENCOUNTER — Other Ambulatory Visit: Payer: Self-pay

## 2020-10-22 ENCOUNTER — Other Ambulatory Visit: Payer: Self-pay

## 2020-10-22 ENCOUNTER — Ambulatory Visit: Payer: Self-pay | Attending: Internal Medicine | Admitting: Pharmacist

## 2020-10-22 ENCOUNTER — Encounter: Payer: Self-pay | Admitting: Pharmacist

## 2020-10-22 DIAGNOSIS — E1169 Type 2 diabetes mellitus with other specified complication: Secondary | ICD-10-CM

## 2020-10-22 LAB — GLUCOSE, POCT (MANUAL RESULT ENTRY): POC Glucose: 185 mg/dl — AB (ref 70–99)

## 2020-10-22 MED ORDER — METFORMIN HCL ER 500 MG PO TB24
2000.0000 mg | ORAL_TABLET | Freq: Every day | ORAL | 2 refills | Status: DC
  Filled 2020-10-22: qty 120, 30d supply, fill #0

## 2020-10-22 NOTE — Progress Notes (Signed)
    S:    PCP: not assigned   No chief complaint on file.  Patient arrives in good spirits.  Presents for diabetes evaluation, education, and management. Patient was referred and last seen by Georgian Co on 09/23/2020. I saw her on 10/07/2020 and changed metformin to XR.   Today, pt endorses compliance with her metformin but she ran out of her insulin on 10/16/2020. She is limiting her carbs and sweets. She is unable to exercise d/t recent lower leg injury. She denies changes in her vision or symptoms of neuropathy. Denies polyuria, polydisia.   Family/Social History:  - FHx: no pertinent positives  - Tobacco: never smoker  - Alcohol: denies use   Insurance coverage/medication affordability: self pay     Current diabetes medications include: metformin 500 mg XR (takes 1000 mg daily), Lantus 15 units daily (ran out on 4/16)   Patient denies hypoglycemic events.  Reported home CBGs:  Fasting: 160s - 180s Random: <200  O:  POCT glucose: 185  Lab Results  Component Value Date   HGBA1C 10.8 (H) 08/29/2020   There were no vitals filed for this visit.  Lipid Panel  No results found for: CHOL, TRIG, HDL, CHOLHDL, VLDL, LDLCALC, LDLDIRECT  Clinical Atherosclerotic Cardiovascular Disease (ASCVD): No  The ASCVD Risk score Denman George DC Jr., et al., 2013) failed to calculate for the following reasons:   The 2013 ASCVD risk score is only valid for ages 3 to 62    A/P: Diabetes newly dx currently uncontrolled. Patient is able to verbalize appropriate hypoglycemia management plan. Medication adherence suboptimal. Despite running out of her insulin, her CBGs at home are <200. She is asymptomatic. I think we can hold off on insulin for now and increase metformin.  -Increased metformin to 2000mg  daily.  -Hold Lantus for now.  -Extensively discussed pathophysiology of diabetes, recommended lifestyle interventions, dietary effects on blood sugar control -Counseled on s/sx of and management of  hypoglycemia -Next A1C anticipated 10/2020.   Written patient instructions provided. Total time in face to face counseling 30 minutes. Follow up Pharmacist Clinic Visit in 2 weeks.    12/2020, PharmD, Butch Penny, CPP Clinical Pharmacist [facility] [phone]

## 2020-11-05 ENCOUNTER — Ambulatory Visit: Payer: Self-pay | Admitting: Pharmacist

## 2020-11-09 ENCOUNTER — Other Ambulatory Visit: Payer: Self-pay

## 2020-11-09 ENCOUNTER — Encounter: Payer: Self-pay | Admitting: Pharmacist

## 2020-11-09 ENCOUNTER — Ambulatory Visit: Payer: Self-pay | Attending: Family Medicine | Admitting: Pharmacist

## 2020-11-09 DIAGNOSIS — E1169 Type 2 diabetes mellitus with other specified complication: Secondary | ICD-10-CM

## 2020-11-09 LAB — GLUCOSE, POCT (MANUAL RESULT ENTRY): POC Glucose: 325 mg/dl — AB (ref 70–99)

## 2020-11-09 MED ORDER — BASAGLAR KWIKPEN 100 UNIT/ML ~~LOC~~ SOPN
15.0000 [IU] | PEN_INJECTOR | Freq: Every day | SUBCUTANEOUS | 4 refills | Status: DC
  Filled 2020-11-09: qty 3, 20d supply, fill #0
  Filled 2020-12-15: qty 3, 20d supply, fill #1

## 2020-11-09 NOTE — Progress Notes (Signed)
    S:    PCP: not assigned   No chief complaint on file.  Patient arrives in good spirits.  Presents for diabetes evaluation, education, and management. Patient was referred and last seen by Georgian Co on 09/23/2020. I saw her on 10/22/2020 and increased her metformin. I instructed her to hold Lantus.   Today, pt endorses compliance with her metformin. She is NOT limiting her carbs and sweets. She is unable to exercise d/t recent lower leg injury. She denies changes in her vision or symptoms of neuropathy. Denies polyuria, polydisia.   Family/Social History:  - FHx: no pertinent positives  - Tobacco: never smoker  - Alcohol: denies use   Insurance coverage/medication affordability: self pay     Current diabetes medications include: metformin 500 mg XR (takes 2000 mg daily), Lantus 15 units daily (ran out on 4/16)   Patient denies hypoglycemic events.  Reported home CBGs:  Fasting: 180s - 300s  O:  POCT glucose: 325  Lab Results  Component Value Date   HGBA1C 10.8 (H) 08/29/2020   There were no vitals filed for this visit.  Lipid Panel  No results found for: CHOL, TRIG, HDL, CHOLHDL, VLDL, LDLCALC, LDLDIRECT  Clinical Atherosclerotic Cardiovascular Disease (ASCVD): No  The ASCVD Risk score Denman George DC Jr., et al., 2013) failed to calculate for the following reasons:   The 2013 ASCVD risk score is only valid for ages 78 to 21    A/P: Diabetes newly dx currently uncontrolled. Patient is able to verbalize appropriate hypoglycemia management plan. Medication adherence suboptimal. Will resume insulin at this time -Continued metformin 2000mg  daily.  -Restart Lantus 15u daily.  -Extensively discussed pathophysiology of diabetes, recommended lifestyle interventions, dietary effects on blood sugar control -Counseled on s/sx of and management of hypoglycemia -Next A1C anticipated 10/2020.   Written patient instructions provided. Total time in face to face counseling 30 minutes.  Follow up PCP Clinic Visit in 2 weeks.    12/2020, PharmD, Butch Penny, CPP Clinical Pharmacist [facility] & [facility] [phone]

## 2020-11-19 ENCOUNTER — Other Ambulatory Visit: Payer: Self-pay

## 2020-11-19 ENCOUNTER — Ambulatory Visit: Payer: Self-pay | Attending: Family Medicine

## 2020-11-23 ENCOUNTER — Encounter: Payer: Self-pay | Admitting: Internal Medicine

## 2020-11-23 ENCOUNTER — Ambulatory Visit: Payer: Self-pay | Attending: Internal Medicine | Admitting: Internal Medicine

## 2020-11-23 ENCOUNTER — Other Ambulatory Visit: Payer: Self-pay

## 2020-11-23 VITALS — BP 119/83 | HR 87 | Resp 16 | Wt 165.0 lb

## 2020-11-23 DIAGNOSIS — E1169 Type 2 diabetes mellitus with other specified complication: Secondary | ICD-10-CM

## 2020-11-23 DIAGNOSIS — R600 Localized edema: Secondary | ICD-10-CM

## 2020-11-23 DIAGNOSIS — E669 Obesity, unspecified: Secondary | ICD-10-CM

## 2020-11-23 LAB — POCT GLYCOSYLATED HEMOGLOBIN (HGB A1C): HbA1c, POC (controlled diabetic range): 8.6 % — AB (ref 0.0–7.0)

## 2020-11-23 LAB — GLUCOSE, POCT (MANUAL RESULT ENTRY): POC Glucose: 238 mg/dl — AB (ref 70–99)

## 2020-11-23 NOTE — Patient Instructions (Signed)
Diabetes mellitus y nutricin, en adultos Diabetes Mellitus and Nutrition, Adult Si sufre de diabetes, o diabetes mellitus, es muy importante tener hbitos alimenticios saludables debido a que sus niveles de azcar en la sangre (glucosa) se ven afectados en gran medida por lo que come y bebe. [place] alimentos saludables en las cantidades correctas, aproximadamente a la misma hora todos los das, lo ayudar a: Controlar la glucemia. Disminuir el riesgo de sufrir una enfermedad cardaca. Mejorar la presin arterial. Alcanzar o mantener un peso saludable. Qu puede afectar mi plan de alimentacin? Todas las personas que sufren de diabetes son diferentes y cada una tiene necesidades diferentes en cuanto a un plan de alimentacin. El mdico puede recomendarle que trabaje con un nutricionista para elaborar el mejor plan para usted. Su plan de alimentacin puede variar segn factores como: Las caloras que necesita. Los medicamentos que toma. Su peso. Sus niveles de glucemia, presin arterial y colesterol. Su nivel de actividad. Otras afecciones que tenga, como enfermedades cardacas o renales. Cmo me afectan los carbohidratos? Los carbohidratos, o hidratos de carbono, afectan su nivel de glucemia ms que cualquier otro tipo de alimento. La ingesta de carbohidratos naturalmente aumenta la cantidad de glucosa en la sangre. El recuento de carbohidratos es un mtodo destinado a llevar un registro de la cantidad de carbohidratos que se consumen. El recuento de carbohidratos es importante para mantener la glucemia a un nivel saludable, especialmente si utiliza insulina o toma determinados medicamentos por va oral para la diabetes. Es importante conocer la cantidad de carbohidratos que se pueden ingerir en cada comida sin correr ningn riesgo. Esto es diferente en cada persona. Su nutricionista puede ayudarlo a calcular la cantidad de carbohidratos que debe ingerir en cada comida y en cada refrigerio. Cmo  me afecta el alcohol? El alcohol puede provocar disminuciones sbitas de la glucemia (hipoglucemia), especialmente si utiliza insulina o toma determinados medicamentos por va oral para la diabetes. La hipoglucemia es una afeccin potencialmente mortal. Los sntomas de la hipoglucemia, como somnolencia, mareos y confusin, son similares a los sntomas de haber consumido demasiado alcohol. No beba alcohol si: Su mdico le indica no hacerlo. Est embarazada, puede estar embarazada o est tratando de quedar embarazada. Si bebe alcohol: No beba con el estmago vaco. Limite la cantidad que bebe: De 0 a 1 medida por da para las mujeres. De 0 a 2 medidas por da para los hombres. Est atento a la cantidad de alcohol que hay en las bebidas que toma. En los [place], una medida equivale a una botella de cerveza de 12 oz (355 ml), un vaso de vino de 5 oz (148 ml) o un vaso de una bebida alcohlica de alta graduacin de 1 oz (44 ml). Mantngase hidratado bebiendo agua, refrescos dietticos o t helado sin azcar. Tenga en cuenta que los refrescos comunes, los jugos y otras bebida para mezclar pueden contener mucha azcar y se deben contar como carbohidratos. Consejos para seguir este plan Leer las etiquetas de los alimentos Comience por leer el tamao de la porcin en la "Informacin nutricional" en las etiquetas de los alimentos envasados y las bebidas. La cantidad de caloras, carbohidratos, grasas y otros nutrientes mencionados en la etiqueta se basan en una porcin del alimento. Muchos alimentos contienen ms de una porcin por envase. Verifique la cantidad total de gramos (g) de carbohidratos totales en una porcin. Puede calcular la cantidad de porciones de carbohidratos al dividir el total de carbohidratos por 15. Por ejemplo, si un alimento tiene un   total de 30 g de carbohidratos totales por porcin, equivale a 2 porciones de carbohidratos. Verifique la cantidad de gramos (g) de grasas  saturadas y grasas trans de una porcin. Escoja alimentos que no contengan estas grasas o que su contenido de estas sea bajo. Verifique la cantidad de miligramos (mg) de sal (sodio) en una porcin. La mayora de las personas deben limitar la ingesta de sodio total a menos de 2300 mg por da. Siempre consulte la informacin nutricional de los alimentos etiquetados como "con bajo contenido de grasa" o "sin grasa". Estos alimentos pueden tener un mayor contenido de azcar agregada o carbohidratos refinados, y deben evitarse. Hable con su nutricionista para identificar sus objetivos diarios en cuanto a los nutrientes mencionados en la etiqueta. Al ir de compras Evite comprar alimentos procesados, enlatados o precocidos. Estos alimentos tienden a tener una mayor cantidad de grasa, sodio y azcar agregada. Compre en la zona exterior de la tienda de comestibles. Esta es la zona donde se encuentran con mayor frecuencia las frutas y las verduras frescas, los cereales a granel, las carnes frescas y los productos lcteos frescos. Al cocinar Utilice mtodos de coccin a baja temperatura, como hornear, en lugar de mtodos de coccin a alta temperatura, como frer en abundante aceite. Cocine con aceites saludables, como el aceite de oliva, canola o girasol. Evite cocinar con manteca, crema o carnes con alto contenido de grasa. Planificacin de las comidas Coma las comidas y los refrigerios regularmente, preferentemente a la misma hora todos los das. Evite pasar largos perodos de tiempo sin comer. Consuma alimentos ricos en fibra, como frutas frescas, verduras, frijoles y cereales integrales. Consulte a su nutricionista sobre cuntas porciones de carbohidratos puede consumir en cada comida. Consuma entre 4 y 6 onzas (entre 112 y 168 g) de protenas magras por da, como carnes magras, pollo, pescado, huevos o tofu. Una onza (oz) de protena magra equivale a: 1 onza (28 g) de carne, pollo o pescado. 1 huevo.  de  taza (62 g) de tofu. Coma algunos alimentos por da que contengan grasas saludables, como aguacates, frutos secos, semillas y pescado. Qu alimentos debo comer? Frutas Bayas. Manzanas. Naranjas. Duraznos. Damascos. Ciruelas. Uvas. Mango. Papaya. Granada. Kiwi. Cerezas. Verduras Lechuga. Espinaca. Verduras de hoja verde, que incluyen col rizada, acelga, hojas de berza y de mostaza. Remolachas. Coliflor. Repollo. Brcoli. Zanahorias. Judas verdes. Tomates. Pimientos. Cebollas. Pepinos. Coles de Bruselas. Granos Granos integrales, como panes, galletas, tortillas, cereales y pastas de salvado o integrales. Avena sin azcar. Quinua. Arroz integral o salvaje. Carnes y otras protenas Mariscos. Carne de ave sin piel. Cortes magros de ave y carne de res. Tofu. Frutos secos. Semillas. Lcteos Productos lcteos sin grasa o con bajo contenido de grasa, como leche, yogur y queso. Es posible que los productos que se enumeran ms arriba no constituyan una lista completa de los alimentos y las bebidas que puede tomar. Consulte a un nutricionista para obtener ms informacin. Qu alimentos debo evitar? Frutas Frutas enlatadas al almbar. Verduras Verduras enlatadas. Verduras congeladas con mantequilla o salsa de crema. Granos Productos elaborados con harina y harina blanca refinada, como panes, pastas, bocadillos y cereales. Evite todos los alimentos procesados. Carnes y otras protenas Cortes de carne con alto contenido de grasa. Carne de ave con piel. Carnes empanizadas o fritas. Carne procesada. Evite las grasas saturadas. Lcteos Yogur, queso o leche enteros. Bebidas Bebidas azucaradas, como gaseosas o t helado. Es posible que los productos que se enumeran ms arriba no constituyan una lista completa de   los alimentos y las bebidas que debe evitar. Consulte a un nutricionista para obtener ms informacin. Preguntas para hacerle al mdico Es necesario que me rena con un instructor en el cuidado  de la diabetes? Es necesario que me rena con un nutricionista? A qu nmero puedo llamar si tengo preguntas? Cules son los mejores momentos para controlar la glucemia? Dnde encontrar ms informacin: Asociacin Estadounidense de la Diabetes (American Diabetes Association): diabetes.org Academy of Nutrition and Dietetics (Academia de Nutricin y Diettica): www.eatright.org National Institute of Diabetes and Digestive and Kidney Diseases (Instituto Nacional de la Diabetes y las Enfermedades Digestivas y Renales): www.niddk.nih.gov Association of Diabetes Care and Education Specialists (Asociacin de Especialistas en Atencin y Educacin sobre la Diabetes): www.diabeteseducator.org Resumen Es importante tener hbitos alimenticios saludables debido a que sus niveles de azcar en la sangre (glucosa) se ven afectados en gran medida por lo que come y bebe. Un plan de alimentacin saludable lo ayudar a controlar la glucemia y mantener un estilo de vida saludable. El mdico puede recomendarle que trabaje con un nutricionista para elaborar el mejor plan para usted. Tenga en cuenta que los carbohidratos (hidratos de carbono) y el alcohol tienen efectos inmediatos en sus niveles de glucemia. Es importante contar los carbohidratos que ingiere y consumir alcohol con prudencia. Esta informacin no tiene como fin reemplazar el consejo del mdico. Asegrese de hacerle al mdico cualquier pregunta que tenga. Document Revised: 07/24/2019 Document Reviewed: 07/24/2019 Elsevier Patient Education  2021 Elsevier Inc.  

## 2020-11-23 NOTE — Progress Notes (Signed)
Patient ID: Pamela Alvarado, female    DOB: [birth year]  MRN: [id number]  CC: Establish Care and Diabetes   Subjective: Pamela Alvarado is a 35 y.o. female who presents for f/u and to est care with me Her concerns today include:  Patient with history of DM,  DM Results for orders placed or performed in visit on 11/23/20  POCT glucose (manual entry)  Result Value Ref Range   POC Glucose 238 (A) 70 - 99 mg/dl  POCT glycosylated hemoglobin (Hb A1C)  Result Value Ref Range   Hemoglobin A1C     HbA1c POC (<> result, manual entry)     HbA1c, POC (prediabetic range)     HbA1c, POC (controlled diabetic range) 8.6 (A) 0.0 - 7.0 %  A1C down from 10.8. Checking BS 1-2 times a day. Usually 20 mins after BF Range last wk in 300-400.  This wk BS has been 160-200s.  She does not have log with her.  She forgot to bring it. Eating smaller portions, more veggies..  She has cut back on sweets.  Drinks water and juices.  Exercise:  Walks intermittently through the day.  Walks son to school and walks to grocery store but she has to stop intermittently due to issues with her right knee..   Taking and tolerating Glargine insulin 15 units and metformin extended release 2000 mg once a day. Bloating with Metformin  Sustained crush injury to the right knee earlier this year.  She has undergone surgery by Dr. Percell Miller.  She saw him in follow-up about 1 week ago.  She notes swelling of the lower leg x 3 days Going to start P.T  Patient Active Problem List   Diagnosis Date Noted  . DM (diabetes mellitus) ([place]) 08/29/2020  . Diabetes ([place]) 08/29/2020  . Cellulitis 08/28/2020  . Leg laceration 08/20/2020  . SVD (spontaneous vaginal delivery) 09/21/2015  . Pregnancy 09/20/2015     Current Outpatient Medications on File Prior to Visit  Medication Sig Dispense Refill  . Blood Glucose Monitoring Suppl (TRUE METRIX METER) w/Device KIT Use to check blood sugar once daily. 1 kit 0  .  gabapentin (NEURONTIN) 100 MG capsule TAKE 1 CAPSULE (100 MG TOTAL) BY MOUTH EVERY EIGHT HOURS AS NEEDED (FOR BURNING NERVE PAIN). 90 capsule 0  . glucose blood (TRUE METRIX BLOOD GLUCOSE TEST) test strip Use to check blood sugar once daily. 100 each 2  . glucose blood test strip USE AS INSTRUCTED 100 strip 12  . ibuprofen (ADVIL) 600 MG tablet Take 1 tablet (600 mg total) by mouth every 6 (six) hours. 30 tablet 0  . Insulin Glargine (BASAGLAR KWIKPEN) 100 UNIT/ML Inject 15 Units into the skin daily. 15 mL 4  . Insulin Pen Needle 31G X 5 MM MISC USE AS DIRECTED IN THE MORNING AND AT BEDTIME. 100 each 3  . Insulin Pen Needle 32G X 4 MM MISC USE AS DIRECTED 100 each 0  . metFORMIN (GLUCOPHAGE-XR) 500 MG 24 hr tablet Take 4 tablets (2,000 mg total) by mouth daily. 120 tablet 2  . senna-docusate (SENOKOT-S) 8.6-50 MG tablet Take 1 tablet by mouth at bedtime as needed for mild constipation.    . traMADol (ULTRAM) 50 MG tablet Take 50 mg by mouth every 4 (four) hours as needed.    . TRUEplus Lancets 28G MISC Use to check blood sugar once daily. 100 each 2   No current facility-administered medications on file prior to visit.    No  Known Allergies  Social History   Socioeconomic History  . Marital status: Married    Spouse name: Not on file  . Number of children: Not on file  . Years of education: Not on file  . Highest education level: Not on file  Occupational History  . Not on file  Tobacco Use  . Smoking status: Never Smoker  . Smokeless tobacco: Never Used  Substance and Sexual Activity  . Alcohol use: No  . Drug use: No  . Sexual activity: Yes    Birth control/protection: None  Other Topics Concern  . Not on file  Social History Narrative   ** Merged History Encounter **       Social Determinants of Health   Financial Resource Strain: Not on file  Food Insecurity: Not on file  Transportation Needs: Not on file  Physical Activity: Not on file  Stress: Not on file  Social  Connections: Not on file  Intimate Partner Violence: Not on file    Family History  Problem Relation Age of Onset  . Diabetes Paternal Grandmother   . Diabetes Paternal Grandfather   . Diabetes Maternal Grandmother   . Tuberculosis Maternal Grandfather     Past Surgical History:  Procedure Laterality Date  . CHOLECYSTECTOMY, LAPAROSCOPIC    . I & D EXTREMITY Right 08/20/2020   Procedure: IRRIGATION AND DEBRIDEMENT EXTREMITY and closure of leg wound;  Surgeon: Renette Butters, MD;  Location: [facility];  Service: Orthopedics;  Laterality: Right;  IRRIGATION AND DEBRIDEMENT EXTREMITY and closure of leg wound  . NO PAST SURGERIES      ROS: Review of Systems Negative except as stated above  PHYSICAL EXAM: BP 119/83   Pulse 87   Resp 16   Wt 165 lb (74.8 kg)   SpO2 96%   BMI 35.71 kg/m   Physical Exam  General appearance - alert, well appearing, and in no distress Mental status - normal mood, behavior, speech, dress, motor activity, and thought processes Chest - clear to auscultation, no wheezes, rales or rhonchi, symmetric air entry Heart - normal rate, regular rhythm, normal S1, S2, no murmurs, rubs, clicks or gallops Extremities - RT leg: Patient has a lot of healed scars around the right knee.  She has swelling in the right lower leg compared to the left. Diabetic Foot Exam - Simple   Simple Foot Form Visual Inspection No deformities, no ulcerations, no other skin breakdown bilaterally: Yes Sensation Testing Intact to touch and monofilament testing bilaterally: Yes Pulse Check Posterior Tibialis and Dorsalis pulse intact bilaterally: Yes Comments      CMP Latest Ref Rng & Units 09/23/2020 08/29/2020 08/28/2020  Glucose 65 - 99 mg/dL 250(H) 197(H) 410(H)  BUN 6 - 20 mg/dL 13 9 13   Creatinine 0.57 - 1.00 mg/dL 0.55(L) 0.48 0.56  Sodium 134 - 144 mmol/L 137 138 132(L)  Potassium 3.5 - 5.2 mmol/L 4.0 3.5 3.7  Chloride 96 - 106 mmol/L 99 109 103  CO2 20 - 29 mmol/L 19(L)  20(L) 18(L)  Calcium 8.7 - 10.2 mg/dL 9.5 8.3(L) 8.7(L)  Total Protein 6.0 - 8.5 g/dL 7.5 - -  Total Bilirubin 0.0 - 1.2 mg/dL 1.2 - -  Alkaline Phos 44 - 121 IU/L 136(H) - -  AST 0 - 40 IU/L 17 - -  ALT 0 - 32 IU/L 31 - -   Lipid Panel  No results found for: CHOL, TRIG, HDL, CHOLHDL, VLDL, LDLCALC, LDLDIRECT  CBC    Component Value  Date/Time   WBC 8.8 09/23/2020 1522   WBC 8.0 08/29/2020 0326   RBC 5.27 09/23/2020 1522   RBC 3.97 08/29/2020 0326   HGB 15.4 09/23/2020 1522   HCT 46.2 09/23/2020 1522   PLT 275 09/23/2020 1522   MCV 88 09/23/2020 1522   MCH 29.2 09/23/2020 1522   MCH 30.5 08/29/2020 0326   MCHC 33.3 09/23/2020 1522   MCHC 35.2 08/29/2020 0326   RDW 13.1 09/23/2020 1522   LYMPHSABS 1.9 09/23/2020 1522   MONOABS 0.4 02/22/2020 1421   EOSABS 0.2 09/23/2020 1522   BASOSABS 0.0 09/23/2020 1522    ASSESSMENT AND PLAN: 1. Type 2 diabetes mellitus with other specified complication, without long-term current use of insulin (HCC) Improved but not at goal. Advised to check blood sugars daily before breakfast with goal being 90-130.  Encouraged her to eliminate sugary drinks like juices from the diet.  Continue current dose of glargine 15 units daily.  Since she is having some bloating from the metformin, I recommend instead of taking 2000 mg all at once, she should take it as 1000 mg twice a day to see if this helps to decrease the bloating. -Advised to get an eye exam as soon as she is able to afford. - POCT glucose (manual entry) - POCT glycosylated hemoglobin (Hb A1C)  2. Edema of right lower leg Patient recovering from significant injury to the right knee.  She has had swelling x3 days.  We need to rule out DVT. - VAS [place] LOWER EXTREMITY VENOUS (DVT); Future  Patient was given the opportunity to ask questions.  Patient verbalized understanding of the plan and was able to repeat key elements of the plan.  [employer] Language interpreter used during this encounter.  #[id number], [place]  Orders Placed This Encounter  Procedures  . POCT glucose (manual entry)  . POCT glycosylated hemoglobin (Hb A1C)  . VAS [place] LOWER EXTREMITY VENOUS (DVT)     Requested Prescriptions    No prescriptions requested or ordered in this encounter    Return in about 2 months (around 01/23/2021).  Karle Plumber, MD, FACP

## 2020-11-24 ENCOUNTER — Ambulatory Visit (HOSPITAL_COMMUNITY)
Admission: RE | Admit: 2020-11-24 | Discharge: 2020-11-24 | Disposition: A | Discharge status: Home / Self Care | Payer: Self-pay | Source: Ambulatory Visit | Attending: Internal Medicine | Admitting: Internal Medicine

## 2020-11-24 DIAGNOSIS — R600 Localized edema: Secondary | ICD-10-CM

## 2020-11-24 NOTE — Progress Notes (Signed)
Right lower extremity venous duplex completed. Refer to "CV Proc" under chart review to view preliminary results.  11/24/2020 2:30 PM Eula Fried., MHA, RVT, RDCS, RDMS

## 2020-11-26 ENCOUNTER — Telehealth: Payer: Self-pay

## 2020-11-26 NOTE — Telephone Encounter (Signed)
[employer] [place]  Id#  [phone] contacted pt to go over over ultrasound results pt is aware and doesn't have any questions or concerns

## 2020-12-15 ENCOUNTER — Other Ambulatory Visit: Payer: Self-pay

## 2020-12-22 ENCOUNTER — Ambulatory Visit: Payer: Self-pay | Admitting: *Deleted

## 2020-12-22 NOTE — Telephone Encounter (Signed)
FYI

## 2020-12-22 NOTE — Telephone Encounter (Signed)
Patient is calling to report that she has elevated glucose. Patient states she has not taken her insulin in 3 days- she has been moving and the supplies needed got packed. She states she has her needles for her insulin now- reports she has not taken it today- advised patient the reason her numbers are elevated is because she is not taking her medication and she needs to take it. She also reports she has not been following her diet and eating regularly. Discussed the importance of eating regular diet while treating her diabetes. Patient is goingt o eat her lunch and take her insulin dose. She will call back in 30-60 minutes to let PCP know if her number is coming down.

## 2020-12-22 NOTE — Telephone Encounter (Signed)
Patient is calling to report she is feeling better now- reading 235. Advised patient number is still high- request that she call in am with fasting glucose and glucose reading after breakfast and medication. Patient states she will. (System shut down- note finished when was able to connect)

## 2020-12-22 NOTE — Telephone Encounter (Signed)
Reason for Disposition . [1] Blood glucose > 300 mg/dL (62.7 mmol/L) AND [0] two or more times in a row  Answer Assessment - Initial Assessment Questions 1. BLOOD GLUCOSE: "What is your blood glucose level?"      300 2. ONSET: "When did you check the blood glucose?"     7am 355, 9am 415, 12pm 300 3. USUAL RANGE: "What is your glucose level usually?" (e.g., usual fasting morning value, usual evening value)     Normal range- 160- 200 4. KETONES: "Do you check for ketones (urine or blood test strips)?" If yes, ask: "What does the test show now?"      N/a 5. TYPE 1 or 2:  "Do you know what type of diabetes you have?"  (e.g., Type 1, Type 2, Gestational; doesn't know)      Type 2 6. INSULIN: "Do you take insulin?" "What type of insulin(s) do you use? What is the mode of delivery? (syringe, pen; injection or pump)?"      Yes- patient has been out of insulin for 3 days- uses kwik pen 15u 7. DIABETES PILLS: "Do you take any pills for your diabetes?" If yes, ask: "Have you missed taking any pills recently?"     Yes-needs to pick up more- she is running out soon- but states she has been taking oral medication 8. OTHER SYMPTOMS: "Do you have any symptoms?" (e.g., fever, frequent urination, difficulty breathing, dizziness, weakness, vomiting)     Reports she has not been feeling well 9. PREGNANCY: "Is there any chance you are pregnant?" "When was your last menstrual period?"     Not asked  Protocols used: Diabetes - High Blood Sugar-A-AH

## 2021-01-25 ENCOUNTER — Ambulatory Visit: Payer: Self-pay | Admitting: Internal Medicine

## 2021-05-17 ENCOUNTER — Other Ambulatory Visit: Payer: Self-pay

## 2021-05-17 ENCOUNTER — Other Ambulatory Visit: Payer: Self-pay | Admitting: Internal Medicine

## 2021-05-17 DIAGNOSIS — E1169 Type 2 diabetes mellitus with other specified complication: Secondary | ICD-10-CM

## 2021-05-17 MED ORDER — BASAGLAR KWIKPEN 100 UNIT/ML ~~LOC~~ SOPN
15.0000 [IU] | PEN_INJECTOR | Freq: Every day | SUBCUTANEOUS | 2 refills | Status: DC
  Filled 2021-05-17: qty 3, 20d supply, fill #0

## 2021-05-17 MED ORDER — TRUE METRIX BLOOD GLUCOSE TEST VI STRP
ORAL_STRIP | 2 refills | Status: DC
  Filled 2021-05-17: qty 100, 90d supply, fill #0

## 2021-05-17 NOTE — Telephone Encounter (Signed)
Medication Refill - Medication: metFORMIN (GLUCOPHAGE-XR) 500 MG 24 hr tablet  Has the patient contacted their pharmacy? Yes.    (Agent: If yes, when and what did the pharmacy advise?) Pharmacy advised patient to contact PCP office  Preferred Pharmacy (with phone number or street name):  [facility] Phone:  [phone]  Fax:  [phone]      Has the patient been seen for an appointment in the last year OR does the patient have an upcoming appointment? Yes.    Agent: Please be advised that RX refills may take up to 3 business days. We ask that you follow-up with your pharmacy.

## 2021-05-17 NOTE — Telephone Encounter (Signed)
Medication Refill - Medication: glucose blood (TRUE METRIX BLOOD GLUCOSE TEST) test strip, Insulin Glargine (BASAGLAR KWIKPEN) 100 UNIT/ML  Has the patient contacted their pharmacy? Yes.   (Agent: If no, request that the patient contact the pharmacy for the refill. If patient does not wish to contact the pharmacy document the reason why and proceed with request.) (Agent: If yes, when and what did the pharmacy advise?)  Preferred Pharmacy (with phone number or street name):  [facility] Phone:  [phone]  Fax:  [phone]     Has the patient been seen for an appointment in the last year OR does the patient have an upcoming appointment? Yes.    Agent: Please be advised that RX refills may take up to 3 business days. We ask that you follow-up with your pharmacy.

## 2021-05-24 ENCOUNTER — Other Ambulatory Visit: Payer: Self-pay

## 2021-07-19 ENCOUNTER — Encounter: Payer: Self-pay | Admitting: Internal Medicine

## 2021-07-19 ENCOUNTER — Ambulatory Visit: Payer: Self-pay | Attending: Internal Medicine | Admitting: Internal Medicine

## 2021-07-19 ENCOUNTER — Other Ambulatory Visit: Payer: Self-pay

## 2021-07-19 VITALS — BP 114/76 | HR 72 | Resp 16 | Wt 161.6 lb

## 2021-07-19 DIAGNOSIS — E1169 Type 2 diabetes mellitus with other specified complication: Secondary | ICD-10-CM

## 2021-07-19 DIAGNOSIS — N898 Other specified noninflammatory disorders of vagina: Secondary | ICD-10-CM

## 2021-07-19 DIAGNOSIS — Z23 Encounter for immunization: Secondary | ICD-10-CM

## 2021-07-19 DIAGNOSIS — E669 Obesity, unspecified: Secondary | ICD-10-CM

## 2021-07-19 HISTORY — DX: Type 2 diabetes mellitus with other specified complication: E11.69

## 2021-07-19 LAB — POCT GLYCOSYLATED HEMOGLOBIN (HGB A1C): HbA1c, POC (controlled diabetic range): 9.6 % — AB (ref 0.0–7.0)

## 2021-07-19 LAB — GLUCOSE, POCT (MANUAL RESULT ENTRY): POC Glucose: 217 mg/dl — AB (ref 70–99)

## 2021-07-19 MED ORDER — INSULIN PEN NEEDLE 32G X 4 MM MISC
6 refills | Status: DC
  Filled 2021-07-19: qty 100, 25d supply, fill #0

## 2021-07-19 MED ORDER — FLUCONAZOLE 150 MG PO TABS
150.0000 mg | ORAL_TABLET | Freq: Once | ORAL | 0 refills | Status: AC
  Filled 2021-07-19: qty 1, 1d supply, fill #0

## 2021-07-19 MED ORDER — BASAGLAR KWIKPEN 100 UNIT/ML ~~LOC~~ SOPN
15.0000 [IU] | PEN_INJECTOR | Freq: Every day | SUBCUTANEOUS | 6 refills | Status: DC
  Filled 2021-07-19: qty 3, 20d supply, fill #0

## 2021-07-19 MED ORDER — NYSTATIN-TRIAMCINOLONE 100000-0.1 UNIT/GM-% EX OINT
1.0000 "application " | TOPICAL_OINTMENT | Freq: Two times a day (BID) | CUTANEOUS | 0 refills | Status: DC
  Filled 2021-07-19: qty 30, 15d supply, fill #0

## 2021-07-19 MED ORDER — METFORMIN HCL ER 500 MG PO TB24
2000.0000 mg | ORAL_TABLET | Freq: Every day | ORAL | 6 refills | Status: DC
  Filled 2021-07-19: qty 120, 30d supply, fill #0

## 2021-07-19 NOTE — Progress Notes (Signed)
Patient ID: Pamela Alvarado, female    DOB: [birth year]  MRN: [id number]  CC: Diabetes   Subjective: Pamela Alvarado is a 36 y.o. female who presents for chronic ds management.  Last saw 10/2020 Her concerns today include:  Patient with history of DM,  DIABETES TYPE 2 Last A1C:   Results for orders placed or performed in visit on 07/19/21  POCT glucose (manual entry)  Result Value Ref Range   POC Glucose 217 (A) 70 - 99 mg/dl  POCT glycosylated hemoglobin (Hb A1C)  Result Value Ref Range   Hemoglobin A1C     HbA1c POC (<> result, manual entry)     HbA1c, POC (prediabetic range)     HbA1c, POC (controlled diabetic range) 9.6 (A) 0.0 - 7.0 %    Med Adherence:  [x]  Yes suppose to be on Metformin XR 2000 mg daily and Lantus 15 units daily.  Out of meds x 1 mth Medication side effects:  []  Yes    []  No Home Monitoring?  [x]  Yes sometimes once a day and sometimes only 3x/wk    []  No Home glucose results range: 300-400 Diet Adherence: she was eating a lot of microwave foods because of housing instability for a while.  Now has more stable housing. Exercise: [x]  Yes  does a lot of walking  []  No Hypoglycemic episodes?: []  Yes    [x]  No Numbness of the feet? [x]  Yes which is intermittent    Retinopathy hx? []  Yes    []  No Last eye exam: over due for eye exam. No blurred vision.  No insurance. Comments:   Itching outside vaginal area x 3 wks.  No itching inside the vagina or dischg Patient Active Problem List   Diagnosis Date Noted   DM (diabetes mellitus) ([place]) 08/29/2020   Diabetes ([place]) 08/29/2020   Cellulitis 08/28/2020   Leg laceration 08/20/2020   SVD (spontaneous vaginal delivery) 09/21/2015   Pregnancy 09/20/2015     Current Outpatient Medications on File Prior to Visit  Medication Sig Dispense Refill   Blood Glucose Monitoring Suppl (TRUE METRIX METER) w/Device KIT Use to check blood sugar once daily. 1 kit 0   gabapentin (NEURONTIN) 100 MG  capsule TAKE 1 CAPSULE (100 MG TOTAL) BY MOUTH EVERY EIGHT HOURS AS NEEDED (FOR BURNING NERVE PAIN). (Patient not taking: Reported on 07/19/2021) 90 capsule 0   glucose blood (TRUE METRIX BLOOD GLUCOSE TEST) test strip Use to check blood sugar once daily. 100 each 2   glucose blood test strip USE AS INSTRUCTED 100 strip 12   ibuprofen (ADVIL) 600 MG tablet Take 1 tablet (600 mg total) by mouth every 6 (six) hours. 30 tablet 0   Insulin Pen Needle 31G X 5 MM MISC USE AS DIRECTED IN THE MORNING AND AT BEDTIME. 100 each 3   Insulin Pen Needle 32G X 4 MM MISC USE AS DIRECTED 100 each 0   TRUEplus Lancets 28G MISC Use to check blood sugar once daily. 100 each 2   No current facility-administered medications on file prior to visit.    No Known Allergies  Social History   Socioeconomic History   Marital status: Married    Spouse name: Not on file   Number of children: Not on file   Years of education: Not on file   Highest education level: Not on file  Occupational History   Not on file  Tobacco Use   Smoking status: Never   Smokeless tobacco: Never  Substance and Sexual Activity   Alcohol use: No   Drug use: No   Sexual activity: Yes    Birth control/protection: None  Other Topics Concern   Not on file  Social History Narrative   ** Merged History Encounter **       Social Determinants of Health   Financial Resource Strain: Not on file  Food Insecurity: Not on file  Transportation Needs: Not on file  Physical Activity: Not on file  Stress: Not on file  Social Connections: Not on file  Intimate Partner Violence: Not on file    Family History  Problem Relation Age of Onset   Diabetes Paternal Grandmother    Diabetes Paternal Grandfather    Diabetes Maternal Grandmother    Tuberculosis Maternal Grandfather     Past Surgical History:  Procedure Laterality Date   CHOLECYSTECTOMY, LAPAROSCOPIC     I & D EXTREMITY Right 08/20/2020   Procedure: IRRIGATION AND DEBRIDEMENT  EXTREMITY and closure of leg wound;  Surgeon: Renette Butters, MD;  Location: [facility];  Service: Orthopedics;  Laterality: Right;  IRRIGATION AND DEBRIDEMENT EXTREMITY and closure of leg wound   NO PAST SURGERIES      ROS: Review of Systems Negative except as stated above  PHYSICAL EXAM: BP 114/76    Pulse 72    Resp 16    Wt 161 lb 9.6 oz (73.3 kg)    SpO2 100%    BMI 34.97 kg/m   Wt Readings from Last 3 Encounters:  07/19/21 161 lb 9.6 oz (73.3 kg)  11/23/20 165 lb (74.8 kg)  09/23/20 159 lb (72.1 kg)    Physical Exam  General appearance - alert, well appearing, young Hispanic female and in no distress Mental status - normal mood, behavior, speech, dress, motor activity, and thought processes Neck - supple, no significant adenopathy Chest - clear to auscultation, no wheezes, rales or rhonchi, symmetric air entry Heart - normal rate, regular rhythm, normal S1, S2, no murmurs, rubs, clicks or gallops Extremities - peripheral pulses normal, no pedal edema, no clubbing or cyanosis Diabetic Foot Exam - Simple   Simple Foot Form Diabetic Foot exam was performed with the following findings: Yes 07/19/2021  5:09 PM  Visual Inspection See comments: Yes Sensation Testing Intact to touch and monofilament testing bilaterally: Yes Pulse Check Posterior Tibialis and Dorsalis pulse intact bilaterally: Yes Comments Toenails clipped a bit short on the big toes.      CMP Latest Ref Rng & Units 09/23/2020 08/29/2020 08/28/2020  Glucose 65 - 99 mg/dL 250(H) 197(H) 410(H)  BUN 6 - 20 mg/dL 13 9 13   Creatinine 0.57 - 1.00 mg/dL 0.55(L) 0.48 0.56  Sodium 134 - 144 mmol/L 137 138 132(L)  Potassium 3.5 - 5.2 mmol/L 4.0 3.5 3.7  Chloride 96 - 106 mmol/L 99 109 103  CO2 20 - 29 mmol/L 19(L) 20(L) 18(L)  Calcium 8.7 - 10.2 mg/dL 9.5 8.3(L) 8.7(L)  Total Protein 6.0 - 8.5 g/dL 7.5 - -  Total Bilirubin 0.0 - 1.2 mg/dL 1.2 - -  Alkaline Phos 44 - 121 IU/L 136(H) - -  AST 0 - 40 IU/L 17 - -  ALT 0  - 32 IU/L 31 - -   Lipid Panel  No results found for: CHOL, TRIG, HDL, CHOLHDL, VLDL, LDLCALC, LDLDIRECT  CBC    Component Value Date/Time   WBC 8.8 09/23/2020 1522   WBC 8.0 08/29/2020 0326   RBC 5.27 09/23/2020 1522   RBC 3.97 08/29/2020  0326   HGB 15.4 09/23/2020 1522   HCT 46.2 09/23/2020 1522   PLT 275 09/23/2020 1522   MCV 88 09/23/2020 1522   MCH 29.2 09/23/2020 1522   MCH 30.5 08/29/2020 0326   MCHC 33.3 09/23/2020 1522   MCHC 35.2 08/29/2020 0326   RDW 13.1 09/23/2020 1522   LYMPHSABS 1.9 09/23/2020 1522   MONOABS 0.4 02/22/2020 1421   EOSABS 0.2 09/23/2020 1522   BASOSABS 0.0 09/23/2020 1522    ASSESSMENT AND PLAN:  1. Type 2 diabetes mellitus with obesity (HCC) Not at goal. She has been out of metformin and glargine insulin for 1 month.  Refills sent to the pharmacy.  Now that she has access to a kitchen, I recommend healthy eating habits with meal preps. Check blood sugar twice a day before meals.  Record readings.  I will have her follow-up with clinical pharmacist in 1 month for recheck.  Advised to bring her readings with her. - POCT glucose (manual entry) - POCT glycosylated hemoglobin (Hb A1C) - Microalbumin / creatinine urine ratio - CBC; Future - Comprehensive metabolic panel; Future - Lipid panel; Future - metFORMIN (GLUCOPHAGE-XR) 500 MG 24 hr tablet; Take 4 tablets (2,000 mg total) by mouth daily.  Dispense: 120 tablet; Refill: 6 - Insulin Glargine (BASAGLAR KWIKPEN) 100 UNIT/ML; Inject 15 Units into the skin daily.  Dispense: 15 mL; Refill: 6  2. Vaginal itching Most likely vaginal yeast especially with diabetes being uncontrolled.  I recommend Diflucan pill as a one-time dose.  I have given Mycolog cream and told her to apply on the labia but not inside the vagina.  Use for several days until symptoms resolve. - fluconazole (DIFLUCAN) 150 MG tablet; Take 1 tablet (150 mg total) by mouth once for 1 dose.  Dispense: 1 tablet; Refill: 0 -  nystatin-triamcinolone ointment (MYCOLOG); Apply 1 application topically 2 (two) times daily.  Dispense: 30 g; Refill: 0  3. Need for vaccination against Streptococcus pneumoniae - Pneumococcal conjugate vaccine 20-valent  5. Need for immunization against influenza - Flu Vaccine QUAD 25moIM (Fluarix, Fluzone & Alfiuria Quad PF)   [employer] Language interpreter used during this encounter. ##[id number] Albino Patient was given the opportunity to ask questions.  Patient verbalized understanding of the plan and was able to repeat key elements of the plan.   Orders Placed This Encounter  Procedures   Flu Vaccine QUAD 640moM (Fluarix, Fluzone & Alfiuria Quad PF)   Pneumococcal conjugate vaccine 20-valent   Microalbumin / creatinine urine ratio   CBC   Comprehensive metabolic panel   Lipid panel   POCT glucose (manual entry)   POCT glycosylated hemoglobin (Hb A1C)     Requested Prescriptions   Signed Prescriptions Disp Refills   metFORMIN (GLUCOPHAGE-XR) 500 MG 24 hr tablet 120 tablet 6    Sig: Take 4 tablets (2,000 mg total) by mouth daily.   Insulin Glargine (BASAGLAR KWIKPEN) 100 UNIT/ML 15 mL 6    Sig: Inject 15 Units into the skin daily.   fluconazole (DIFLUCAN) 150 MG tablet 1 tablet 0    Sig: Take 1 tablet (150 mg total) by mouth once for 1 dose.   nystatin-triamcinolone ointment (MYCOLOG) 30 g 0    Sig: Apply 1 application topically 2 (two) times daily.    Return in about 4 months (around 11/16/2021) [facility]  DeKarle PlumberMD, FACP

## 2021-07-20 ENCOUNTER — Other Ambulatory Visit: Payer: Self-pay

## 2021-07-21 ENCOUNTER — Other Ambulatory Visit: Payer: Self-pay

## 2021-07-21 ENCOUNTER — Ambulatory Visit: Payer: Self-pay | Attending: Internal Medicine

## 2021-07-21 DIAGNOSIS — E1169 Type 2 diabetes mellitus with other specified complication: Secondary | ICD-10-CM

## 2021-07-22 LAB — CBC
Hematocrit: 42.7 % (ref 34.0–46.6)
Hemoglobin: 14.8 g/dL (ref 11.1–15.9)
MCH: 30.5 pg (ref 26.6–33.0)
MCHC: 34.7 g/dL (ref 31.5–35.7)
MCV: 88 fL (ref 79–97)
Platelets: 227 x10E3/uL (ref 150–450)
RBC: 4.86 x10E6/uL (ref 3.77–5.28)
RDW: 12.4 % (ref 11.7–15.4)
WBC: 9.1 x10E3/uL (ref 3.4–10.8)

## 2021-07-22 LAB — LIPID PANEL
Chol/HDL Ratio: 4.5 ratio — ABNORMAL HIGH (ref 0.0–4.4)
Cholesterol, Total: 172 mg/dL (ref 100–199)
HDL: 38 mg/dL — ABNORMAL LOW (ref >39)
LDL Chol Calc (NIH): 99 mg/dL (ref 0–99)
Triglycerides: 204 mg/dL — ABNORMAL HIGH (ref 0–149)
VLDL Cholesterol Cal: 35 mg/dL (ref 5–40)

## 2021-07-22 LAB — COMPREHENSIVE METABOLIC PANEL
ALT: 60 IU/L — ABNORMAL HIGH (ref 0–32)
AST: 22 IU/L (ref 0–40)
Albumin/Globulin Ratio: 1.8 (ref 1.2–2.2)
Albumin: 4.3 g/dL (ref 3.8–4.8)
Alkaline Phosphatase: 128 IU/L — ABNORMAL HIGH (ref 44–121)
BUN/Creatinine Ratio: 22 (ref 9–23)
BUN: 13 mg/dL (ref 6–20)
Bilirubin Total: 1.4 mg/dL — ABNORMAL HIGH (ref 0.0–1.2)
CO2: 23 mmol/L (ref 20–29)
Calcium: 9.1 mg/dL (ref 8.7–10.2)
Chloride: 99 mmol/L (ref 96–106)
Creatinine, Ser: 0.6 mg/dL (ref 0.57–1.00)
Globulin, Total: 2.4 g/dL (ref 1.5–4.5)
Glucose: 225 mg/dL — ABNORMAL HIGH (ref 70–99)
Potassium: 4.4 mmol/L (ref 3.5–5.2)
Sodium: 135 mmol/L (ref 134–144)
Total Protein: 6.7 g/dL (ref 6.0–8.5)
eGFR: 121 mL/min/{1.73_m2} (ref >59)

## 2021-07-24 ENCOUNTER — Other Ambulatory Visit: Payer: Self-pay | Admitting: Internal Medicine

## 2021-07-24 DIAGNOSIS — R7989 Other specified abnormal findings of blood chemistry: Secondary | ICD-10-CM

## 2021-07-24 NOTE — Progress Notes (Signed)
Let patient know that her cholesterol level is 99 with goal being less than 70.  Ideally, she should be on medication to help lower the cholesterol but I will hold off for now because her liver function tests are mildly elevated.  This is likely due to increased fat deposit in the liver from diabetes and being overweight.  Healthy eating habits and regular exercise will help to correct both issues.  Please return to the lab at her convenience to be screened for hepatitis C and hepatitis B both of which are viruses that can affect the liver.

## 2021-07-27 ENCOUNTER — Telehealth: Payer: Self-pay

## 2021-07-27 NOTE — Telephone Encounter (Signed)
Interpreter:Juan [id number]  Contacted pt to go over lab results pt didn't answer lvm   Sent a CRM and forward labs to NT to give pt labs when they call back

## 2021-08-17 ENCOUNTER — Ambulatory Visit: Payer: Self-pay | Admitting: Pharmacist

## 2021-11-16 ENCOUNTER — Ambulatory Visit: Payer: Self-pay | Admitting: *Deleted

## 2021-11-16 DIAGNOSIS — E669 Obesity, unspecified: Secondary | ICD-10-CM

## 2021-11-16 MED ORDER — TRUE METRIX BLOOD GLUCOSE TEST VI STRP
ORAL_STRIP | 2 refills | Status: DC
  Filled 2021-11-16: qty 100, 90d supply, fill #0
  Filled 2022-05-17: qty 100, 90d supply, fill #1

## 2021-11-16 MED ORDER — BASAGLAR KWIKPEN 100 UNIT/ML ~~LOC~~ SOPN
15.0000 [IU] | PEN_INJECTOR | Freq: Every day | SUBCUTANEOUS | 6 refills | Status: DC
  Filled 2021-11-16: qty 6, 40d supply, fill #0

## 2021-11-16 NOTE — Telephone Encounter (Signed)
?  Chief Complaint: ran out of insulin medication ?Symptoms: feeling weakness, blood glucose 3 days ago 395 . Has no strips to check blood glucose  ?Frequency: out of insulin 3 days  ?Pertinent Negatives: Patient denies blurred vision, headache, confusion,  ?Disposition: [x] ED /[] Urgent Care (no appt availability in office) / [] Appointment(In office/virtual)/ []  [facility]/ [] Home Care/ [] Refused Recommended Disposition /[]  Mobile Bus/ []  Follow-up with PCP ?Additional Notes:  ? ?Appt canceled today for tomorrow. Patient  requesting medications and test strips to check blood sugars.  ? ?Reason for Disposition ? [1] Prescription refill request for ESSENTIAL medicine (i.e., likelihood of harm to patient if not taken) AND [2] triager unable to refill per department policy ? ?Answer Assessment - Initial Assessment Questions ?1. DRUG NAME: "What medicine do you need to have refilled?" ?    Insulin glargine (basaglar kwikpen) and test strips  ?2. REFILLS REMAINING: "How many refills are remaining?" (Note: The label on the medicine or pill bottle will show how many refills are remaining. If there are no refills remaining, then a renewal may be needed.) ?    na ?3. EXPIRATION DATE: "What is the expiration date?" (Note: The label states when the prescription will expire, and thus can no longer be refilled.) ?    na ?4. PRESCRIBING HCP: "Who prescribed it?" Reason: If prescribed by specialist, call should be referred to that group. ?    PCP ?5. SYMPTOMS: "Do you have any symptoms?" ?    Ran out of insulin. Elevated blood sugars  ?6. PREGNANCY: "Is there any chance that you are pregnant?" "When was your last menstrual period?" ?    na ? ?Protocols used: Medication Refill and Renewal Call-A-AH ? ?

## 2021-11-16 NOTE — Telephone Encounter (Signed)
Triage encounter completed via interpreter Eugenia ID [phone] to report patient is out of medication insulin glargine (kwikpen) and test strips. Called pharmacy to notify assist with refills. Put on hold, unable to hold.  ?

## 2021-11-16 NOTE — Telephone Encounter (Signed)
Patient out of medication insulin and test strips. ?Requested Prescriptions  ?Pending Prescriptions Disp Refills  ?? glucose blood (TRUE METRIX BLOOD GLUCOSE TEST) test strip 100 each 2  ?  Sig: Use to check blood sugar once daily.  ?  ? Endocrinology: Diabetes - Testing Supplies Passed - 11/16/2021  5:16 PM  ?  ?  Passed - Valid encounter within last 12 months  ?  Recent Outpatient Visits   ?      ? 4 months ago Type 2 diabetes mellitus with obesity ([place])  ? [place] Ladell Pier, MD  ? 11 months ago Type 2 diabetes mellitus with obesity ([place])  ? [place] Ladell Pier, MD  ? 1 year ago Type 2 diabetes mellitus with other specified complication, without long-term current use of insulin ([place])  ? [place], RPH-CPP  ? 1 year ago Type 2 diabetes mellitus with other specified complication, without long-term current use of insulin ([place])  ? [place], RPH-CPP  ? 1 year ago Type 2 diabetes mellitus with other specified complication, without long-term current use of insulin ([place])  ? [place], RPH-CPP  ?  ?  ? ?  ?  ?  ?? Insulin Glargine (BASAGLAR KWIKPEN) 100 UNIT/ML 15 mL 6  ?  Sig: Inject 15 Units into the skin daily.  ?  ? Endocrinology:  Diabetes - Insulins Failed - 11/16/2021  5:16 PM  ?  ?  Failed - HBA1C is between 0 and 7.9 and within 180 days  ?  HbA1c, POC (controlled diabetic range)  ?Date Value Ref Range Status  ?07/19/2021 9.6 (A) 0.0 - 7.0 % Final  ?   ?  ?  Passed - Valid encounter within last 6 months  ?  Recent Outpatient Visits   ?      ? 4 months ago Type 2 diabetes mellitus with obesity ([place])  ? [place] Ladell Pier, MD  ? 11 months ago Type 2 diabetes mellitus with obesity ([place])  ? [place]  Ladell Pier, MD  ? 1 year ago Type 2 diabetes mellitus with other specified complication, without long-term current use of insulin ([place])  ? [place], RPH-CPP  ? 1 year ago Type 2 diabetes mellitus with other specified complication, without long-term current use of insulin ([place])  ? [place], RPH-CPP  ? 1 year ago Type 2 diabetes mellitus with other specified complication, without long-term current use of insulin ([place])  ? [place], RPH-CPP  ?  ?  ? ?  ?  ?  ? ?

## 2021-11-17 ENCOUNTER — Ambulatory Visit: Payer: Self-pay | Admitting: Internal Medicine

## 2021-11-17 ENCOUNTER — Other Ambulatory Visit: Payer: Self-pay

## 2021-12-07 ENCOUNTER — Ambulatory Visit: Payer: Self-pay | Attending: Physician Assistant | Admitting: Physician Assistant

## 2021-12-07 ENCOUNTER — Encounter: Payer: Self-pay | Admitting: Physician Assistant

## 2021-12-07 ENCOUNTER — Other Ambulatory Visit: Payer: Self-pay

## 2021-12-07 ENCOUNTER — Encounter: Payer: Self-pay | Admitting: Internal Medicine

## 2021-12-07 VITALS — BP 117/85 | HR 81 | Wt 161.2 lb

## 2021-12-07 DIAGNOSIS — M79642 Pain in left hand: Secondary | ICD-10-CM

## 2021-12-07 DIAGNOSIS — G629 Polyneuropathy, unspecified: Secondary | ICD-10-CM

## 2021-12-07 DIAGNOSIS — E1169 Type 2 diabetes mellitus with other specified complication: Secondary | ICD-10-CM

## 2021-12-07 DIAGNOSIS — E669 Obesity, unspecified: Secondary | ICD-10-CM

## 2021-12-07 DIAGNOSIS — R7989 Other specified abnormal findings of blood chemistry: Secondary | ICD-10-CM

## 2021-12-07 DIAGNOSIS — Z789 Other specified health status: Secondary | ICD-10-CM

## 2021-12-07 DIAGNOSIS — Z603 Acculturation difficulty: Secondary | ICD-10-CM

## 2021-12-07 DIAGNOSIS — M79641 Pain in right hand: Secondary | ICD-10-CM

## 2021-12-07 LAB — POCT GLYCOSYLATED HEMOGLOBIN (HGB A1C): HbA1c, POC (controlled diabetic range): 10.8 % — AB (ref 0.0–7.0)

## 2021-12-07 LAB — GLUCOSE, POCT (MANUAL RESULT ENTRY): POC Glucose: 308 mg/dl — AB (ref 70–99)

## 2021-12-07 MED ORDER — GABAPENTIN 100 MG PO CAPS
300.0000 mg | ORAL_CAPSULE | Freq: Every day | ORAL | 3 refills | Status: DC
  Filled 2021-12-07: qty 90, 30d supply, fill #0

## 2021-12-07 MED ORDER — METFORMIN HCL ER 500 MG PO TB24
2000.0000 mg | ORAL_TABLET | Freq: Every day | ORAL | 6 refills | Status: DC
  Filled 2021-12-07: qty 120, 30d supply, fill #0

## 2021-12-07 MED ORDER — INSULIN PEN NEEDLE 32G X 4 MM MISC
6 refills | Status: DC
  Filled 2021-12-07: qty 100, 100d supply, fill #0

## 2021-12-07 MED ORDER — BASAGLAR KWIKPEN 100 UNIT/ML ~~LOC~~ SOPN
20.0000 [IU] | PEN_INJECTOR | Freq: Every day | SUBCUTANEOUS | 6 refills | Status: DC
  Filled 2021-12-07: qty 15, 75d supply, fill #0

## 2021-12-07 NOTE — Patient Instructions (Signed)
Check blood sugar fasting and at bedtime and record and bring to next visit.   

## 2021-12-07 NOTE — Progress Notes (Signed)
Patient ID: Pamela Alvarado, female   DOB: 26-Oct-1985, 36 y.o.   MRN: [id number]   Pamela Alvarado, is a 36 y.o. female  [id number]  [id number]  DOB - 1985-09-02  Chief Complaint  Patient presents with   Diabetes   Leg Pain   Hand Pain       Subjective:   [name] is a 36 y.o. female here today for med RF.  She says she was only out of insulin for 4 days over the last 3 months but says her blood sugars were better on the days she didn't take her insulin.  She has been taking 15 units daily.  She has been out of tablets of metformin for about 3 weeks.  She does not follow diabetic diet.  She does not check blood sugars regularly.  No polyuria/polydipsia.  Denies HA/CP/SOB  She is also c/o B hand pain and at times weakness and dropping things.  No pain/problems at night.  NKI.  She is R hand dominant.    No problems updated.  ALLERGIES: No Known Allergies  PAST MEDICAL HISTORY: Past Medical History:  Diagnosis Date   Hypertension    Ovarian cyst 2004   in [place]    MEDICATIONS AT HOME: Prior to Admission medications   Medication Sig Start Date End Date Taking? Authorizing Provider  Blood Glucose Monitoring Suppl (TRUE METRIX METER) w/Device KIT Use to check blood sugar once daily. 10/11/20   Charlott Rakes, MD  gabapentin (NEURONTIN) 100 MG capsule Take 3 capsules (300 mg total) by mouth at bedtime. 12/07/21 12/07/22  Argentina Donovan, PA-C  glucose blood (TRUE METRIX BLOOD GLUCOSE TEST) test strip Use to check blood sugar once daily. 11/16/21   Ladell Pier, MD  ibuprofen (ADVIL) 600 MG tablet Take 1 tablet (600 mg total) by mouth every 6 (six) hours. 08/31/20   Geradine Girt, DO  Insulin Glargine (BASAGLAR KWIKPEN) 100 UNIT/ML Inject 20 Units into the skin daily. 12/07/21   Argentina Donovan, PA-C  Insulin Pen Needle 32G X 4 MM MISC use as directed 12/07/21   Argentina Donovan, PA-C  metFORMIN (GLUCOPHAGE-XR) 500 MG 24 hr tablet  Take 4 tablets (2,000 mg total) by mouth daily. 12/07/21   Argentina Donovan, PA-C  nystatin-triamcinolone ointment (MYCOLOG) Apply 1 application topically 2 (two) times daily. 07/19/21   Ladell Pier, MD  TRUEplus Lancets 28G MISC Use to check blood sugar once daily. 10/11/20   Newlin, Charlane Ferretti, MD    ROS: Neg HEENT Neg resp Neg cardiac Neg GI Neg GU Neg MS Neg psych Neg neuro  Objective:   Vitals:   12/07/21 1024  BP: 117/85  Pulse: 81  SpO2: 100%  Weight: 161 lb 3.2 oz (73.1 kg)   Exam General appearance : Awake, alert, not in any distress. Speech Clear. Not toxic looking HEENT: Atraumatic and Normocephalic Neck: Supple, no JVD. No cervical lymphadenopathy.  Chest: Good air entry bilaterally, CTAB.  No rales/rhonchi/wheezing CVS: S1 S2 regular, no murmurs.  B hands-full S&ROM and grip strength.  Neg tinel's and neg phalen's.   Extremities: B/L Lower Ext shows no edema, both legs are warm to touch Neurology: Awake alert, and oriented X 3, CN II-XII intact, Non focal Skin: No Rash  Data Review Lab Results  Component Value Date   HGBA1C 9.6 (A) 07/19/2021   HGBA1C 8.6 (A) 11/23/2020   HGBA1C 10.8 (H) 08/29/2020    Assessment & Plan   1. Type 2 diabetes  mellitus with obesity ([place]) A1C=10.8. Uncontrolled-will increase insulin to 20 units daily and have her resume metformin.  She is to check blood sugars fasting and bedtime and record and bring to next visit - Glucose (CBG) - POCT glycosylated hemoglobin (Hb A1C) - Lipid panel - Comprehensive metabolic panel - Insulin Glargine (BASAGLAR KWIKPEN) 100 UNIT/ML; Inject 20 Units into the skin daily.  Dispense: 15 mL; Refill: 6 - Insulin Pen Needle 32G X 4 MM MISC; use as directed  Dispense: 100 each; Refill: 6 - metFORMIN (GLUCOPHAGE-XR) 500 MG 24 hr tablet; Take 4 tablets (2,000 mg total) by mouth daily.  Dispense: 120 tablet; Refill: 6  2. Abnormal LFTs - Comprehensive metabolic panel  3. Language barrier [employer]  interpreters used(Jose) and additional time performing visit was required.   4. Neuropathy - gabapentin (NEURONTIN) 100 MG capsule; Take 3 capsules (300 mg total) by mouth at bedtime.  Dispense: 90 capsule; Refill: 3  5. Pain in both hands Does NOT seem to be CTS.  Gabapentin may help for now.   - Ambulatory referral to Orthopedic Surgery    Return in about 3 months (around 03/09/2022) for 3 weeks with Lurena Joiner for DM and 3 months with PCP for chronic conditions.  The patient was given clear instructions to go to ER or return to medical center if symptoms don't improve, worsen or new problems develop. The patient verbalized understanding. The patient was told to call to get lab results if they haven't heard anything in the next week.      Freeman Caldron, PA-C [facility] [place], [place]   12/07/2021, 10:42 AM

## 2021-12-08 LAB — COMPREHENSIVE METABOLIC PANEL
ALT: 60 IU/L — ABNORMAL HIGH (ref 0–32)
AST: 16 IU/L (ref 0–40)
Albumin/Globulin Ratio: 1.6 (ref 1.2–2.2)
Albumin: 4.3 g/dL (ref 3.8–4.8)
Alkaline Phosphatase: 143 IU/L — ABNORMAL HIGH (ref 44–121)
BUN/Creatinine Ratio: 29 — ABNORMAL HIGH (ref 9–23)
BUN: 14 mg/dL (ref 6–20)
Bilirubin Total: 0.8 mg/dL (ref 0.0–1.2)
CO2: 22 mmol/L (ref 20–29)
Calcium: 9.1 mg/dL (ref 8.7–10.2)
Chloride: 99 mmol/L (ref 96–106)
Creatinine, Ser: 0.48 mg/dL — ABNORMAL LOW (ref 0.57–1.00)
Globulin, Total: 2.7 g/dL (ref 1.5–4.5)
Glucose: 271 mg/dL — ABNORMAL HIGH (ref 70–99)
Potassium: 3.6 mmol/L (ref 3.5–5.2)
Sodium: 139 mmol/L (ref 134–144)
Total Protein: 7 g/dL (ref 6.0–8.5)
eGFR: 127 mL/min/{1.73_m2} (ref >59)

## 2021-12-08 LAB — LIPID PANEL
Chol/HDL Ratio: 5.1 ratio — ABNORMAL HIGH (ref 0.0–4.4)
Cholesterol, Total: 204 mg/dL — ABNORMAL HIGH (ref 100–199)
HDL: 40 mg/dL (ref >39)
LDL Chol Calc (NIH): 103 mg/dL — ABNORMAL HIGH (ref 0–99)
Triglycerides: 359 mg/dL — ABNORMAL HIGH (ref 0–149)
VLDL Cholesterol Cal: 61 mg/dL — ABNORMAL HIGH (ref 5–40)

## 2021-12-16 ENCOUNTER — Telehealth: Payer: Self-pay

## 2021-12-16 NOTE — Telephone Encounter (Signed)
Pt given lab results per notes of Dr. Laural Benes on 12/16/21. Pt verbalized understanding and had no further questions. Interpreter Byrd Hesselbach # [phone].

## 2022-01-12 ENCOUNTER — Ambulatory Visit: Payer: Self-pay | Admitting: Pharmacist

## 2022-02-01 ENCOUNTER — Emergency Department (HOSPITAL_COMMUNITY): Payer: Self-pay

## 2022-02-01 ENCOUNTER — Emergency Department (HOSPITAL_COMMUNITY)
Admission: EM | Admit: 2022-02-01 | Discharge: 2022-02-02 | Disposition: A | Discharge status: Home / Self Care | Payer: Self-pay | Attending: Emergency Medicine | Admitting: Emergency Medicine

## 2022-02-01 ENCOUNTER — Encounter (HOSPITAL_COMMUNITY): Payer: Self-pay

## 2022-02-01 ENCOUNTER — Other Ambulatory Visit: Payer: Self-pay

## 2022-02-01 DIAGNOSIS — Z7984 Long term (current) use of oral hypoglycemic drugs: Secondary | ICD-10-CM | POA: Insufficient documentation

## 2022-02-01 DIAGNOSIS — R300 Dysuria: Secondary | ICD-10-CM | POA: Insufficient documentation

## 2022-02-01 DIAGNOSIS — Z794 Long term (current) use of insulin: Secondary | ICD-10-CM | POA: Insufficient documentation

## 2022-02-01 DIAGNOSIS — R739 Hyperglycemia, unspecified: Secondary | ICD-10-CM

## 2022-02-01 DIAGNOSIS — E1165 Type 2 diabetes mellitus with hyperglycemia: Secondary | ICD-10-CM | POA: Insufficient documentation

## 2022-02-01 LAB — COMPREHENSIVE METABOLIC PANEL
ALT: 70 U/L — ABNORMAL HIGH (ref 0–44)
AST: 25 U/L (ref 15–41)
Albumin: 3.8 g/dL (ref 3.5–5.0)
Alkaline Phosphatase: 91 U/L (ref 38–126)
Anion gap: 10 (ref 5–15)
BUN: 13 mg/dL (ref 6–20)
CO2: 23 mmol/L (ref 22–32)
Calcium: 8.9 mg/dL (ref 8.9–10.3)
Chloride: 104 mmol/L (ref 98–111)
Creatinine, Ser: 0.52 mg/dL (ref 0.44–1.00)
GFR, Estimated: >60 mL/min (ref >60)
Glucose, Bld: 220 mg/dL — ABNORMAL HIGH (ref 70–99)
Potassium: 3.5 mmol/L (ref 3.5–5.1)
Sodium: 137 mmol/L (ref 135–145)
Total Bilirubin: 1.7 mg/dL — ABNORMAL HIGH (ref 0.3–1.2)
Total Protein: 6.9 g/dL (ref 6.5–8.1)

## 2022-02-01 LAB — CBC WITH DIFFERENTIAL/PLATELET
Abs Immature Granulocytes: 0.06 10*3/uL (ref 0.00–0.07)
Basophils Absolute: 0 10*3/uL (ref 0.0–0.1)
Basophils Relative: 0 %
Eosinophils Absolute: 0.2 10*3/uL (ref 0.0–0.5)
Eosinophils Relative: 2 %
HCT: 40.7 % (ref 36.0–46.0)
Hemoglobin: 14.9 g/dL (ref 12.0–15.0)
Immature Granulocytes: 1 %
Lymphocytes Relative: 29 %
Lymphs Abs: 2.8 10*3/uL (ref 0.7–4.0)
MCH: 30.2 pg (ref 26.0–34.0)
MCHC: 36.6 g/dL — ABNORMAL HIGH (ref 30.0–36.0)
MCV: 82.4 fL (ref 80.0–100.0)
Monocytes Absolute: 0.5 10*3/uL (ref 0.1–1.0)
Monocytes Relative: 6 %
Neutro Abs: 6.1 10*3/uL (ref 1.7–7.7)
Neutrophils Relative %: 62 %
Platelets: 255 10*3/uL (ref 150–400)
RBC: 4.94 MIL/uL (ref 3.87–5.11)
RDW: 11.8 % (ref 11.5–15.5)
WBC: 9.7 10*3/uL (ref 4.0–10.5)
nRBC: 0 % (ref 0.0–0.2)

## 2022-02-01 LAB — URINALYSIS, ROUTINE W REFLEX MICROSCOPIC
Bilirubin Urine: NEGATIVE
Glucose, UA: >=500 mg/dL — AB
Ketones, ur: NEGATIVE mg/dL
Leukocytes,Ua: NEGATIVE
Nitrite: POSITIVE — AB
Protein, ur: NEGATIVE mg/dL
Specific Gravity, Urine: 1.027 (ref 1.005–1.030)
pH: 5 (ref 5.0–8.0)

## 2022-02-01 LAB — I-STAT BETA HCG BLOOD, ED (MC, WL, AP ONLY): I-stat hCG, quantitative: <5 m[IU]/mL (ref <5)

## 2022-02-01 LAB — TROPONIN I (HIGH SENSITIVITY): Troponin I (High Sensitivity): 3 ng/L (ref <18)

## 2022-02-01 LAB — CBG MONITORING, ED: Glucose-Capillary: 200 mg/dL — ABNORMAL HIGH (ref 70–99)

## 2022-02-01 NOTE — ED Triage Notes (Signed)
Spanish interpreter used for triage.   Pt c/ elevated blood sugars, shob, migraine with pressure behind eyes, and inability to control crying.
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
## 2022-02-02 LAB — CBG MONITORING, ED: Glucose-Capillary: 196 mg/dL — ABNORMAL HIGH (ref 70–99)

## 2022-02-02 MED ORDER — CEPHALEXIN 500 MG PO CAPS: 500.0000 mg | ORAL_CAPSULE | Freq: Two times a day (BID) | ORAL | 0 refills | Status: AC

## 2022-02-02 MED ORDER — ALPRAZOLAM 0.25 MG PO TABS
0.5000 mg | ORAL_TABLET | Freq: Once | ORAL | Status: AC
Start: 2022-02-02 — End: 2022-02-02
  Administered 2022-02-02: 0.5 mg via ORAL
  Filled 2022-02-02: qty 2
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
## 2022-02-02 NOTE — Discharge Instructions (Addendum)
Le he recetado medicina para ayudar con su infeccion urinaria. Por favor, tome una [employer] al dia por los siguientes 7 dias. Asegurese de [employer].   Su le da fiebre, nausea, o vomito regrese a Radio broadcast assistant.

## 2022-02-08 LAB — SUSCEPTIBILITY RESULT

## 2022-02-10 LAB — SUSCEPTIBILITY, AER + ANAEROB

## 2022-02-10 LAB — MISC LABCORP TEST (SEND OUT): Labcorp test code: 182261

## 2022-02-18 LAB — URINE CULTURE: Culture: >=100000 — AB

## 2022-02-19 ENCOUNTER — Telehealth (HOSPITAL_BASED_OUTPATIENT_CLINIC_OR_DEPARTMENT_OTHER): Payer: Self-pay | Admitting: *Deleted

## 2022-02-19 NOTE — Telephone Encounter (Signed)
Post ED Visit - Positive Culture Follow-up: Successful Patient Follow-Up  Culture assessed and recommendations reviewed by:  []  , Pharm.D. []  Enzo Bi, Pharm.D., BCPS AQ-ID []  , Pharm.D., BCPS []  Celedonio Miyamoto, Pharm.D., BCPS []  [place], Garvin Fila.D., BCPS, AAHIVP []  , Pharm.D., BCPS, AAHIVP []  Georgina Pillion, PharmD, BCPS []  , PharmD, BCPS []  [place], PharmD, BCPS [x]  [address], PharmD  Positive urine culture  []  Patient discharged without antimicrobial prescription and treatment is now indicated [x]  Organism is resistant to prescribed ED discharge antimicrobial []  Patient with positive blood cultures  Changes discussed with ED provider: , PA-C New antibiotic prescription Omnicef 300mg  BID x 5 days Called to [facility] . [place],  Contacted patient, date 02/19/22, time 1213   02/19/2022, 12:14 PM

## 2022-02-19 NOTE — Progress Notes (Signed)
ED Antimicrobial Stewardship Positive Culture Follow Up   Pamela Alvarado is an 36 y.o. female who presented to [facility] on 02/01/2022 with a chief complaint of  Chief Complaint  Patient presents with   Hyperglycemia    Recent Results (from the past 720 hour(s))  Urine Culture     Status: Abnormal   Collection Time: 02/02/22  9:23 AM   Specimen: Urine, Clean Catch  Result Value Ref Range Status   Specimen Description URINE, CLEAN CATCH  Final   Special Requests NONE  Final   Culture (A)  Final    >=100,000 COLONIES/mL ESCHERICHIA COLI Sent to [facility] for further susceptibility testing. see scanned report. Performed at [facility], [address] [place], [place] [address]    Report Status 02/18/2022 FINAL  Final  Susceptibility, Aer + Anaerob     Status: Abnormal   Collection Time: 02/02/22  9:23 AM  Result Value Ref Range Status   Suscept, Aer + Anaerob DUPLICATE (A)  Corrected    Comment: (NOTE) Duplicate procedure ordered. Diego Cory notified 02-08-2022 Performed At: [facility] [address] [place], [place] [id number] Jolene Schimke MD VC:[id number] CORRECTED ON 08/09 AT 1036: PREVIOUSLY REPORTED AS Preliminary report    Source of Sample URINE  Final    Comment: Performed at [facility], [address], [place], [place] [address]  Susceptibility Result     Status: Abnormal   Collection Time: 02/02/22  9:23 AM  Result Value Ref Range Status   Suscept Result 1 Gram negative rods (A)  Final    Comment: (NOTE) Performed At: [facility] [address] [place], [place] [id number] Jolene Schimke MD [id number]     [x]  Treated with keflex, organism resistant to prescribed antimicrobial []  Patient discharged originally without antimicrobial agent and treatment is now indicated  New antibiotic prescription: [facility]  ED Provider: , PA-C   [facility] 02/19/2022, 10:58 AM Clinical Pharmacist Monday -  Friday phone -  [phone] Saturday - Sunday phone - [phone]

## 2022-02-22 ENCOUNTER — Other Ambulatory Visit: Payer: Self-pay

## 2022-03-14 ENCOUNTER — Other Ambulatory Visit: Payer: Self-pay

## 2022-03-14 ENCOUNTER — Telehealth: Payer: Self-pay | Admitting: Internal Medicine

## 2022-03-14 ENCOUNTER — Ambulatory Visit: Payer: Self-pay | Attending: Internal Medicine | Admitting: Internal Medicine

## 2022-03-14 ENCOUNTER — Encounter: Payer: Self-pay | Admitting: Internal Medicine

## 2022-03-14 VITALS — BP 123/74 | HR 85 | Temp 98.8°F | Ht <= 58 in | Wt 162.0 lb

## 2022-03-14 DIAGNOSIS — F320 Major depressive disorder, single episode, mild: Secondary | ICD-10-CM

## 2022-03-14 DIAGNOSIS — E1169 Type 2 diabetes mellitus with other specified complication: Secondary | ICD-10-CM

## 2022-03-14 DIAGNOSIS — F411 Generalized anxiety disorder: Secondary | ICD-10-CM

## 2022-03-14 DIAGNOSIS — E669 Obesity, unspecified: Secondary | ICD-10-CM

## 2022-03-14 DIAGNOSIS — Z5941 Food insecurity: Secondary | ICD-10-CM

## 2022-03-14 DIAGNOSIS — M792 Neuralgia and neuritis, unspecified: Secondary | ICD-10-CM

## 2022-03-14 LAB — GLUCOSE, POCT (MANUAL RESULT ENTRY): POC Glucose: 351 mg/dl — AB (ref 70–99)

## 2022-03-14 LAB — POCT GLYCOSYLATED HEMOGLOBIN (HGB A1C): HbA1c, POC (controlled diabetic range): 9.8 % — AB (ref 0.0–7.0)

## 2022-03-14 MED ORDER — BASAGLAR KWIKPEN 100 UNIT/ML ~~LOC~~ SOPN
22.0000 [IU] | PEN_INJECTOR | Freq: Every day | SUBCUTANEOUS | 6 refills | Status: DC
  Filled 2022-03-14: qty 6, 27d supply, fill #0
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
## 2022-03-15 ENCOUNTER — Other Ambulatory Visit: Payer: Self-pay

## 2022-03-22 ENCOUNTER — Other Ambulatory Visit: Payer: Self-pay

## 2022-03-27 NOTE — Telephone Encounter (Signed)
LCSWA called patient today with interpreter [phone] to introduce herself and to assess patients' mental health needs. Patient was referred by PCP for anxiety/depression, food insecurity and financial hardship. Patient was able to schedule an appointment for 04/12/22 with [facility]. Patient did not report any needs at this time.

## 2022-04-12 ENCOUNTER — Telehealth: Payer: Self-pay | Admitting: Licensed Clinical Social Worker

## 2022-04-12 ENCOUNTER — Other Ambulatory Visit: Payer: Self-pay

## 2022-04-12 ENCOUNTER — Ambulatory Visit: Payer: Self-pay | Attending: Licensed Clinical Social Worker | Admitting: Licensed Clinical Social Worker

## 2022-04-12 DIAGNOSIS — Z599 Problem related to housing and economic circumstances, unspecified: Secondary | ICD-10-CM

## 2022-04-12 DIAGNOSIS — F418 Other specified anxiety disorders: Secondary | ICD-10-CM

## 2022-04-12 DIAGNOSIS — E1169 Type 2 diabetes mellitus with other specified complication: Secondary | ICD-10-CM

## 2022-04-12 MED ORDER — METFORMIN HCL ER 500 MG PO TB24
2000.0000 mg | ORAL_TABLET | Freq: Every day | ORAL | 6 refills | Status: DC
  Filled 2022-04-12: qty 120, 30d supply, fill #0
  Filled 2022-05-17: qty 360, 90d supply, fill #0

## 2022-04-12 MED ORDER — SERTRALINE HCL 25 MG PO TABS
25.0000 mg | ORAL_TABLET | Freq: Every day | ORAL | 3 refills | Status: DC
  Filled 2022-04-12: qty 30, 30d supply, fill #0
  Filled 2022-05-17: qty 90, 90d supply, fill #0

## 2022-04-12 NOTE — Addendum Note (Signed)
Addended by: Karle Plumber B on: 04/12/2022 03:11 PM   Modules accepted: Orders

## 2022-04-12 NOTE — BH Specialist Note (Signed)
Integrated Behavioral Health Initial In-Person Visit  MRN: [id number] Name: Pamela Alvarado  Number of [facility] Clinician visits: 1- Initial Visit  Session Start time: 5361    Session End time: 4431  Total time in minutes: 59   Types of Service: Individual psychotherapy  Interpretor:Yes.   Interpretor Name and Language: Spanish   Warm Hand Off Completed.    Subjective: Pamela Alvarado is a 36 y.o. female accompanied by  herself and with the in-person interpreter. Patient was referred by PCP for anxiety/depression, food insecurity and financial hardship. Patient reports the following symptoms/concerns: anxiety/depression, needing support, feelings like things are going wrong because of her. Duration of problem: years; Severity of problem: moderate  Objective: Mood: Anxious and Depressed and Affect: Depressed and Tearful Risk of harm to self or others: No plan to harm self or others  Life Context: Family and Social: Patient lives with her husband, 3 kids and her niece that just moved here from [place]. School/Work: Patient does not work. Self-Care: none reported Life Changes: none reported  Patient and/or Family's Strengths/Protective Factors: Patient has the help of her niece during this time while she is stressed with house work.  Goals Addressed: Patient will: Reduce symptoms of: anxiety and depression Increase knowledge and/or ability of: coping skills  Demonstrate ability to: Increase motivation to adhere to plan of care  Progress towards Goals: Ongoing  Interventions: Interventions utilized: Mindfulness or Psychologist, educational and Psychoeducation and/or Health Education Shared examples and exercises with her on Mindfulness as well as education on the type of diabetes she has and how it affects her body.  Standardized Assessments completed: PHQ-SADS     04/12/2022   12:09 PM 03/14/2022    3:44 PM 03/14/2022    3:43 PM   PHQ-SADS Last 3 Score only  Total GAD-7 Score 13 12   PHQ Adolescent Score 10  9    Patient and/or Family Response: Patient reports that she feels that a lot of things are going wrong in her home because of her. The house is a mess, her niece is living with her and she is helping out with keeping the home tidy, but not she is now pregnant and she feels she has to support her. Patient repeatedly accused her husband of texting someone else he shouldn't be texting. She stated that he said he was not doing anything wrong, but it makes her very angry. Patient shared that due to her level of anxiety and sadness she is open to taking a medication.  Patient Centered Plan: Patient is on the following Treatment Plan(s):  Anxiety and Depression  Assessment: Patient currently experiencing only sleeping for 3-6 hours a day, overeating, always hungry, always feeling stressed out, crying a lot. Doesn't feel heard.   Patient may benefit from continued [facility] at [facility] for therapy to reduces symptoms of anxiety and depression.  Plan: Follow up with behavioral health clinician on : In 4 weeks Behavioral recommendations: Practice all exercises taught during session today using mindfulness. Noticing when she feels her body getting worked up and journal her thoughts. This will allow her to better communicate with her husband. Referral(s): [facility] (In Clinic), Community Resources:  Food, and pantry resources "From scale of 1-10, how likely are you to follow plan?": most likely  [employer], [place]

## 2022-04-12 NOTE — Telephone Encounter (Signed)
Hey Dr. Wynetta Emery I met with this patient today. She is interested in possibly receiving medication for her anxiety and depression symptoms that are affecting her daily. She also stated that she needs a refill on her metformin medication and glucose blood (TRUE METRIX BLOOD GLUCOSE TEST) test strip. She can be reached at [phone]. Thank you

## 2022-04-19 ENCOUNTER — Other Ambulatory Visit: Payer: Self-pay

## 2022-05-17 ENCOUNTER — Other Ambulatory Visit: Payer: Self-pay

## 2022-05-17 ENCOUNTER — Ambulatory Visit: Payer: Self-pay | Attending: Licensed Clinical Social Worker | Admitting: Licensed Clinical Social Worker

## 2022-05-17 DIAGNOSIS — F418 Other specified anxiety disorders: Secondary | ICD-10-CM

## 2022-05-17 DIAGNOSIS — F419 Anxiety disorder, unspecified: Secondary | ICD-10-CM

## 2022-05-17 DIAGNOSIS — F32A Depression, unspecified: Secondary | ICD-10-CM

## 2022-06-19 ENCOUNTER — Ambulatory Visit: Payer: Self-pay | Admitting: Licensed Clinical Social Worker

## 2022-09-06 ENCOUNTER — Encounter: Payer: Self-pay | Admitting: Physician Assistant

## 2022-09-06 ENCOUNTER — Ambulatory Visit: Payer: Self-pay

## 2022-09-06 ENCOUNTER — Other Ambulatory Visit: Payer: Self-pay

## 2022-09-06 ENCOUNTER — Ambulatory Visit: Payer: Self-pay | Attending: Physician Assistant | Admitting: Physician Assistant

## 2022-09-06 VITALS — BP 110/78 | HR 82 | Wt 151.6 lb

## 2022-09-06 DIAGNOSIS — R112 Nausea with vomiting, unspecified: Secondary | ICD-10-CM

## 2022-09-06 DIAGNOSIS — B379 Candidiasis, unspecified: Secondary | ICD-10-CM

## 2022-09-06 DIAGNOSIS — E1165 Type 2 diabetes mellitus with hyperglycemia: Secondary | ICD-10-CM

## 2022-09-06 DIAGNOSIS — F418 Other specified anxiety disorders: Secondary | ICD-10-CM

## 2022-09-06 DIAGNOSIS — R197 Diarrhea, unspecified: Secondary | ICD-10-CM

## 2022-09-06 DIAGNOSIS — Z789 Other specified health status: Secondary | ICD-10-CM

## 2022-09-06 LAB — POCT GLYCOSYLATED HEMOGLOBIN (HGB A1C): HbA1c, POC (controlled diabetic range): 10.1 % — AB (ref 0.0–7.0)

## 2022-09-06 LAB — GLUCOSE, POCT (MANUAL RESULT ENTRY): POC Glucose: 232 mg/dl — AB (ref 70–99)

## 2022-09-06 MED ORDER — PROMETHAZINE HCL 25 MG PO TABS
25.0000 mg | ORAL_TABLET | Freq: Three times a day (TID) | ORAL | 0 refills | Status: DC | PRN
  Filled 2022-09-06: qty 20, 7d supply, fill #0

## 2022-09-06 MED ORDER — SERTRALINE HCL 25 MG PO TABS
25.0000 mg | ORAL_TABLET | Freq: Every day | ORAL | 3 refills | Status: DC
  Filled 2022-09-06: qty 30, 30d supply, fill #0

## 2022-09-06 MED ORDER — METFORMIN HCL ER 500 MG PO TB24
2000.0000 mg | ORAL_TABLET | Freq: Every day | ORAL | 6 refills | Status: DC
  Filled 2022-09-06: qty 120, 30d supply, fill #0

## 2022-09-06 MED ORDER — FLUCONAZOLE 150 MG PO TABS
150.0000 mg | ORAL_TABLET | Freq: Once | ORAL | 0 refills | Status: AC
  Filled 2022-09-06: qty 1, 1d supply, fill #0

## 2022-09-06 MED ORDER — BASAGLAR KWIKPEN 100 UNIT/ML ~~LOC~~ SOPN
22.0000 [IU] | PEN_INJECTOR | Freq: Every day | SUBCUTANEOUS | 6 refills | Status: DC
  Filled 2022-09-06: qty 6, 27d supply, fill #0

## 2022-09-06 MED ORDER — LOPERAMIDE HCL 2 MG PO TABS
2.0000 mg | ORAL_TABLET | Freq: Four times a day (QID) | ORAL | 0 refills | Status: DC | PRN
  Filled 2022-09-06: qty 30, 8d supply, fill #0

## 2022-09-06 NOTE — Patient Instructions (Addendum)
Check blood sugars fasting and bedtime.  Resume your basaglar.      Gastroenteritis viral en los adultos Viral Gastroenteritis, Adult  La gastroenteritis viral tambin se conoce como gripe estomacal. Esta afeccin podra afectarle el estmago, el intestino delgado y el intestino grueso. Puede causar heces lquidas (diarrea), fiebre y vmitos repentinos. Esta afeccin es el resultado de determinados microbios (virus). Estos microbios pueden transmitirse de Ardelia Mems persona a otra con mucha facilidad (son contagiosos). La diarrea y los vmitos pueden hacer que se sienta dbil y que no tenga una cantidad suficiente de agua en el cuerpo (se deshidrate). Esto puede hacerlo sentir cansado y sediento, producirle sequedad en la boca y disminuir la frecuencia con la que [place]. Es importante restituir los lquidos que se pierden a causa de la diarrea y los vmitos. Cules son las causas? Usted puede enfermarse al contagiarse microbios de Producer, television/film/video. Tambin puede enfermarse de las siguientes maneras: Al comer alimentos, beber agua o tocar una superficie que tengan los microbios (estn contaminados). Al compartir utensilios u otros artculos personales con una persona enferma. Qu incrementa el riesgo? Tener debilitado el sistema de defensa del cuerpo (sistema inmunitario). Vivir con uno o ms nios menores de 2 aos. Vivir en un hogar de ancianos. Viajar en cruceros. Cules son los signos o sntomas? Los sntomas de esta afeccin aparecen repentinamente. Pueden durar [employer] o incluso [place]. Los sntomas frecuentes incluyen los siguientes: Heces acuosas. Vmitos. Otros sntomas pueden incluir los siguientes: Systems analyst. Dolor de [place]. Sensacin de cansancio (fatiga). Dolor de vientre (abdomen). Escalofros. Sensacin de debilidad. Sensacin de que va a vomitar (nuseas). Dolores musculares. Falta de apetito. Cmo se trata? Por lo general, esta afeccin desaparece por s sola. El  objetivo del tratamiento es reponer los lquidos que se pierden. El tratamiento de esta afeccin puede incluir: Ardelia Mems SRO (solucin de rehidratacin oral). Esta es una bebida que ayuda a [employer] lquidos y los minerales que el cuerpo perdi. Se vende en farmacias y tiendas. Medicamentos para [employer]. Suplementos probiticos para reducir los sntomas de heces acuosas. Recibir lquidos por un tubo (catter) intravenoso si es necesario. Los [employer] y las personas que tienen otras enfermedades o que tienen debilitado el sistema de defensa del cuerpo corren un mayor riesgo al no tener una cantidad suficiente de agua del cuerpo. Siga estas indicaciones en su casa: Comida y bebida  Tome una SRO como se lo haya indicado el mdico. En la medida en que pueda, beba lquidos transparentes en pequeas cantidades. Los lquidos transparentes son, por ejemplo: Grayce Sessions. Trocitos de hielo. Jugo de frutas con agua agregada (diluido). Bebidas deportivas de bajas caloras. Beba suficiente lquido para Contractor pis (la orina) de color amarillo plido. Coma pequeas cantidades de alimentos saludables cada 3 a 4 horas segn su tolerancia. Estos pueden incluir cereales integrales, frutas, verduras, carnes magras y yogur. Evite los lquidos con alto contenido de Location manager o cafena. Esto incluye bebidas energticas, bebidas deportivas y gaseosas. Evite los alimentos condimentados o con alto contenido de [place]. Evite tomar alcohol. Indicaciones generales  Lvese las manos con frecuencia. Esto es muy importante despus de hacer heces acuosas o vomitar. Use un desinfectante para manos si no dispone de [place] y [place]. Asegrese de que todas las personas que viven en su casa se laven bien las manos y con frecuencia. Use los medicamentos de venta libre y los recetados solamente como se lo haya indicado el mdico. Descanse en su casa hasta sentirse mejor. Controle su afeccin  para detectar cualquier  cambio. Tome un bao caliente para ayudar a Transport planner ardor o el dolor que produce la diarrea. Concurra a [place]. Comunquese con un mdico si: No puede retener los lquidos. Sus sntomas empeoran. Aparecen nuevos sntomas. Se siente aturdido o mareado. Tiene calambres musculares. Solicite ayuda de inmediato si: Electronics engineer. Tiene problemas para respirar o respira muy rpido. Tiene latidos cardacos acelerados. Se siente muy dbil o se desmaya. Siente un dolor de cabeza intenso, rigidez en el cuello, o ambas cosas. Tiene una erupcin cutnea. Tiene dolor muy intenso en el vientre, clicos o meteorismo. Siente la piel fra y hmeda. Se siente desorientado (confundido). Siente dolor al [employer]. Presenta signos de no tener suficiente agua en el cuerpo, por ejemplo: La orina es [place], es muy escasa o no [place]. Labios agrietados. Sequedad de boca. Ojos hundidos. Se siente muy somnoliento. Sensacin de debilidad. Presenta signos de sangrado, como los siguientes: Observa sangre en el vmito. El vmito se asemeja al poso del caf. Hace materia fecal con sangre, de color negro o que parece alquitrn. Estos sntomas pueden Sales executive. Solicite ayuda de inmediato. Llame al 911. No espere a ver si los sntomas desaparecen. No conduzca por sus propios medios [employer]. Resumen La gastroenteritis viral tambin se conoce como gripe estomacal. Esta afeccin puede provocar heces lquidas (diarrea), fiebre y vmitos de forma repentina. Estos microbios pueden transmitirse de [place] persona a otra con mucha facilidad. Beba una SRO (solucin de rehidratacin oral), como se lo haya indicado el mdico. Es una bebida que se vende en farmacias y tiendas. Lvese las manos con frecuencia, especialmente despus de tener heces lquidas o vmitos. Use un desinfectante para manos si no dispone de [place] y [place]. Esta informacin no tiene Hydrologist el consejo del mdico. Asegrese de hacerle al mdico cualquier pregunta que tenga. Document Revised: 05/12/2021 Document Reviewed: 05/12/2021 Elsevier Patient Education  [place].

## 2022-09-06 NOTE — Telephone Encounter (Signed)
   Chief Complaint: Abdominal pain all across. Symptoms: Pain, gas Frequency: 3 days ago Pertinent Negatives: Patient denies nausea Disposition: '[]'$ ED /'[]'$ Urgent Care (no appt availability in office) / '[x]'$ Appointment(In office/virtual)/ '[]'$  [facility]/ '[]'$ Home Care/ '[]'$ Refused Recommended Disposition /'[]'$ [facility]/ '[]'$  Follow-up with PCP Additional Notes:    Reason for Disposition  [1] MILD-MODERATE pain AND [2] constant AND [3] present > 2 hours  Answer Assessment - Initial Assessment Questions 1. LOCATION: "Where does it hurt?"      Upper to lower 2. RADIATION: "Does the pain shoot anywhere else?" (e.g., chest, back)     Back 3. ONSET: "When did the pain begin?" (e.g., minutes, hours or days ago)      3 days 4. SUDDEN: "Gradual or sudden onset?"     Gradual 5. PATTERN "Does the pain come and go, or is it constant?"    - If it comes and goes: "How long does it last?" "Do you have pain now?"     (Note: Comes and goes means the pain is intermittent. It goes away completely between bouts.)    - If constant: "Is it getting better, staying the same, or getting worse?"      (Note: Constant means the pain never goes away completely; most serious pain is constant and gets worse.)      Comes and goes 6. SEVERITY: "How bad is the pain?"  (e.g., Scale 1-10; mild, moderate, or severe)    - MILD (1-3): Doesn't interfere with normal activities, abdomen soft and not tender to touch.     - MODERATE (4-7): Interferes with normal activities or awakens from sleep, abdomen tender to touch.     - SEVERE (8-10): Excruciating pain, doubled over, unable to do any normal activities.       Now - 8 7. RECURRENT SYMPTOM: "Have you ever had this type of stomach pain before?" If Yes, ask: "When was the last time?" and "What happened that time?"      No 8. CAUSE: "What do you think is causing the stomach pain?"     No 9. RELIEVING/AGGRAVATING FACTORS: "What makes it better or worse?" (e.g.,  antacids, bending or twisting motion, bowel movement)     No 10. OTHER SYMPTOMS: "Do you have any other symptoms?" (e.g., back pain, diarrhea, fever, urination pain, vomiting)       Gas 11. PREGNANCY: "Is there any chance you are pregnant?" "When was your last menstrual period?"       No  Protocols used: Abdominal Pain - [facility]

## 2022-09-06 NOTE — Progress Notes (Signed)
Patient ID: Pamela Alvarado, female   DOB: 07/11/1985, 37 y.o.   MRN: [id number]    Pamela Alvarado, is a 37 y.o. female  [id number]  [id number]  DOB - Nov 03, 1985  Chief Complaint  Patient presents with   Diabetes   Emesis   Diarrhea       Subjective:   Pamela Alvarado is a 37 y.o. female here today for med RF.  She has not not been taking her insulin at all.  She is compliant with metformin. '  She is c/o vaginal itching  She has had nausea, vomiting and diarrhea for 2 days.  No abdominal pain.  Her son is also sick with similar.  No fever. No recent travel.  2-3 episodes of vomiting but none today.  11 episodes of diarrhea on first day but only 4 today.  She is already feeling a little better.    No problems updated.  ALLERGIES: No Known Allergies  PAST MEDICAL HISTORY: Past Medical History:  Diagnosis Date   Hypertension    Ovarian cyst 2004   in [place]    MEDICATIONS AT HOME: Prior to Admission medications   Medication Sig Start Date End Date Taking? Authorizing Provider  Blood Glucose Monitoring Suppl (TRUE METRIX METER) w/Device KIT Use to check blood sugar once daily. 10/11/20  Yes Charlott Rakes, MD  fluconazole (DIFLUCAN) 150 MG tablet Take 1 tablet (150 mg total) by mouth once for 1 dose. 09/06/22 09/06/22 Yes Kileigh Ortmann, Dionne Bucy, PA-C  gabapentin (NEURONTIN) 100 MG capsule Take 3 capsules (300 mg total) by mouth at bedtime. 12/07/21 12/07/22 Yes Janiqua Friscia, Dionne Bucy, PA-C  glucose blood (TRUE METRIX BLOOD GLUCOSE TEST) test strip Use to check blood sugar once daily. 11/16/21  Yes Ladell Pier, MD  ibuprofen (ADVIL) 600 MG tablet Take 1 tablet (600 mg total) by mouth every 6 (six) hours. 08/31/20  Yes Geradine Girt, DO  Insulin Pen Needle 32G X 4 MM MISC use as directed 12/07/21  Yes Fares Ramthun, Dionne Bucy, PA-C  loperamide (IMODIUM A-D) 2 MG tablet Take 1 tablet (2 mg total) by mouth 4 (four) times daily as needed for diarrhea or loose  stools. 09/06/22  Yes Argentina Donovan, PA-C  nystatin-triamcinolone ointment (MYCOLOG) Apply 1 application topically 2 (two) times daily. 07/19/21  Yes Ladell Pier, MD  promethazine (PHENERGAN) 25 MG tablet Take 1 tablet (25 mg total) by mouth every 8 (eight) hours as needed for nausea or vomiting. 09/06/22  Yes Argentina Donovan, PA-C  TRUEplus Lancets 28G MISC Use to check blood sugar once daily. 10/11/20  Yes Charlott Rakes, MD  Insulin Glargine (BASAGLAR KWIKPEN) 100 UNIT/ML Inject 22 Units into the skin daily. 09/06/22   Argentina Donovan, PA-C  metFORMIN (GLUCOPHAGE-XR) 500 MG 24 hr tablet Take 4 tablets (2,000 mg total) by mouth daily. 09/06/22   Argentina Donovan, PA-C  sertraline (ZOLOFT) 25 MG tablet Take 1 tablet (25 mg total) by mouth daily. 09/06/22   Shelvy Perazzo, Dionne Bucy, PA-C    ROS: Neg HEENT Neg resp Neg cardiac Neg GU Neg MS Neg psych Neg neuro  Objective:   Vitals:   09/06/22 1515  BP: 110/78  Pulse: 82  SpO2: 99%  Weight: 151 lb 9.6 oz (68.8 kg)   Exam General appearance : Awake, alert, not in any distress. Speech Clear. Not toxic looking HEENT: Atraumatic and Normocephalic Neck: Supple, no JVD. No cervical lymphadenopathy.  Chest: Good air entry bilaterally, CTAB.  No rales/rhonchi/wheezing CVS:  S1 S2 regular, no murmurs.  Abdomen: Bowel sounds present, Non tender and not distended with no gaurding, rigidity or rebound. Extremities: B/L Lower Ext shows no edema, both legs are warm to touch Neurology: Awake alert, and oriented X 3, CN II-XII intact, Non focal Skin: No Rash  Data Review Lab Results  Component Value Date   HGBA1C 9.8 (A) 03/14/2022   HGBA1C 10.8 (A) 12/07/2021   HGBA1C 9.6 (A) 07/19/2021    Assessment & Plan   1. Type 2 diabetes mellitus with hyperglycemia, unspecified whether long term insulin use (HCC) Uncontrolled.  Push fluids.  Resume insulin.  Go to ED if she can not keep liquids in or if unable to get blood sugars down.   -  Glucose (CBG) - HgB A1c - Insulin Glargine (BASAGLAR KWIKPEN) 100 UNIT/ML; Inject 22 Units into the skin daily.  Dispense: 15 mL; Refill: 6 - metFORMIN (GLUCOPHAGE-XR) 500 MG 24 hr tablet; Take 4 tablets (2,000 mg total) by mouth daily.  Dispense: 120 tablet; Refill: 6 - Comprehensive metabolic panel - CBC with Differential/Platelet - Lipid panel  2. Language barrier [employer] "[place]" interpreters used and additional time performing visit was required.   3. Nausea and vomiting, unspecified vomiting type Likely Norovirus which is going around - promethazine (PHENERGAN) 25 MG tablet; Take 1 tablet (25 mg total) by mouth every 8 (eight) hours as needed for nausea or vomiting.  Dispense: 20 tablet; Refill: 0  4. Diarrhea, unspecified type To ED if it does not continue to improve - loperamide (IMODIUM A-D) 2 MG tablet; Take 1 tablet (2 mg total) by mouth 4 (four) times daily as needed for diarrhea or loose stools.  Dispense: 30 tablet; Refill: 0  5. Anxiety associated with depression stable - sertraline (ZOLOFT) 25 MG tablet; Take 1 tablet (25 mg total) by mouth daily.  Dispense: 30 tablet; Refill: 3  6. Yeast infection Likely secondary to #1 - fluconazole (DIFLUCAN) 150 MG tablet; Take 1 tablet (150 mg total) by mouth once for 1 dose.  Dispense: 1 tablet; Refill: 0    Return in about 1 month (around 10/07/2022) for [facility] in 1 month;  3 months with PCP.  The patient was given clear instructions to go to ER or return to medical center if symptoms don't improve, worsen or new problems develop. The patient verbalized understanding. The patient was told to call to get lab results if they haven't heard anything in the next week.      Freeman Caldron, PA-C [facility] [place], [place]   09/06/2022, 3:40 PM

## 2022-09-07 LAB — COMPREHENSIVE METABOLIC PANEL
ALT: 29 IU/L (ref 0–32)
AST: 11 IU/L (ref 0–40)
Albumin/Globulin Ratio: 1.7 (ref 1.2–2.2)
Albumin: 4.4 g/dL (ref 3.9–4.9)
Alkaline Phosphatase: 149 IU/L — ABNORMAL HIGH (ref 44–121)
BUN/Creatinine Ratio: 28 — ABNORMAL HIGH (ref 9–23)
BUN: 17 mg/dL (ref 6–20)
Bilirubin Total: 0.8 mg/dL (ref 0.0–1.2)
CO2: 20 mmol/L (ref 20–29)
Calcium: 9.1 mg/dL (ref 8.7–10.2)
Chloride: 102 mmol/L (ref 96–106)
Creatinine, Ser: 0.6 mg/dL (ref 0.57–1.00)
Globulin, Total: 2.6 g/dL (ref 1.5–4.5)
Glucose: 240 mg/dL — ABNORMAL HIGH (ref 70–99)
Potassium: 4.3 mmol/L (ref 3.5–5.2)
Sodium: 136 mmol/L (ref 134–144)
Total Protein: 7 g/dL (ref 6.0–8.5)
eGFR: 120 mL/min/{1.73_m2} (ref >59)

## 2022-09-07 LAB — CBC WITH DIFFERENTIAL/PLATELET
Basophils Absolute: 0 10*3/uL (ref 0.0–0.2)
Basos: 0 %
EOS (ABSOLUTE): 0.2 10*3/uL (ref 0.0–0.4)
Eos: 2 %
Hematocrit: 45.1 % (ref 34.0–46.6)
Hemoglobin: 15.4 g/dL (ref 11.1–15.9)
Immature Grans (Abs): 0 10*3/uL (ref 0.0–0.1)
Immature Granulocytes: 0 %
Lymphocytes Absolute: 2 10*3/uL (ref 0.7–3.1)
Lymphs: 21 %
MCH: 29.6 pg (ref 26.6–33.0)
MCHC: 34.1 g/dL (ref 31.5–35.7)
MCV: 87 fL (ref 79–97)
Monocytes Absolute: 0.5 10*3/uL (ref 0.1–0.9)
Monocytes: 5 %
Neutrophils Absolute: 6.9 10*3/uL (ref 1.4–7.0)
Neutrophils: 72 %
Platelets: 294 10*3/uL (ref 150–450)
RBC: 5.2 x10E6/uL (ref 3.77–5.28)
RDW: 12.4 % (ref 11.7–15.4)
WBC: 9.6 10*3/uL (ref 3.4–10.8)

## 2022-09-07 LAB — LIPID PANEL
Chol/HDL Ratio: 4.8 ratio — ABNORMAL HIGH (ref 0.0–4.4)
Cholesterol, Total: 182 mg/dL (ref 100–199)
HDL: 38 mg/dL — ABNORMAL LOW (ref >39)
LDL Chol Calc (NIH): 92 mg/dL (ref 0–99)
Triglycerides: 315 mg/dL — ABNORMAL HIGH (ref 0–149)
VLDL Cholesterol Cal: 52 mg/dL — ABNORMAL HIGH (ref 5–40)

## 2022-09-12 NOTE — BH Specialist Note (Signed)
Integrated Behavioral Health Follow Up In-Person Visit  MRN: [id number] Name: Pamela Alvarado [facility]  Number of [place] Clinician visits: 2- Second Visit  Session Start time: 1107   Session End time: 1202  Total time in minutes: 55   Types of Service: Individual psychotherapy  Interpretor:Yes.   Interpretor Name and Language: Spanish  Subjective: Pamela Alvarado is a 37 y.o. female accompanied by  herself and with the in-person interpreter. Patient was referred by PCP for anxiety/depression, food insecurity and financial hardship. Patient reports the following symptoms/concerns: anxiety/depression, needing support, feelings like things are going wrong because of her. Duration of problem: years; Severity of problem: moderate   Objective: Mood: Anxious and Depressed and Affect: Depressed and Tearful Risk of harm to self or others: No plan to harm self or others   Life Context: Family and Social: Patient lives with her husband, 3 kids and her niece that just moved here from [place]. School/Work: Patient does not work. Self-Care: none reported Life Changes: none reported   Patient and/or Family's Strengths/Protective Factors: Patient has the help of her niece during this time while she is stressed with house work.  Goals Addressed: Patient will:  Reduce symptoms of: agitation and depression   Increase knowledge and/or ability of: coping skills   Demonstrate ability to: Increase motivation to adhere to plan of care  Progress towards Goals: Ongoing  Interventions: Interventions utilized: Mindfulness or Psychologist, educational and Psychoeducation and/or Health Education Shared examples and exercises with her on Mindfulness as well as education on the type of diabetes she has and how it affects her body.   Patient and/or Family Response: Patient reports that she feels that her and her husband have been a better terms but still feels that he is up  to something. She shared some stories. Client didn't mention niece and her pregnancy. Client stated that she is hoping to start taking medication and focusing on her body language.   Patient Centered Plan: Patient is on the following Treatment Plan(s): Anxiety and Depression Assessment: Patient currently experiencing only sleeping a few hours a day, overeating, always hungry, feeling stressed out, crying a lot. Still doesn't feel heard.   Patient may benefit from continued support at [facility] for therapy to reduces symptoms of anxiety and depression. .  Plan: Follow up with behavioral health clinician on : in 4 weeks Behavioral recommendations: mindfulness. journal her thoughts. communicate with her husband to address her needs Referral(s): [place] (In Clinic) "From scale of 1-10, how likely are you to follow plan?": most likely  Shann Medal, [place]

## 2022-09-13 ENCOUNTER — Other Ambulatory Visit: Payer: Self-pay | Admitting: *Deleted

## 2022-09-13 NOTE — Telephone Encounter (Signed)
Patient requesting glucose test strips. Type of strip and pharmacy verified- will send request.

## 2022-09-14 ENCOUNTER — Other Ambulatory Visit: Payer: Self-pay

## 2022-09-14 ENCOUNTER — Other Ambulatory Visit: Payer: Self-pay | Admitting: Family Medicine

## 2022-09-14 DIAGNOSIS — E1165 Type 2 diabetes mellitus with hyperglycemia: Secondary | ICD-10-CM

## 2022-09-14 MED ORDER — TRUE METRIX BLOOD GLUCOSE TEST VI STRP
ORAL_STRIP | 2 refills | Status: DC
  Filled 2022-09-14: qty 100, 100d supply, fill #0

## 2022-09-14 MED ORDER — TRUEPLUS LANCETS 28G MISC
1 refills | Status: DC
  Filled 2022-09-14: qty 100, 90d supply, fill #0

## 2022-09-14 NOTE — Telephone Encounter (Signed)
Requested Prescriptions  Pending Prescriptions Disp Refills   glucose blood (TRUE METRIX BLOOD GLUCOSE TEST) test strip 100 each 2    Sig: Use to check blood sugar once daily.     Endocrinology: Diabetes - Testing Supplies Passed - 09/13/2022  2:47 PM      Passed - Valid encounter within last 12 months    Recent Outpatient Visits           1 week ago Type 2 diabetes mellitus with hyperglycemia, unspecified whether long term insulin use [facility])   [place], [place], [place]   6 months ago Type 2 diabetes mellitus with obesity [facility])   [employer] Karle Plumber B, MD   9 months ago Type 2 diabetes mellitus with obesity [facility])   [place], [place], [place]   1 year ago Type 2 diabetes mellitus with obesity [facility])   [place] Karle Plumber B, MD   1 year ago Type 2 diabetes mellitus with obesity [facility])   [place] Ladell Pier, MD       Future Appointments             In 3 weeks Daisy Blossom, Jarome Matin, [place]   In 2 months Wynetta Emery, Dalbert Batman, MD [place]

## 2022-09-20 ENCOUNTER — Other Ambulatory Visit: Payer: Self-pay

## 2022-10-10 ENCOUNTER — Ambulatory Visit: Payer: Self-pay | Admitting: Pharmacist

## 2022-12-08 ENCOUNTER — Ambulatory Visit: Payer: Self-pay | Admitting: Internal Medicine

## 2023-01-12 ENCOUNTER — Ambulatory Visit: Payer: Self-pay | Admitting: Internal Medicine

## 2023-11-06 NOTE — Progress Notes (Signed)
 Dental procedures in this visit  . I9809 - SCREENING OF A PATIENT (Completed)    Service provider: Vernell Salt, DDS    Billing provider: Vernell Salt, DDS  . I8793 - TOPICAL APPLICATION OF FLUORIDE VARNISH (Completed)    Service provider: Vernell Salt, DDS    Billing provider: Vernell Salt, DDS   Subjective Patient ID: Pamela Alvarado is a 38 y.o. female. Patient was seen at a community outreach event.   Fluoride varnish applied; verbal instructions given.

## 2024-01-14 ENCOUNTER — Ambulatory Visit: Payer: Self-pay

## 2024-01-14 ENCOUNTER — Encounter (HOSPITAL_COMMUNITY): Payer: Self-pay | Admitting: Family Medicine

## 2024-01-14 ENCOUNTER — Other Ambulatory Visit: Payer: Self-pay | Admitting: Internal Medicine

## 2024-01-14 ENCOUNTER — Other Ambulatory Visit: Payer: Self-pay | Admitting: Obstetrics and Gynecology

## 2024-01-14 ENCOUNTER — Inpatient Hospital Stay (HOSPITAL_COMMUNITY)
Admission: EM | Admit: 2024-01-14 | Discharge: 2024-01-14 | Disposition: A | Discharge status: Home / Self Care | Payer: Self-pay | Attending: Obstetrics & Gynecology | Admitting: Obstetrics & Gynecology

## 2024-01-14 ENCOUNTER — Inpatient Hospital Stay (HOSPITAL_COMMUNITY): Payer: Self-pay

## 2024-01-14 DIAGNOSIS — Z3A11 11 weeks gestation of pregnancy: Secondary | ICD-10-CM

## 2024-01-14 DIAGNOSIS — N920 Excessive and frequent menstruation with regular cycle: Secondary | ICD-10-CM

## 2024-01-14 DIAGNOSIS — E1165 Type 2 diabetes mellitus with hyperglycemia: Secondary | ICD-10-CM

## 2024-01-14 DIAGNOSIS — Z794 Long term (current) use of insulin: Secondary | ICD-10-CM | POA: Insufficient documentation

## 2024-01-14 DIAGNOSIS — E1140 Type 2 diabetes mellitus with diabetic neuropathy, unspecified: Secondary | ICD-10-CM | POA: Insufficient documentation

## 2024-01-14 DIAGNOSIS — T383X6A Underdosing of insulin and oral hypoglycemic [antidiabetic] drugs, initial encounter: Secondary | ICD-10-CM | POA: Insufficient documentation

## 2024-01-14 DIAGNOSIS — Z7984 Long term (current) use of oral hypoglycemic drugs: Secondary | ICD-10-CM | POA: Insufficient documentation

## 2024-01-14 DIAGNOSIS — Z91128 Patient's intentional underdosing of medication regimen for other reason: Secondary | ICD-10-CM | POA: Insufficient documentation

## 2024-01-14 DIAGNOSIS — O30011 Twin pregnancy, monochorionic/monoamniotic, first trimester: Secondary | ICD-10-CM

## 2024-01-14 DIAGNOSIS — E0865 Diabetes mellitus due to underlying condition with hyperglycemia: Secondary | ICD-10-CM

## 2024-01-14 DIAGNOSIS — O26851 Spotting complicating pregnancy, first trimester: Secondary | ICD-10-CM

## 2024-01-14 DIAGNOSIS — O24111 Pre-existing diabetes mellitus, type 2, in pregnancy, first trimester: Secondary | ICD-10-CM | POA: Insufficient documentation

## 2024-01-14 LAB — ABO/RH: ABO/RH(D): O POS

## 2024-01-14 LAB — CBC
HCT: 43 % (ref 36.0–46.0)
Hemoglobin: 15.4 g/dL — ABNORMAL HIGH (ref 12.0–15.0)
MCH: 30.1 pg (ref 26.0–34.0)
MCHC: 35.8 g/dL (ref 30.0–36.0)
MCV: 84.1 fL (ref 80.0–100.0)
Platelets: 256 K/uL (ref 150–400)
RBC: 5.11 MIL/uL (ref 3.87–5.11)
RDW: 11.9 % (ref 11.5–15.5)
WBC: 9 K/uL (ref 4.0–10.5)
nRBC: 0 % (ref 0.0–0.2)

## 2024-01-14 LAB — WET PREP, GENITAL
Clue Cells Wet Prep HPF POC: NONE SEEN
Sperm: NONE SEEN
Trich, Wet Prep: NONE SEEN
WBC, Wet Prep HPF POC: <10 (ref <10)
Yeast Wet Prep HPF POC: NONE SEEN

## 2024-01-14 LAB — COMPREHENSIVE METABOLIC PANEL WITH GFR
ALT: 20 U/L (ref 0–44)
AST: 13 U/L — ABNORMAL LOW (ref 15–41)
Albumin: 3.8 g/dL (ref 3.5–5.0)
Alkaline Phosphatase: 100 U/L (ref 38–126)
Anion gap: 10 (ref 5–15)
BUN: 9 mg/dL (ref 6–20)
CO2: 22 mmol/L (ref 22–32)
Calcium: 9.5 mg/dL (ref 8.9–10.3)
Chloride: 101 mmol/L (ref 98–111)
Creatinine, Ser: 0.54 mg/dL (ref 0.44–1.00)
GFR, Estimated: >60 mL/min (ref >60)
Glucose, Bld: 239 mg/dL — ABNORMAL HIGH (ref 70–99)
Potassium: 3.4 mmol/L — ABNORMAL LOW (ref 3.5–5.1)
Sodium: 133 mmol/L — ABNORMAL LOW (ref 135–145)
Total Bilirubin: 1.3 mg/dL — ABNORMAL HIGH (ref 0.0–1.2)
Total Protein: 6.9 g/dL (ref 6.5–8.1)

## 2024-01-14 LAB — HCG, QUANTITATIVE, PREGNANCY: hCG, Beta Chain, Quant, S: 43926 m[IU]/mL — ABNORMAL HIGH (ref <5)

## 2024-01-14 LAB — POCT PREGNANCY, URINE: Preg Test, Ur: POSITIVE — AB

## 2024-01-14 LAB — BETA-HYDROXYBUTYRIC ACID: Beta-Hydroxybutyric Acid: 0.13 mmol/L (ref 0.05–0.27)

## 2024-01-14 MED ORDER — ONDANSETRON 4 MG PO TBDP
4.0000 mg | ORAL_TABLET | Freq: Four times a day (QID) | ORAL | 0 refills | Status: DC | PRN
  Filled 2024-01-14: qty 20, 5d supply, fill #0

## 2024-01-14 MED ORDER — DOXYLAMINE-PYRIDOXINE 10-10 MG PO TBEC
2.0000 | DELAYED_RELEASE_TABLET | Freq: Every day | ORAL | 5 refills | Status: DC
  Filled 2024-01-14: qty 100, 50d supply, fill #0

## 2024-01-14 NOTE — Telephone Encounter (Addendum)
 This RN spoke with pt via [employer] [phone]. Patient agreed to go to the Emergency Department via another driver.   Copied from CRM [id number]. Topic: Clinical - Red Word Triage >> Jan 14, 2024  3:00 PM Zebedee SAUNDERS wrote: Red Word that prompted transfer to Nurse Triage: Pt is pregnant 2 to 3 not sure but she is mild bleeding. Reason for Disposition  [1] MILD vaginal bleeding (i.e., less than 1 pad / hour; less than patient's usual menstrual bleeding; not just spotting) AND [2] pregnant > 12 weeks  Answer Assessment - Initial Assessment Questions Patient is a diabetic and has not had access to insulin  x 1 month. States she does not have testing supplies available to check her blood sugar. States that she is having difficulty drinking adequate water.  1. ONSET: When did this bleeding start?       First occured on June 24 for 2 days and resolved. Last night, she had spotting before bed. At 1 am today, she had spotting again. Denies spotting at this time.  2. BLEEDING SEVERITY: Describe the bleeding that you are having. How much bleeding is there?      Spotting, worse after intercourse.  3. ABDOMEN PAIN: Do you have any pain? How bad is the pain?  (e.g., Scale 0-10; none, mild, moderate, or severe)     Sensation of something hitting my belly. States that is mid-abdomen. Resolved with eating and returns one hour later.  4. PREGNANCY: Do you know how many weeks or months pregnant you are? When was the first day of your last normal menstrual period?     2-3 months, unsure of last menstrual cycle, states is always irregular. 3 positive pregnancy tests.  5. ULTRASOUND: Have you had an ultrasound during this pregnancy?  Note: To confirm intrauterine pregnancy, placenta location.     No, has appt with a provider on July 28  6. HEMODYNAMIC STATUS: Are you weak or feeling lightheaded? If Yes, ask: Can you stand and walk normally?      Reports positional dizziness, head  pressure and lightheadedness.  7. OTHER SYMPTOMS: What other symptoms are you having with the bleeding? (e.g., passed tissue, vaginal discharge, fever, menstrual-type cramps)     Nausea, increased hunger, denies vomiting.  Protocols used: Pregnancy - Vaginal Bleeding Less Than [redacted] Weeks EGA-A-AH

## 2024-01-14 NOTE — Telephone Encounter (Signed)
 Noted

## 2024-01-14 NOTE — Telephone Encounter (Signed)
 Denise C. With E2C2 got disconnected from the patient while providing a disposition of ED. This RN attempted to contact patient 3 times with no answer. Patient was advised to go to ED by [facility].

## 2024-01-14 NOTE — Progress Notes (Signed)
 Ambulatory diabetes education referral.

## 2024-01-14 NOTE — MAU Note (Addendum)
 [name] is a 38 y.o. at Unknown here in MAU reporting:   3 days ago she found out she was pregnant. She started noticing bleeding with sex, pink to red light bleeding. No pain.  Pt states she does heavy lifting at work and is unsure if that is the reason for the bleeding.   No active bleeding right now and not wearing a pad. Pt states she only noticed when she was having intercourse and when she used the bathroom and she wipes.   Last intercourse was last night.   Diabetic, not on insulin  currently.  Pt states she is feeling dizzy but this is not new, she states its because of her being diabetic.   Pain score: No pain   Vitals:   01/14/24 1954  BP: 121/66  Pulse: 77  Resp: 17  Temp: 97.8 F (36.6 C)  SpO2: 100%

## 2024-01-14 NOTE — Discharge Instructions (Signed)
 Tomar la prueba de azucar en ayunas y luego 1-2 horas despues del desayuno

## 2024-01-14 NOTE — Telephone Encounter (Unsigned)
 Copied from CRM [id number]. Topic: Clinical - Medication Refill >> Jan 14, 2024  2:55 PM Zebedee SAUNDERS wrote: Medication: glucose blood (TRUE METRIX BLOOD GLUCOSE TEST) test strip, Insulin  Glargine (BASAGLAR  KWIKPEN) 100 UNIT/ML, metFORMIN  (GLUCOPHAGE -XR) 500 MG 24 hr tablet  Has the patient contacted their pharmacy? Yes (Agent: If no, request that the patient contact the pharmacy for the refill. If patient does not wish to contact the pharmacy document the reason why and proceed with request.) (Agent: If yes, when and what did the pharmacy advise?)  This is the patient's preferred pharmacy:  [facility] [address], [address] [place] [address] Phone: [phone] Fax: [phone]  Is this the correct pharmacy for this prescription? Yes If no, delete pharmacy and type the correct one.   Has the prescription been filled recently? Yes  Is the patient out of the medication? Yes  Has the patient been seen for an appointment in the last year OR does the patient have an upcoming appointment? Yes  Can we respond through MyChart? Yes  Agent: Please be advised that Rx refills may take up to 3 business days. We ask that you follow-up with your pharmacy.

## 2024-01-14 NOTE — MAU Provider Note (Signed)
 Chief Complaint: Vaginal Bleeding  SUBJECTIVE HPI: Pamela Alvarado is a 38 y.o. H3E6976 at [redacted]w[redacted]d by LMP who presents to maternity admissions reporting pink to red spotting following intercourse. Not bleeding now. Not wearing a pad. Last intercourse last night. No abdominal pain now.   Concerned about how to manage DM in pregnancy. Currently noncompliant with Metformin (1 week), Insulin  (1 month), not monitoring due to need for strips. Refills request sent to PCP; appear to be sent today per my review of chart.   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  She denies vaginal bleeding, vaginal itching/burning, urinary symptoms, h/a, n/v, or fever/chills.    Scheduled for OB confirmation/intake at [facility] 7/28  In-person spanish interpreter used for encounter  HPI  Past Medical History:  Diagnosis Date   Hypertension    Ovarian cyst 2004   in [place]   Past Surgical History:  Procedure Laterality Date   CHOLECYSTECTOMY, LAPAROSCOPIC     I & D EXTREMITY Right 08/20/2020   Procedure: IRRIGATION AND DEBRIDEMENT EXTREMITY and closure of leg wound;  Surgeon: Beverley Evalene BIRCH, MD;  Location: [facility] OR;  Service: Orthopedics;  Laterality: Right;  IRRIGATION AND DEBRIDEMENT EXTREMITY and closure of leg wound   NO PAST SURGERIES     Social History   Socioeconomic History   Marital status: Married    Spouse name: Not on file   Number of children: Not on file   Years of education: Not on file   Highest education level: Not on file  Occupational History   Not on file  Tobacco Use   Smoking status: Never   Smokeless tobacco: Never  Substance and Sexual Activity   Alcohol use: No   Drug use: No   Sexual activity: Yes    Birth control/protection: None  Other Topics Concern   Not on file  Social History Narrative   ** Merged History Encounter **       Social Drivers of Corporate investment banker Strain: Not on file  Food Insecurity: Not on  file  Transportation Needs: Not on file  Physical Activity: Not on file  Stress: Not on file  Social Connections: Not on file  Intimate Partner Violence: Not on file   No current facility-administered medications on file prior to encounter.   Current Outpatient Medications on File Prior to Encounter  Medication Sig Dispense Refill   Blood Glucose Monitoring Suppl (TRUE METRIX METER) w/Device KIT Use to check blood sugar once daily. 1 kit 0   gabapentin  (NEURONTIN ) 100 MG capsule Take 3 capsules (300 mg total) by mouth at bedtime. 90 capsule 3   glucose blood (TRUE METRIX BLOOD GLUCOSE TEST) test strip Use to check blood sugar once daily. 100 each 2   Insulin  Glargine (BASAGLAR  KWIKPEN) 100 UNIT/ML Inject 22 Units into the skin daily. 15 mL 6   Insulin  Pen Needle 32G X 4 MM MISC use as directed 100 each 6   loperamide  (IMODIUM  A-D) 2 MG tablet Take 1 tablet (2 mg total) by mouth 4 (four) times daily as needed for diarrhea or loose stools. 30 tablet 0   metFORMIN  (GLUCOPHAGE -XR) 500 MG 24 hr tablet Take 4 tablets (2,000 mg total) by mouth daily. 120 tablet 6   nystatin -triamcinolone  ointment (MYCOLOG) Apply 1 application topically 2 (two) times daily. 30 g 0   promethazine  (PHENERGAN ) 25 MG tablet Take 1 tablet (25 mg total) by mouth every 8 (eight) hours as needed for nausea or vomiting.  20 tablet 0   sertraline  (ZOLOFT ) 25 MG tablet Take 1 tablet (25 mg total) by mouth daily. 30 tablet 3   TRUEplus Lancets 28G MISC Use to check blood sugar once daily. 100 each 1   No Known Allergies  ROS:  Pertinent positives/negatives listed above.  I have reviewed patient's Past Medical Hx, Surgical Hx, Family Hx, Social Hx, medications and allergies.   Physical Exam  Patient Vitals for the past 24 hrs:  BP Temp Temp src Pulse Resp SpO2 Height Weight  01/14/24 1954 121/66 97.8 F (36.6 C) Oral 77 17 100 % 4' 9 (1.448 m) 68 kg   Constitutional: Well-developed, well-nourished female in no acute  distress.  Cardiovascular: normal rate Respiratory: normal effort GI: Abd soft, non-tender. Pos BS x 4 MS: Extremities nontender, no edema, normal ROM Neurologic: Alert and oriented x 4.  GU: Neg CVAT.  LAB RESULTS Results for orders placed or performed during the hospital encounter of 01/14/24 (from the past 24 hours)  Pregnancy, urine POC     Status: Abnormal   Collection Time: 01/14/24  7:04 PM  Result Value Ref Range   Preg Test, Ur POSITIVE (A) NEGATIVE  Wet prep, genital     Status: None   Collection Time: 01/14/24  9:32 PM  Result Value Ref Range   Yeast Wet Prep HPF POC NONE SEEN NONE SEEN   Trich, Wet Prep NONE SEEN NONE SEEN   Clue Cells Wet Prep HPF POC NONE SEEN NONE SEEN   WBC, Wet Prep HPF POC <10 <10   Sperm NONE SEEN   CBC     Status: Abnormal   Collection Time: 01/14/24  9:51 PM  Result Value Ref Range   WBC 9.0 4.0 - 10.5 K/uL   RBC 5.11 3.87 - 5.11 MIL/uL   Hemoglobin 15.4 (H) 12.0 - 15.0 g/dL   HCT 56.9 63.9 - 53.9 %   MCV 84.1 80.0 - 100.0 fL   MCH 30.1 26.0 - 34.0 pg   MCHC 35.8 30.0 - 36.0 g/dL   RDW 88.0 88.4 - 84.4 %   Platelets 256 150 - 400 K/uL   nRBC 0.0 0.0 - 0.2 %  hCG, quantitative, pregnancy     Status: Abnormal   Collection Time: 01/14/24  9:51 PM  Result Value Ref Range   hCG, Beta Chain, Quant, S 43,926 (H) <5 mIU/mL  ABO/Rh     Status: None   Collection Time: 01/14/24  9:52 PM  Result Value Ref Range   ABO/RH(D) O POS    No rh immune globuloin      NOT A RH IMMUNE GLOBULIN CANDIDATE, PT RH POSITIVE Performed at [facility], [address], [place], [place] [address]     --/--/O POS (07/14 2152)  IMAGING US  OB LESS THAN 14 WEEKS WITH OB TRANSVAGINAL Result Date: 01/14/2024 CLINICAL DATA:  Vaginal bleeding with intercourse EXAM: TWIN OBSTETRIC <14WK US  AND TRANSVAGINAL OB US  TECHNIQUE: Both transabdominal and transvaginal ultrasound examinations were performed for complete evaluation of the gestation as well as the  maternal uterus, adnexal regions, and pelvic cul-de-sac. Transvaginal technique was performed to assess early pregnancy. COMPARISON:  None Available. FINDINGS: Number of IUPs:  2 Chorionicity/Amnionicity: Mono chorionic mono amniotic. No membrane is seen this time. TWIN 1 Yolk sac:  Yes Embryo:  Yes Cardiac Activity: Yes Heart Rate: 133 bpm CRL: 9.0 mm   6 w 6 d  US  EDC: 09/02/2024 TWIN 2 Yolk sac:  Yes Embryo:  Yes Cardiac Activity: Yes Heart Rate: 131 bpm CRL: 9.9 mm   7 w 1 d                  US  EDC: 08/31/2024 Subchorionic hemorrhage:  None visualized. Maternal uterus/adnexae: Normal ovaries. No free fluid in the pelvis. IMPRESSION: Viable twin gestation. No membrane is seen at this time suggesting monochorionic monoamniotic twins. No unexpected sonographic findings. Electronically Signed   By: Norman Gatlin M.D.   On: 01/14/2024 22:39   US  OB Comp AddL Gest Less 14 Wks Result Date: 01/14/2024 CLINICAL DATA:  Vaginal bleeding with intercourse EXAM: TWIN OBSTETRIC <14WK US  AND TRANSVAGINAL OB US  TECHNIQUE: Both transabdominal and transvaginal ultrasound examinations were performed for complete evaluation of the gestation as well as the maternal uterus, adnexal regions, and pelvic cul-de-sac. Transvaginal technique was performed to assess early pregnancy. COMPARISON:  None Available. FINDINGS: Number of IUPs:  2 Chorionicity/Amnionicity: Mono chorionic mono amniotic. No membrane is seen this time. TWIN 1 Yolk sac:  Yes Embryo:  Yes Cardiac Activity: Yes Heart Rate: 133 bpm CRL: 9.0 mm   6 w 6 d                  US  EDC: 09/02/2024 TWIN 2 Yolk sac:  Yes Embryo:  Yes Cardiac Activity: Yes Heart Rate: 131 bpm CRL: 9.9 mm   7 w 1 d                  US  EDC: 08/31/2024 Subchorionic hemorrhage:  None visualized. Maternal uterus/adnexae: Normal ovaries. No free fluid in the pelvis. IMPRESSION: Viable twin gestation. No membrane is seen at this time suggesting monochorionic monoamniotic twins. No  unexpected sonographic findings. Electronically Signed   By: Norman Gatlin M.D.   On: 01/14/2024 22:39    MAU Management/MDM: Orders Placed This Encounter  Procedures   Wet prep, genital   US  OB LESS THAN 14 WEEKS WITH OB TRANSVAGINAL   US  OB Comp AddL Gest Less 14 Wks   CBC   hCG, quantitative, pregnancy   Beta-hydroxybutyric acid   Comprehensive metabolic panel with GFR   POC Urine Pregnancy, ED (not at [facility] or [facility])   Pregnancy, urine POC   ABO/Rh   Discharge patient Discharge disposition: 01-Home or Self Care; Discharge patient date: 01/14/2024    Meds ordered this encounter  Medications   Doxylamine -Pyridoxine  (DICLEGIS ) 10-10 MG TBEC    Sig: Take 2 tablets by mouth at bedtime. If symptoms persist, add one tablet in the morning and one in the afternoon    Dispense:  100 tablet    Refill:  5   ondansetron  (ZOFRAN -ODT) 4 MG disintegrating tablet    Sig: Take 1 tablet (4 mg total) by mouth every 6 (six) hours as needed for nausea.    Dispense:  20 tablet    Refill:  0    ASSESSMENT 1. [redacted] weeks gestation of pregnancy   2. 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 Type 2 diabetes mellitus with hyperglycemia, with long-term current use of insulin  (HCC)   4. Spotting   Post-coital spotting on TP only. No abdominal pain.  Twin IUP FHRs 133 and 131. No subchorionic hemorrhage.   Poorly controlled T2DM due to medication noncompliance, refills called in today.  - recommended log of fasting and post prandial CBG and meals; bring log to appointments - ambulatory diabetes educator referral sent  PLAN Discharge  home with strict return precautions. Message for new HROB intake sent to [facility]  Allergies as of 01/14/2024   No Known Allergies      Medication List     STOP taking these medications    ibuprofen  600 MG tablet Commonly known as: ADVIL        TAKE these medications    Basaglar  KwikPen 100 UNIT/ML Inject 22 Units into the skin daily.    BD Pen Needle Nano U/F 32G X 4 MM Misc Generic drug: Insulin  Pen Needle Use como se indica. (use as directed)   Doxylamine -Pyridoxine  10-10 MG Tbec Commonly known as: Diclegis  Take 2 tablets by mouth at bedtime. If symptoms persist, add one tablet in the morning and one in the afternoon   gabapentin  100 MG capsule Commonly known as: NEURONTIN  Take 3 capsules (300 mg total) by mouth at bedtime.   loperamide  2 MG tablet Commonly known as: IMODIUM  A-D Take 1 tablet (2 mg total) by mouth 4 (four) times daily as needed for diarrhea or loose stools.   metFORMIN  500 MG 24 hr tablet Commonly known as: GLUCOPHAGE -XR Take 4 tablets (2,000 mg total) by mouth daily.   nystatin -triamcinolone  ointment Commonly known as: MYCOLOG Apply 1 application topically 2 (two) times daily.   ondansetron  4 MG disintegrating tablet Commonly known as: ZOFRAN -ODT Take 1 tablet (4 mg total) by mouth every 6 (six) hours as needed for nausea.   promethazine  25 MG tablet Commonly known as: PHENERGAN  Take 1 tablet (25 mg total) by mouth every 8 (eight) hours as needed for nausea or vomiting.   sertraline  25 MG tablet Commonly known as: ZOLOFT  Tome 1 tableta (25 mg en total) por va oral diariamente. (Take 1 tablet (25 mg total) by mouth daily.)   True Metrix Blood Glucose Test test strip Generic drug: glucose blood Use to check blood sugar once daily.   True Metrix Meter w/Device Kit Use to check blood sugar once daily.   TRUEplus Lancets 28G Misc Use to check blood sugar once daily.        Follow-up Information     Center for [facility] at [facility]. Call.   Specialty: Obstetrics and Gynecology Why: For new OB appointment Contact information: [address] [place]  [address] [phone]               Mardy Shropshire, MD FMOB Fellow, Faculty practice [facility], Center for [facility]  01/14/2024  11:31 PM

## 2024-01-15 ENCOUNTER — Telehealth: Payer: Self-pay | Admitting: Family Medicine

## 2024-01-15 ENCOUNTER — Other Ambulatory Visit: Payer: Self-pay

## 2024-01-15 LAB — GC/CHLAMYDIA PROBE AMP (~~LOC~~) NOT AT ARMC
Chlamydia: NEGATIVE
Comment: NEGATIVE
Comment: NORMAL
Neisseria Gonorrhea: NEGATIVE

## 2024-01-15 NOTE — Telephone Encounter (Signed)
 Called patient to schedule her to start prenatal care. Called patients mobile number someone answered and hung up after the introduction from the interpreter. Called patients home number and did not receive an answer so I left detailed message with call back number for patient to call the office back.

## 2024-01-16 ENCOUNTER — Other Ambulatory Visit: Payer: Self-pay

## 2024-01-16 NOTE — Telephone Encounter (Signed)
 Unable to refill per protocol, appointment needed.   Requested Prescriptions  Pending Prescriptions Disp Refills   glucose blood (TRUE METRIX BLOOD GLUCOSE TEST) test strip 100 each 2    Sig: Use to check blood sugar once daily.     Endocrinology: Diabetes - Testing Supplies Failed - 01/16/2024 10:49 AM      Failed - Valid encounter within last 12 months    Recent Outpatient Visits           1 year ago Type 2 diabetes mellitus with hyperglycemia, unspecified whether long term insulin  use (HCC)   [facility] [place]. [facility] [place], [place], [place]   1 year ago Type 2 diabetes mellitus with obesity [facility])   [facility] [place]. [facility] Vicci Sober B, MD   2 years ago Type 2 diabetes mellitus with obesity [facility])   [facility] [place]. [facility] [place], [place], [place]   2 years ago Type 2 diabetes mellitus with obesity [facility])   [facility] Of [place]. [facility] Vicci Sober B, MD   3 years ago Type 2 diabetes mellitus with obesity [facility])   [facility] Of [place]. [facility] Vicci Sober B, MD               Insulin  Glargine (BASAGLAR  [facility]) 100 UNIT/ML 15 mL 6    Sig: Inject 22 Units into the skin daily.     Endocrinology:  Diabetes - Insulins Failed - 01/16/2024 10:49 AM      Failed - HBA1C is between 0 and 7.9 and within 180 days    HbA1c, POC (controlled diabetic range)  Date Value Ref Range Status  09/06/2022 10.1 (A) 0.0 - 7.0 % Final         Failed - Valid encounter within last 6 months    Recent Outpatient Visits           1 year ago Type 2 diabetes mellitus with hyperglycemia, unspecified whether long term insulin  use (HCC)   [facility] [place]. [facility] [place], [place], [place]   1 year ago  Type 2 diabetes mellitus with obesity [facility])   [facility] Of [place]. [facility] Vicci Sober B, MD   2 years ago Type 2 diabetes mellitus with obesity [facility])   [facility] Of [place]. [facility] [place], [place], [place]   2 years ago Type 2 diabetes mellitus with [facility])   [facility] [place]. [facility] Vicci Sober B, MD   3 years ago Type 2 diabetes mellitus with obesity [facility])   [place] Dept Of . [facility] Vicci Sober NOVAK, MD               metFORMIN  (GLUCOPHAGE -XR) 500 MG 24 hr tablet 120 tablet 6    Sig: Take 4 tablets (2,000 mg total) by mouth daily.     Endocrinology:  Diabetes - Biguanides Failed - 01/16/2024 10:49 AM      Failed - HBA1C is between 0 and 7.9 and within 180  days    HbA1c, POC (controlled diabetic range)  Date Value Ref Range Status  09/06/2022 10.1 (A) 0.0 - 7.0 % Final         Failed - B12 Level in normal range and within 720 days    No results found for: VITAMINB12       Failed - Valid encounter within last 6 months    Recent Outpatient Visits           1 year ago Type 2 diabetes mellitus with hyperglycemia, unspecified whether long term insulin  use (HCC)   [facility] [place]. [facility] [place], [place], [place]   1 year ago Type 2 diabetes mellitus with obesity [facility])   [facility] [facility] Of [place]. [facility] Vicci Sober B, MD   2 years ago Type 2 diabetes mellitus with obesity [facility])   [place] [facility] Of [place]. [facility] [place], [place], [place]   2 years ago Type 2 diabetes mellitus with obesity [facility])   [facility] Dept Of [place]. [facility] Vicci Sober B, MD   3 years ago Type 2 diabetes mellitus  with obesity [facility])   [place] [facility] Dept Of [place]. [facility] Vicci Sober B, MD              Failed - CBC within normal limits and completed in the last 12 months    WBC  Date Value Ref Range Status  01/14/2024 9.0 4.0 - 10.5 K/uL Final   RBC  Date Value Ref Range Status  01/14/2024 5.11 3.87 - 5.11 MIL/uL Final   Hemoglobin  Date Value Ref Range Status  01/14/2024 15.4 (H) 12.0 - 15.0 g/dL Final  96/93/7975 84.5 11.1 - 15.9 g/dL Final   HCT  Date Value Ref Range Status  01/14/2024 43.0 36.0 - 46.0 % Final   Hematocrit  Date Value Ref Range Status  09/06/2022 45.1 34.0 - 46.6 % Final   MCHC  Date Value Ref Range Status  01/14/2024 35.8 30.0 - 36.0 g/dL Final   [facility]  Date Value Ref Range Status  01/14/2024 30.1 26.0 - 34.0 pg Final   MCV  Date Value Ref Range Status  01/14/2024 84.1 80.0 - 100.0 fL Final  09/06/2022 87 79 - 97 fL Final   No results found for: PLTCOUNTKUC, LABPLAT, POCPLA RDW  Date Value Ref Range Status  01/14/2024 11.9 11.5 - 15.5 % Final  09/06/2022 12.4 11.7 - 15.4 % Final         Passed - Cr in normal range and within 360 days    Creatinine, Ser  Date Value Ref Range Status  01/14/2024 0.54 0.44 - 1.00 mg/dL Final         Passed - eGFR in normal range and within 360 days    GFR calc Af Amer  Date Value Ref Range Status  02/22/2020 >60 >60 mL/min Final   GFR, Estimated  Date Value Ref Range Status  01/14/2024 >60 >60 mL/min Final    Comment:    (NOTE) Calculated using the CKD-EPI Creatinine Equation (2021)    eGFR  Date Value Ref Range Status  09/06/2022 120 >59 mL/min/1.73 Final

## 2024-01-17 ENCOUNTER — Other Ambulatory Visit: Payer: Self-pay

## 2024-01-17 ENCOUNTER — Other Ambulatory Visit: Payer: Self-pay | Admitting: Internal Medicine

## 2024-01-17 DIAGNOSIS — E1165 Type 2 diabetes mellitus with hyperglycemia: Secondary | ICD-10-CM

## 2024-01-24 ENCOUNTER — Telehealth: Payer: Self-pay

## 2024-01-24 NOTE — Telephone Encounter (Signed)
 Copied from CRM [id number]. Topic: Appointments - Scheduling Inquiry for Clinic >> Jan 22, 2024 11:06 AM Wess RAMAN wrote: Reason for CRM: Patient needs a hospital follow up from 7/21 visit.  Callback #: [phone] >> Jan 24, 2024 10:53 AM Vick D wrote: Would you please assist him with a HFU appointment? Thank you.

## 2024-01-31 ENCOUNTER — Ambulatory Visit: Payer: Self-pay

## 2024-01-31 ENCOUNTER — Telehealth (INDEPENDENT_AMBULATORY_CARE_PROVIDER_SITE_OTHER): Payer: Self-pay

## 2024-01-31 DIAGNOSIS — O30033 Twin pregnancy, monochorionic/diamniotic, third trimester: Secondary | ICD-10-CM | POA: Insufficient documentation

## 2024-01-31 DIAGNOSIS — O0990 Supervision of high risk pregnancy, unspecified, unspecified trimester: Secondary | ICD-10-CM

## 2024-01-31 DIAGNOSIS — O30011 Twin pregnancy, monochorionic/monoamniotic, first trimester: Secondary | ICD-10-CM | POA: Insufficient documentation

## 2024-01-31 DIAGNOSIS — Z3A09 9 weeks gestation of pregnancy: Secondary | ICD-10-CM

## 2024-01-31 DIAGNOSIS — O30032 Twin pregnancy, monochorionic/diamniotic, second trimester: Secondary | ICD-10-CM | POA: Insufficient documentation

## 2024-01-31 DIAGNOSIS — O0991 Supervision of high risk pregnancy, unspecified, first trimester: Secondary | ICD-10-CM

## 2024-01-31 DIAGNOSIS — E1169 Type 2 diabetes mellitus with other specified complication: Secondary | ICD-10-CM

## 2024-01-31 NOTE — Progress Notes (Deleted)
 New OB Intake  I connected with Pamela Alvarado  on 01/31/24 at 11:15 AM EDT by Telephone visit Video Visit and verified that I am speaking with the correct person using two identifiers. Nurse is located at [facility] and pt is located at home.  I discussed the limitations, risks, security and privacy concerns of performing an evaluation and management service by telephone and the availability of in person appointments. I also discussed with the patient that there may be a patient responsible charge related to this service. The patient expressed understanding and agreed to proceed.  I explained I am completing New OB Intake today. We discussed EDD of 08/31/24 based on US  at *** weeks. Pt is [id number]. I reviewed her allergies, medications and Medical/Surgical/OB history.    Patient Active Problem List   Diagnosis Date Noted   Type 2 diabetes mellitus with obesity (HCC) 07/19/2021   DM (diabetes mellitus) (HCC) 08/29/2020   Diabetes (HCC) 08/29/2020   Cellulitis 08/28/2020   Leg laceration 08/20/2020   SVD (spontaneous vaginal delivery) 09/21/2015   Pregnancy 09/20/2015     Concerns addressed today  Delivery Plans Plans to deliver at [facility] Discussed the nature of our practice with multiple providers including residents and students. Due to the size of the practice, the delivering provider may not be the same as those providing prenatal care.   Patient {Is/is not:9024} interested in water birth.  MyChart/Babyscripts MyChart access verified. I explained pt will have some visits in office and some virtually. Babyscripts instructions given and order placed. Patient verifies receipt of registration text/e-mail. Account successfully created and app downloaded. If patient is a candidate for Optimized scheduling, add to sticky note.   Blood Pressure Cuff/Weight Scale {blood pressure cuff:24241} Explained after first prenatal appt pt will check weekly and document in Babyscripts.  Patient {weight scale:28336}.  Anatomy US  Explained first scheduled US  will be around 19 weeks. Anatomy US  scheduled for *** at ***.  Is patient a CenteringPregnancy candidate?  {Accepted:19197::Accepted,Not a Candidate,Declined} Declined due to {Declined:19197::Schedule,Childcare,Group setting,Support person concern,Declined to say,Enrolled in [facility],***} Not a candidate due to {Not a Candidate:19197::DM,CHTN, medication controlled,Language barrier,>28 weeks,Multiple gestation (mono-mono or mono-di),Complex coordination of care needed,***} If accepted,    Is patient a Mom+Baby Combined Care candidate?  {Accepted:19197::Accepted,Declined,Not a candidate,***}   If accepted, confirm patient does not intend to move from the area for at least 12 months, then notify Mom+Baby staff  Is patient a candidate for Babyscripts Optimization? {babyscripts:31704}   First visit review I reviewed new OB appt with patient. Explained pt will be seen by *** at first visit. Discussed [place] genetic screening with patient. *** Panorama and Horizon.. Routine prenatal labs {collected today/needed at new OB visit:9024}   Last Pap No results found for: DIAGPAP  Pamela Alvarado Mulch, CMA 01/31/2024  11:41 AM

## 2024-01-31 NOTE — Progress Notes (Signed)
 New OB Intake  I connected with Pamela Alvarado  on 01/31/24 at 11:15 AM EDT by Telephone visit Video Visit and verified that I am speaking with the correct person using two identifiers. Nurse is located at [facility] and pt is located at home.  I discussed the limitations, risks, security and privacy concerns of performing an evaluation and management service by telephone and the availability of in person appointments. I also discussed with the patient that there may be a patient responsible charge related to this service. The patient expressed understanding and agreed to proceed.  I explained I am completing New OB Intake today. We discussed EDD of 08/31/24 based on US  at 7 weeks. Pt is [id number]. I reviewed her allergies, medications and Medical/Surgical/OB history.    Patient Active Problem List   Diagnosis Date Noted   Type 2 diabetes mellitus with obesity (HCC) 07/19/2021   DM (diabetes mellitus) (HCC) 08/29/2020   Diabetes (HCC) 08/29/2020   Cellulitis 08/28/2020   Leg laceration 08/20/2020   SVD (spontaneous vaginal delivery) 09/21/2015   Pregnancy 09/20/2015     Concerns addressed today  Delivery Plans Plans to deliver at [facility]. Discussed the nature of our practice with multiple providers including residents and students. Due to the size of the practice, the delivering provider may not be the same as those providing prenatal care.   Patient is not interested in water birth.  MyChart/Babyscripts MyChart access verified. I explained pt will have some visits in office and some virtually. Babyscripts instructions given and order placed. Patient verifies receipt of registration text/e-mail. Account successfully created and app downloaded. If patient is a candidate for Optimized scheduling, add to sticky note.   Blood Pressure Cuff/Weight Scale Pt has own BP Cuff, Explained after first prenatal appt pt will check weekly and document in Babyscripts. Patient does have weight  scale.  Anatomy US  Explained first scheduled US  will be around 19 weeks. Anatomy US  scheduled for 04/09/24 at 0900a.  Is patient a CenteringPregnancy candidate?  Offer at [facility]       Is patient a Mom+Baby Combined Care candidate?  Not a candidate   If accepted, confirm patient does not intend to move from the area for at least 12 months, then notify Mom+Baby staff  Is patient a candidate for Babyscripts Optimization?    First visit review I reviewed new OB appt with patient. Explained pt will be seen by Delilah Cedar, CNM at first visit. Discussed [place] genetic screening with patient. Needs Panorama and Horizon.. Routine prenatal labs needed at [facility] OB visit.   Last Pap Pap needed  Sharlet [name] Mulch, CMA 01/31/2024  11:41 AM

## 2024-02-03 ENCOUNTER — Encounter: Payer: Self-pay | Admitting: Certified Nurse Midwife

## 2024-02-14 ENCOUNTER — Ambulatory Visit (INDEPENDENT_AMBULATORY_CARE_PROVIDER_SITE_OTHER): Payer: Self-pay | Admitting: Certified Nurse Midwife

## 2024-02-14 ENCOUNTER — Other Ambulatory Visit: Payer: Self-pay

## 2024-02-14 ENCOUNTER — Encounter: Payer: Self-pay | Admitting: Certified Nurse Midwife

## 2024-02-14 VITALS — BP 129/86 | HR 91 | Wt 155.7 lb

## 2024-02-14 DIAGNOSIS — O30011 Twin pregnancy, monochorionic/monoamniotic, first trimester: Secondary | ICD-10-CM

## 2024-02-14 DIAGNOSIS — O0991 Supervision of high risk pregnancy, unspecified, first trimester: Secondary | ICD-10-CM

## 2024-02-14 DIAGNOSIS — Z758 Other problems related to medical facilities and other health care: Secondary | ICD-10-CM

## 2024-02-14 DIAGNOSIS — Z1332 Encounter for screening for maternal depression: Secondary | ICD-10-CM

## 2024-02-14 DIAGNOSIS — Z3A11 11 weeks gestation of pregnancy: Secondary | ICD-10-CM

## 2024-02-14 DIAGNOSIS — Z603 Acculturation difficulty: Secondary | ICD-10-CM

## 2024-02-14 DIAGNOSIS — E119 Type 2 diabetes mellitus without complications: Secondary | ICD-10-CM

## 2024-02-14 LAB — GLUCOSE, CAPILLARY: Glucose-Capillary: 167 mg/dL — ABNORMAL HIGH (ref 70–99)

## 2024-02-14 MED ORDER — METFORMIN HCL 500 MG PO TABS
500.0000 mg | ORAL_TABLET | Freq: Two times a day (BID) | ORAL | 5 refills | Status: DC
  Filled 2024-02-14: qty 60, 30d supply, fill #0
  Filled 2024-04-11: qty 60, 30d supply, fill #1

## 2024-02-14 MED ORDER — ASPIRIN 81 MG PO CHEW
81.0000 mg | CHEWABLE_TABLET | Freq: Every day | ORAL | 3 refills | Status: DC
  Filled 2024-02-14: qty 60, 60d supply, fill #0
  Filled 2024-04-11 (×2): qty 60, 60d supply, fill #1

## 2024-02-14 NOTE — Progress Notes (Unsigned)
 Please get Urine for GC/ Chl, ran out of urine at [facility]

## 2024-02-15 LAB — CBC/D/PLT+RPR+RH+ABO+RUBIGG...
Antibody Screen: NEGATIVE
Basophils Absolute: 0 x10E3/uL (ref 0.0–0.2)
Basos: 0 %
EOS (ABSOLUTE): 0.1 x10E3/uL (ref 0.0–0.4)
Eos: 1 %
HCV Ab: NONREACTIVE
HIV Screen 4th Generation wRfx: NONREACTIVE
Hematocrit: 42 % (ref 34.0–46.6)
Hemoglobin: 14.2 g/dL (ref 11.1–15.9)
Hepatitis B Surface Ag: NEGATIVE
Immature Grans (Abs): 0.1 x10E3/uL (ref 0.0–0.1)
Immature Granulocytes: 1 %
Lymphocytes Absolute: 1.7 x10E3/uL (ref 0.7–3.1)
Lymphs: 18 %
MCH: 30.4 pg (ref 26.6–33.0)
MCHC: 33.8 g/dL (ref 31.5–35.7)
MCV: 90 fL (ref 79–97)
Monocytes Absolute: 0.4 x10E3/uL (ref 0.1–0.9)
Monocytes: 4 %
Neutrophils Absolute: 7.4 x10E3/uL — ABNORMAL HIGH (ref 1.4–7.0)
Neutrophils: 76 %
Platelets: 248 x10E3/uL (ref 150–450)
RBC: 4.67 x10E6/uL (ref 3.77–5.28)
RDW: 13.1 % (ref 11.7–15.4)
RPR Ser Ql: NONREACTIVE
Rh Factor: POSITIVE
Rubella Antibodies, IGG: 1.45 {index} (ref >0.99)
WBC: 9.7 x10E3/uL (ref 3.4–10.8)

## 2024-02-15 LAB — COMPREHENSIVE METABOLIC PANEL WITH GFR
ALT: 30 IU/L (ref 0–32)
AST: 16 IU/L (ref 0–40)
Albumin: 4 g/dL (ref 3.9–4.9)
Alkaline Phosphatase: 116 IU/L (ref 44–121)
BUN/Creatinine Ratio: 12 (ref 9–23)
BUN: 10 mg/dL (ref 6–20)
Bilirubin Total: 1 mg/dL (ref 0.0–1.2)
CO2: 18 mmol/L — ABNORMAL LOW (ref 20–29)
Calcium: 9 mg/dL (ref 8.7–10.2)
Chloride: 100 mmol/L (ref 96–106)
Creatinine, Ser: 0.83 mg/dL (ref 0.57–1.00)
Globulin, Total: 2.7 g/dL (ref 1.5–4.5)
Glucose: 178 mg/dL — ABNORMAL HIGH (ref 70–99)
Potassium: 3.7 mmol/L (ref 3.5–5.2)
Sodium: 132 mmol/L — ABNORMAL LOW (ref 134–144)
Total Protein: 6.7 g/dL (ref 6.0–8.5)
eGFR: 94 mL/min/1.73 (ref >59)

## 2024-02-15 LAB — HEMOGLOBIN A1C
Est. average glucose Bld gHb Est-mCnc: 214 mg/dL
Hgb A1c MFr Bld: 9.1 % — ABNORMAL HIGH (ref 4.8–5.6)

## 2024-02-15 LAB — TSH: TSH: 1.09 u[IU]/mL (ref 0.450–4.500)

## 2024-02-15 LAB — PROTEIN / CREATININE RATIO, URINE
Creatinine, Urine: 84.5 mg/dL
Protein, Ur: 26.1 mg/dL
Protein/Creat Ratio: 309 mg/g{creat} — ABNORMAL HIGH (ref 0–200)

## 2024-02-15 LAB — HCV INTERPRETATION

## 2024-02-18 NOTE — Progress Notes (Signed)
 INITIAL PRENATAL VISIT  History:  Pamela Alvarado is a 38 y.o. [phone] at [redacted]w[redacted]d by early ultrasound being seen today for her first obstetrical visit.  Her obstetrical history is significant for advanced maternal age, large for gestational age, and obesity. Patient does intend to breast feed. Pregnancy history fully reviewed.  Patient reports no complaints. Pt is currently pregnant with Mono/Mono twin. She has a history of Type II diabetes and Hypertension without medication. Patient states that she has not checked her blood sugar in over 4 months. Was previously on Metformin  BID and 22 units of insulin . Patient reports not currently taking any medication for DM. Currently not of medication for BP either.   HISTORY: OB History  Gravida Para Term Preterm AB Living  6 3 3  0 2 3  SAB IAB Ectopic Multiple Live Births  2 0 0 0 3    # Outcome Date GA Lbr Len/2nd Weight Sex Type Anes PTL Lv  6 Current           5 Term 09/21/15 [redacted]w[redacted]d 06:30 / 00:35 7 lb 8.5 oz (3.416 kg) M Vag-Spont None  LIV     Name: ILEA, HILTON     Apgar1: 9  Apgar5: 9  4 SAB 2017          3 Term 02/02/10    M Vag-Spont  N LIV  2 Term 12/14/07    CHRISTELLA Vag-Spont   LIV     Complications: Intraamniotic Infection  1 SAB 2009            Obstetric Comments  Pt states with 1st Preg had be Induced due to amniotic fluid, she couldn't really explain what happened but baby's BP went high.    Patient will have a P[facility] completed [facility]   Past Medical History:  Diagnosis Date   Hypertension    Ovarian cyst 2004   in [place]   Past Surgical History:  Procedure Laterality Date   CHOLECYSTECTOMY, LAPAROSCOPIC     I & D EXTREMITY Right 08/20/2020   Procedure: IRRIGATION AND DEBRIDEMENT EXTREMITY and closure of leg wound;  Surgeon: Beverley Evalene BIRCH, MD;  Location: [facility] OR;  Service: Orthopedics;  Laterality: Right;  IRRIGATION AND DEBRIDEMENT EXTREMITY and closure of leg wound   NO PAST SURGERIES      Family History  Problem Relation Age of Onset   Diabetes Paternal Grandmother    Diabetes Paternal Grandfather    Diabetes Maternal Grandmother    Tuberculosis Maternal Grandfather    Social History   Tobacco Use   Smoking status: Never   Smokeless tobacco: Never  Substance Use Topics   Alcohol  use: No   Drug use: No   No Known Allergies Current Outpatient Medications on File Prior to Visit  Medication Sig Dispense Refill   Prenatal Vit-Fe Fumarate-FA (MULTIVITAMIN-PRENATAL) 27-0.8 MG TABS tablet Take 1 tablet by mouth daily at 12 noon.     Blood Glucose Monitoring Suppl (TRUE METRIX METER) w/Device KIT Use to check blood sugar once daily. (Patient not taking: Reported on 01/31/2024) 1 kit 0   Doxylamine -Pyridoxine  (DICLEGIS ) 10-10 MG TBEC Take 2 tablets by mouth at bedtime. If symptoms persist, add one tablet in the morning and one in the afternoon (Patient not taking: Reported on 01/31/2024) 100 tablet 5   gabapentin  (NEURONTIN ) 100 MG capsule Take 3 capsules (300 mg total) by mouth at bedtime. (Patient not taking: Reported on 02/14/2024) 90 capsule 3   glucose blood (TRUE  METRIX BLOOD GLUCOSE TEST) test strip Use to check blood sugar once daily. (Patient not taking: Reported on 01/31/2024) 100 each 2   Insulin  Glargine (BASAGLAR  KWIKPEN) 100 UNIT/ML Inject 22 Units into the skin daily. (Patient not taking: Reported on 01/31/2024) 15 mL 6   Insulin  Pen Needle 32G X 4 MM MISC use as directed (Patient not taking: Reported on 01/31/2024) 100 each 6   loperamide  (IMODIUM  A-D) 2 MG tablet Take 1 tablet (2 mg total) by mouth 4 (four) times daily as needed for diarrhea or loose stools. 30 tablet 0   metFORMIN  (GLUCOPHAGE -XR) 500 MG 24 hr tablet Take 4 tablets (2,000 mg total) by mouth daily. (Patient not taking: Reported on 01/31/2024) 120 tablet 6   nystatin -triamcinolone  ointment (MYCOLOG) Apply 1 application topically 2 (two) times daily. (Patient not taking: Reported on 01/31/2024) 30 g 0    ondansetron  (ZOFRAN -ODT) 4 MG disintegrating tablet Take 1 tablet (4 mg total) by mouth every 6 (six) hours as needed for nausea. (Patient not taking: Reported on 02/14/2024) 20 tablet 0   promethazine  (PHENERGAN ) 25 MG tablet Take 1 tablet (25 mg total) by mouth every 8 (eight) hours as needed for nausea or vomiting. (Patient not taking: Reported on 01/31/2024) 20 tablet 0   sertraline  (ZOLOFT ) 25 MG tablet Take 1 tablet (25 mg total) by mouth daily. (Patient not taking: Reported on 01/31/2024) 30 tablet 3   TRUEplus Lancets 28G MISC Use to check blood sugar once daily. (Patient not taking: Reported on 01/31/2024) 100 each 1   No current facility-administered medications on file prior to visit.    Review of Systems Pertinent items noted in HPI and remainder of comprehensive ROS otherwise negative.  Indications for ASA therapy (per UpToDate) One of the following: (Needs 162 mg daily) Previous pregnancy with preeclampsia, especially early onset and with an adverse outcome No Chronic hypertension Yes Type 1 or 2 diabetes mellitus Yes Multifetal gestation Yes Chronic kidney disease No Autoimmune disease (antiphospholipid syndrome, systemic lupus erythematosus) No Two or more of the following: (Can do 81 mg daily) Nulliparity No Obesity (body mass index >30 kg/m2) Yes Family history of preeclampsia in mother or sister Yes Age >=35 years Yes Sociodemographic characteristics (African American race, low socioeconomic level) Yes Personal risk factors (eg, previous pregnancy with low birth weight or small for gestational age infant, previous adverse pregnancy outcome [eg, stillbirth], interval >10 years between pregnancies) No In vitro conception No  Physical Exam:   Vitals:   02/14/24 0931  BP: 129/86  Pulse: 91  Weight: 155 lb 11.2 oz (70.6 kg)   Fetal Heart Rate (bpm): 165    General: well-developed, well-nourished female in no acute distress  Skin: normal coloration and turgor, no  rashes  Neurologic: oriented, normal, negative, normal mood  Extremities: normal strength, tone, and muscle mass, ROM of all joints is normal  HEENT PERRLA, extraocular movement intact and sclera clear, anicteric  Neck supple and no masses  Cardiovascular: regular rate and rhythm  Respiratory:  no respiratory distress, normal breath sounds  Abdomen: soft, non-tender; bowel sounds normal; no masses,  no organomegaly   Assessment:  Pregnancy: [id number] Patient Active Problem List   Diagnosis Date Noted   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 Supervision of high risk pregnancy in first trimester (Primary) - Patient overall doing well.  - Culture, OB Urine - GC/Chlamydia probe amp ([place])not at [facility] - CBC/D/Plt+RPR+Rh+ABO+RubIgG... - Hemoglobin A1c - Comprehensive metabolic panel with GFR - Protein / creatinine ratio, urine - TSH - PANORAMA PRENATAL TEST - HORIZON Basic Panel  2. [redacted] weeks gestation of pregnancy - Counseled on starting BB ASA starting Next week  - Culture, OB Urine - GC/Chlamydia probe amp ([place])not at [facility] - CBC/D/Plt+RPR+Rh+ABO+RubIgG... - Hemoglobin A1c - Comprehensive metabolic panel with GFR - Protein / creatinine ratio, urine - TSH - PANORAMA PRENATAL TEST - HORIZON Basic Panel  3. Monoamniotic and monochorionic twin gestation in first trimester - Recommended close follow up given high risk pregnancy. Plan for visit next week.   4. Type 2 diabetes mellitus without complication, unspecified whether long term insulin  use (HCC) - Patient reports eating 3 tacos a banana and cereal this morning at  0500 - Fingerstick in office 167 - Consulted with Dr. Izell for restarting medication. Per MD monitor CBG through the weekend and schedule appt with Diabetes education for next week. Have patient send logs in prior to appt so that if medication is necessary education can tell her how to take them.  - Supplies given in office today.   5. Language barrier Due to language barrier, an interpreter was present during the history-taking and subsequent discussion (and for part of the physical exam) with this patient. - In-person spanish interpreter used for the duration of this visit.    Initial labs drawn. Continue prenatal vitamins. Problem list reviewed and updated. Genetic Screening discussed, Panorama and Horizon: ordered. Ultrasound discussed; fetal anatomic survey: planned. Anticipatory guidance about prenatal visits given including labs, ultrasounds, and testing. Weight gain recommendations per IOM guidelines reviewed: underweight/BMI 18.5 or less > 28 - 40 lbs; normal weight/BMI 18.5 - 24.9 > 25 - 35 lbs; overweight/BMI 25 - 29.9 > 15 - 25 lbs; obese/BMI 30 or more > 11 - 20 lbs. Discussed usage of the Babyscripts app for more information about pregnancy, and to track blood pressures. Also discussed usage of virtual visits as additional source of managing and completing prenatal visits.  Patient was encouraged to use MyChart to review results, send requests, and have questions addressed.   The nature of [facility]/Faculty Practice with multiple MDs and Advanced Practice Providers was explained to patient; also emphasized that residents, students are part of our team. Routine obstetric precautions reviewed. Encouraged to seek out care at our office or emergency room [facility] preferred) for urgent and/or emergent concerns. Return in about 2 weeks (around 02/28/2024) for HROB with MD .     Delilah Hensen) Emilio, MSN, CNM  [facility]  02/18/2024 11:38  AM

## 2024-02-19 ENCOUNTER — Ambulatory Visit: Payer: Self-pay

## 2024-02-19 ENCOUNTER — Other Ambulatory Visit: Payer: Self-pay

## 2024-02-19 ENCOUNTER — Ambulatory Visit: Payer: Self-pay | Admitting: Certified Nurse Midwife

## 2024-02-19 ENCOUNTER — Other Ambulatory Visit: Payer: Self-pay | Admitting: Family Medicine

## 2024-02-19 ENCOUNTER — Telehealth: Payer: Self-pay | Admitting: Dietician

## 2024-02-19 ENCOUNTER — Encounter: Payer: Self-pay | Attending: Certified Nurse Midwife | Admitting: Dietician

## 2024-02-19 ENCOUNTER — Other Ambulatory Visit (HOSPITAL_BASED_OUTPATIENT_CLINIC_OR_DEPARTMENT_OTHER): Payer: Self-pay

## 2024-02-19 DIAGNOSIS — E1169 Type 2 diabetes mellitus with other specified complication: Secondary | ICD-10-CM

## 2024-02-19 DIAGNOSIS — O24919 Unspecified diabetes mellitus in pregnancy, unspecified trimester: Secondary | ICD-10-CM | POA: Insufficient documentation

## 2024-02-19 DIAGNOSIS — Z3A00 Weeks of gestation of pregnancy not specified: Secondary | ICD-10-CM | POA: Insufficient documentation

## 2024-02-19 DIAGNOSIS — O0990 Supervision of high risk pregnancy, unspecified, unspecified trimester: Secondary | ICD-10-CM | POA: Insufficient documentation

## 2024-02-19 DIAGNOSIS — E119 Type 2 diabetes mellitus without complications: Secondary | ICD-10-CM

## 2024-02-19 LAB — CULTURE, OB URINE

## 2024-02-19 LAB — URINE CULTURE, OB REFLEX

## 2024-02-19 MED ORDER — "INSULIN SYRINGE 31G X 5/16"" 0.3 ML MISC"
1.0000 | 11 refills | Status: DC | PRN
  Filled 2024-02-19: qty 100, 50d supply, fill #0
  Filled 2024-04-11: qty 100, 50d supply, fill #1

## 2024-02-19 MED ORDER — INSULIN LISPRO (1 UNIT DIAL) 100 UNIT/ML (KWIKPEN)
11.0000 [IU] | PEN_INJECTOR | Freq: Three times a day (TID) | SUBCUTANEOUS | 11 refills | Status: DC
  Filled 2024-02-19: qty 9, 27d supply, fill #0
  Filled 2024-02-19: qty 15, 46d supply, fill #0
  Filled 2024-04-11 (×2): qty 9, 27d supply, fill #1
  Filled 2024-05-07 (×2): qty 9, 27d supply, fill #2
  Filled 2024-06-10: qty 9, 27d supply, fill #3

## 2024-02-19 MED ORDER — ALCOHOL PADS 70 % PADS
1.0000 | MEDICATED_PAD | 11 refills | Status: DC | PRN
  Filled 2024-02-19 – 2024-04-11 (×3): qty 100, fill #0

## 2024-02-19 MED ORDER — INSULIN ASPART 100 UNIT/ML FLEXPEN
11.0000 [IU] | PEN_INJECTOR | Freq: Three times a day (TID) | SUBCUTANEOUS | 11 refills | Status: DC
  Filled 2024-02-19: qty 15, fill #0
  Filled 2024-02-19: qty 15, 45d supply, fill #0

## 2024-02-19 MED ORDER — PEN NEEDLES 31G X 5 MM MISC
1.0000 | 11 refills | Status: DC | PRN
  Filled 2024-02-19: qty 100, 25d supply, fill #0
  Filled 2024-04-11: qty 100, 25d supply, fill #1

## 2024-02-19 MED ORDER — INSULIN NPH (HUMAN) (ISOPHANE) 100 UNIT/ML ~~LOC~~ SUSP
8.0000 [IU] | Freq: Two times a day (BID) | SUBCUTANEOUS | 11 refills | Status: DC
  Filled 2024-02-19: qty 10, 28d supply, fill #0

## 2024-02-19 MED ORDER — CEFADROXIL 500 MG PO CAPS
500.0000 mg | ORAL_CAPSULE | Freq: Two times a day (BID) | ORAL | 0 refills | Status: AC
  Filled 2024-02-19: qty 14, 7d supply, fill #0

## 2024-02-19 NOTE — Progress Notes (Signed)
 Patient was seen for Pre-existing Diabetes During Pregnancy) on 02/19/2024 Start time 1136 and End time 1236   Estimated due date: August 31, 2024; [redacted]w[redacted]d  Twins  Interpreter from [place], Name:  Hadassah, #[id number]  Clinical: Medications: baby aspirin , prenatal vitamin, Basaglar  (Patient states that her MD asked her to hold on this as well as hold on the metformin  as well pre-pregnancy.) Medical History: Type 2 Diabetes (2022), pregnancy (1st trimester), HTN Labs: A1c A1C 9.1% 02/14/2024, 10.8% 08/29/2020%   Dietary and Lifestyle History: Patient lives with her husband and 3 children.  They share shopping and cooking. She is not working currently. She states that she does not have insurance and cannot afford insulin . Uses [facility] at [address].  Physical Activity: walks 3 times a week for 30 minutes Stress: low Sleep: good  24 hr Recall:  First Meal:  sandwich with ham, beans, cheese, lettuce, mayo OR boiled squash, eggs Snack:  none Second meal:  salad with fish Snack:  none Third meal:  none Snack:  none Beverages:  water, pineapple blended with water, unsweetened hibiscus tea She was fasting some yesterday to see if this would improve her blood glucose.  NUTRITION INTERVENTION  Nutrition education (E-1) on the following topics:   Initial Follow-up  [x]  []  Definition of Gestational Diabetes [x]  []  Why dietary management is important in controlling blood glucose [x]  []  Effects each nutrient has on blood glucose levels [x]  []  Simple carbohydrates vs complex carbohydrates [x]  []  Fluid intake [x]  []  Creating a balanced meal plan []  []  Carbohydrate counting  [x]  []  When to check blood glucose levels [x]  []  Proper blood glucose monitoring techniques [x]  []  Effect of stress and stress reduction techniques  [x]  []  Exercise effect on blood glucose levels, appropriate exercise during pregnancy []  []  Importance of limiting caffeine  and abstaining from  alcohol  and smoking [x]  []  Medications used for blood sugar control during pregnancy [x]  []  Hypoglycemia and rule of 15 [x]  []  Postpartum self care  Blood glucose monitor given: Prodigy autocode as she is uninsured Lot # [id number] Meter Lot # [id number] A-4 Exp:  04/04/2025 CBG: 235 mg/dL Send a message to MD for insulin  orders.  TrueMetrix. Patient has a meter prior to visit. Patient is  testing pre breakfast and 2 hours after each meal. FBS: 141-170  Postprandial: 109-275  Patient instructed to monitor glucose levels: FBS: 60 - <= 95 mg/dL; 2 hour: <= 879 mg/dL  Patient received handouts: in Spanish Nutrition Diabetes and Pregnancy Carbohydrate Counting List Blood glucose log Snack ideas for diabetes during pregnancy  Patient will be seen for follow-up as needed.

## 2024-02-19 NOTE — Addendum Note (Signed)
 Addended by: Aishi Courts on: 02/19/2024 01:45 PM   Modules accepted: Orders

## 2024-02-19 NOTE — Telephone Encounter (Signed)
 Call made using [employer] interpretors, Lani [phone]  Called patient to inform her that her insulin  and supplies have been sent to the pharmacy and that she should pick these up and start taking them today.  Leita Constable, RD, LDN, CDCES, DipACLM

## 2024-02-19 NOTE — Progress Notes (Addendum)
 Insulin  orders, known type 2 DM, currently [redacted] weeks pregnant, will do weight based dosing 70*.7= 49u TDD  NPH 8u BID Lispro 11u TID  Orders placed

## 2024-02-20 ENCOUNTER — Other Ambulatory Visit: Payer: Self-pay

## 2024-02-21 LAB — PANORAMA PRENATAL TEST FULL PANEL:PANORAMA TEST PLUS 5 ADDITIONAL MICRODELETIONS: FETAL FRACTION: 7.8

## 2024-02-25 LAB — HORIZON CUSTOM: REPORT SUMMARY: NEGATIVE

## 2024-02-26 ENCOUNTER — Encounter: Payer: Self-pay | Admitting: Obstetrics and Gynecology

## 2024-02-26 ENCOUNTER — Other Ambulatory Visit: Payer: Self-pay

## 2024-02-26 ENCOUNTER — Other Ambulatory Visit (HOSPITAL_COMMUNITY)
Admission: RE | Admit: 2024-02-26 | Discharge: 2024-02-26 | Disposition: A | Discharge status: Home / Self Care | Payer: Self-pay | Source: Ambulatory Visit | Attending: Obstetrics and Gynecology | Admitting: Obstetrics and Gynecology

## 2024-02-26 ENCOUNTER — Ambulatory Visit (INDEPENDENT_AMBULATORY_CARE_PROVIDER_SITE_OTHER): Payer: Self-pay | Admitting: Obstetrics and Gynecology

## 2024-02-26 VITALS — BP 113/69 | HR 79 | Wt 157.0 lb

## 2024-02-26 DIAGNOSIS — O30011 Twin pregnancy, monochorionic/monoamniotic, first trimester: Secondary | ICD-10-CM

## 2024-02-26 DIAGNOSIS — O0991 Supervision of high risk pregnancy, unspecified, first trimester: Secondary | ICD-10-CM

## 2024-02-26 DIAGNOSIS — Z758 Other problems related to medical facilities and other health care: Secondary | ICD-10-CM

## 2024-02-26 DIAGNOSIS — E119 Type 2 diabetes mellitus without complications: Secondary | ICD-10-CM

## 2024-02-26 DIAGNOSIS — N3000 Acute cystitis without hematuria: Secondary | ICD-10-CM

## 2024-02-26 DIAGNOSIS — Z3A13 13 weeks gestation of pregnancy: Secondary | ICD-10-CM

## 2024-02-26 DIAGNOSIS — O0990 Supervision of high risk pregnancy, unspecified, unspecified trimester: Secondary | ICD-10-CM

## 2024-02-26 DIAGNOSIS — Z603 Acculturation difficulty: Secondary | ICD-10-CM

## 2024-02-26 NOTE — Progress Notes (Signed)
 PRENATAL VISIT NOTE  Subjective:  Pamela Alvarado is a 38 y.o. [phone] at [redacted]w[redacted]d being seen today for ongoing prenatal care.  She is currently monitored for the following issues for this high-risk pregnancy and has Supervision of high risk pregnancy, antepartum; SVD (spontaneous vaginal delivery); Leg laceration; Cellulitis; DM (diabetes mellitus) (HCC); Type 2 diabetes mellitus (HCC); Type 2 diabetes mellitus with obesity (HCC); Monoamniotic-monochorionic twin pregnancy in first trimester; and Language barrier on their problem list.  Patient reports has not taken meds as she didn't know what to take.  Contractions: Not present. Vag. Bleeding: None.  Movement: Present. Denies leaking of fluid.   The following portions of the patient's history were reviewed and updated as appropriate: allergies, current medications, past family history, past medical history, past social history, past surgical history and problem list.   Objective:    Vitals:   02/26/24 1040  BP: 113/69  Pulse: 79  Weight: 157 lb (71.2 kg)    Fetal Status:  Fetal Heart Rate (bpm): 174/161   Movement: Present    General: Alert, oriented and cooperative. Patient is in no acute distress.  Skin: Skin is warm and dry. No rash noted.   Cardiovascular: Normal heart rate noted  Respiratory: Normal respiratory effort, no problems with respiration noted  Abdomen: Soft, gravid, appropriate for gestational age.  Pain/Pressure: Absent     Pelvic: Cervical exam deferred        Extremities: Normal range of motion.  Edema: None  Mental Status: Normal mood and affect. Normal behavior. Normal judgment and thought content.   Bedside US : transverse, transverse, active fetal movement both twins Pt informed that the ultrasound is considered a limited OB ultrasound and is not intended to be a complete ultrasound exam.  Patient also informed that the ultrasound is not being completed with the intent of assessing for fetal or  placental anomalies or any pelvic abnormalities.  Explained that the purpose of today's ultrasound is to assess for  viability.  Patient acknowledges the purpose of the exam and the limitations of the study.    Assessment and Plan:  Pregnancy: [id number] at [redacted]w[redacted]d  1. Monoamniotic-monochorionic twin pregnancy in first trimester (Primary) Confirmed on US  01/14/24 16 weeks US  for TTTS surveillance ordered/scheduled  2. Supervision of high risk pregnancy, antepartum  3. Type 2 diabetes mellitus without complication, unspecified whether long term insulin  use (HCC) Rx NPH 8 units BID and lispro 11 units TID with meals, pt not taking as she states she was not sure what to take Fasting as high as 150s, post prandials as high as 297 Reviewed meds, dosing and schedule, importance of taking meds, risks of uncontrolled DM for mom and fetuses including stillbirth - fetal echo ordered  4. [redacted] weeks gestation of pregnancy  5. Language barrier Engineer, structural used.  6. Acute cystitis without hematuria Pt diagnosed last visit with UTI but has not taken her medication Reviewed meds, what to take and when   Preterm labor symptoms and general obstetric precautions including but not limited to vaginal bleeding, contractions, leaking of fluid and fetal movement were reviewed in detail with the patient. Please refer to After Visit Summary for other counseling recommendations.   Return in about 2 weeks (around 03/11/2024) for high OB.  Future Appointments  Date Time Provider Department Center  03/12/2024  8:35 AM Ilean Norleen GAILS, MD [facility]  03/26/2024  1:15 PM Eveline Lynwood MATSU, MD [facility]  04/09/2024  9:00 AM [facility] PROVIDER 1 [facility]  04/09/2024  9:30 AM [facility] US4 [facility]  04/23/2024  4:15 PM Ilean Norleen GAILS, MD [facility]  05/20/2024 11:15 AM [facility]-GENERAL 1 [facility]  06/10/2024 11:15 AM [facility]GENERAL 1 [facility]  06/24/2024  2:35 PM [facility]-GENERAL 1 [facility]  07/08/2024  1:15 PM  [facility]-GENERAL 1 [facility]  07/22/2024  1:15 PM [facility]-GENERAL 1 [facility]  08/05/2024  1:15 PM [facility]-GENERAL 1 [facility]  08/12/2024  1:15 PM W[facility]-GENERAL 1 [facility]  08/19/2024  1:15 PM W[facility]-GENERAL 1 [facility]  08/26/2024  1:15 PM [facility]-GENERAL 1 [facility]  09/02/2024  1:15 PM [facility]-GENERAL 1 [facility]    Burnard CHRISTELLA Moats, MD

## 2024-02-26 NOTE — Addendum Note (Signed)
 Addended byBETHA WENCESLAO BROWNING on: 02/26/2024 01:43 PM   Modules accepted: Orders

## 2024-02-28 LAB — GC/CHLAMYDIA PROBE AMP (~~LOC~~) NOT AT ARMC
Chlamydia: NEGATIVE
Comment: NEGATIVE
Comment: NORMAL
Neisseria Gonorrhea: NEGATIVE

## 2024-03-04 ENCOUNTER — Ambulatory Visit: Payer: Self-pay | Admitting: Obstetrics and Gynecology

## 2024-03-07 DIAGNOSIS — O99210 Obesity complicating pregnancy, unspecified trimester: Secondary | ICD-10-CM | POA: Insufficient documentation

## 2024-03-07 DIAGNOSIS — O09529 Supervision of elderly multigravida, unspecified trimester: Secondary | ICD-10-CM | POA: Insufficient documentation

## 2024-03-07 DIAGNOSIS — I10 Essential (primary) hypertension: Secondary | ICD-10-CM | POA: Insufficient documentation

## 2024-03-12 ENCOUNTER — Other Ambulatory Visit: Payer: Self-pay

## 2024-03-12 ENCOUNTER — Ambulatory Visit (INDEPENDENT_AMBULATORY_CARE_PROVIDER_SITE_OTHER): Payer: Self-pay | Admitting: Family Medicine

## 2024-03-12 VITALS — BP 106/69 | HR 73 | Wt 160.0 lb

## 2024-03-12 DIAGNOSIS — Z758 Other problems related to medical facilities and other health care: Secondary | ICD-10-CM

## 2024-03-12 DIAGNOSIS — Z3A15 15 weeks gestation of pregnancy: Secondary | ICD-10-CM

## 2024-03-12 DIAGNOSIS — E1165 Type 2 diabetes mellitus with hyperglycemia: Secondary | ICD-10-CM

## 2024-03-12 DIAGNOSIS — O0990 Supervision of high risk pregnancy, unspecified, unspecified trimester: Secondary | ICD-10-CM

## 2024-03-12 DIAGNOSIS — Z603 Acculturation difficulty: Secondary | ICD-10-CM

## 2024-03-12 DIAGNOSIS — O0992 Supervision of high risk pregnancy, unspecified, second trimester: Secondary | ICD-10-CM

## 2024-03-12 DIAGNOSIS — O30012 Twin pregnancy, monochorionic/monoamniotic, second trimester: Secondary | ICD-10-CM

## 2024-03-12 DIAGNOSIS — O30011 Twin pregnancy, monochorionic/monoamniotic, first trimester: Secondary | ICD-10-CM

## 2024-03-12 DIAGNOSIS — N3000 Acute cystitis without hematuria: Secondary | ICD-10-CM

## 2024-03-12 DIAGNOSIS — E119 Type 2 diabetes mellitus without complications: Secondary | ICD-10-CM

## 2024-03-12 MED ORDER — METFORMIN HCL ER 500 MG PO TB24
1000.0000 mg | ORAL_TABLET | Freq: Two times a day (BID) | ORAL | 6 refills | Status: DC
  Filled 2024-03-12 – 2024-04-11 (×2): qty 120, 30d supply, fill #0
  Filled 2024-05-07 (×2): qty 120, 30d supply, fill #1
  Filled 2024-06-10 (×2): qty 120, 30d supply, fill #2

## 2024-03-12 NOTE — Progress Notes (Signed)
 PRENATAL VISIT NOTE  Subjective:  Pamela Alvarado is a 38 y.o. [phone] at [redacted]w[redacted]d being seen today for ongoing prenatal care.  She is currently monitored for the following issues for this high-risk pregnancy and has Supervision of high risk pregnancy, antepartum; Type 2 diabetes mellitus (HCC); Monoamniotic-monochorionic twin pregnancy in first trimester; Language barrier; AMA (advanced maternal age) multigravida 35+; HTN (hypertension); and Obesity affecting pregnancy, antepartum on their problem list.  Patient reports no bleeding, no contractions, no cramping, and no leaking.  Contractions: Not present. Vag. Bleeding: None.  Movement: Absent. Denies leaking of fluid.   The following portions of the patient's history were reviewed and updated as appropriate: allergies, current medications, past family history, past medical history, past social history, past surgical history and problem list.   Objective:    Vitals:   03/12/24 0849  BP: 106/69  Pulse: 73  Weight: 160 lb (72.6 kg)    Fetal Status:      Movement: Absent    General: Alert, oriented and cooperative. Patient is in no acute distress.  Skin: Skin is warm and dry. No rash noted.   Cardiovascular: Normal heart rate noted  Respiratory: Normal respiratory effort, no problems with respiration noted  Abdomen: Soft, gravid, appropriate for gestational age.  Pain/Pressure: Absent     Pelvic: Cervical exam deferred        Extremities: Normal range of motion.  Edema: None  Mental Status: Normal mood and affect. Normal behavior. Normal judgment and thought content.   Assessment and Plan:  Pregnancy: [id number] at [redacted]w[redacted]d 1. Supervision of high risk pregnancy, antepartum (Primary) FHR BP appropriate today - AFP, Serum, Open Spina Bifida  2. Monoamniotic-monochorionic twin pregnancy in first trimester Scheduled to see MFM on 03/18/2024 Ultrasound today showing fetal heart rate 158 bpm and 156 bpm  3. Type 2 diabetes  mellitus without complication, unspecified whether long term insulin  use [facility]) Patient currently confused on regiment.  Taking 500 mg metformin  twice daily, 8 units of NPH twice daily, will eat, wait 2 hours and then take 11 units lispro after taking her blood sugar.  Blood sugars not consistent.  She has blood sugars in the 80s as well as blood sugars in the 190s. -Increase metformin  to 1000 mg twice daily - Continue 8 units NPH twice daily -11 units lispro with meals.  Counseled extensively.  Follow-up in 2 weeks. - AFP, Serum, Open Spina Bifida  4. Language barrier Spanish interpreter used for entire visit  5. Acute cystitis without hematuria Previously prescribed antibiotics  6. [redacted] weeks gestation of pregnancy  7. Type 2 diabetes mellitus with hyperglycemia, unspecified whether long term insulin  use (HCC) - metFORMIN  (GLUCOPHAGE -XR) 500 MG 24 hr tablet; Take 2 tablets (1,000 mg total) by mouth 2 (two) times daily with a meal.  Dispense: 120 tablet; Refill: 6  Preterm labor symptoms and general obstetric precautions including but not limited to vaginal bleeding, contractions, leaking of fluid and fetal movement were reviewed in detail with the patient. Please refer to After Visit Summary for other counseling recommendations.   No follow-ups on file.  Future Appointments  Date Time Provider Department Center  03/18/2024 10:00 AM [facility] PROVIDER 1 [facility]  03/18/2024 10:30 AM [facility] US3 [facility]  03/26/2024  1:15 PM Eveline Lynwood MATSU, MD [facility]  04/03/2024  9:15 AM [facility] PROVIDER 1 [facility]  04/03/2024  9:30 AM [facility] US2 [facility]  04/09/2024  9:00 AM [facility] PROVIDER 1 [facility]  04/09/2024  9:30  AM [facility] US4 [facility]  04/23/2024  4:15 PM Ilean Norleen GAILS, MD [facility]  05/20/2024 11:15 AM [facility]GENERAL 1 [facility]  06/10/2024 11:15 AM [facility]AL 1 [facility]  06/24/2024  2:35 PM [facility]GENERAL 1 [facility]  07/08/2024  1:15 PM [facility]AL 1 [facility]   07/22/2024  1:15 PM [facility]GENERAL 1 [facility]  08/05/2024  1:15 PM [facility]GENERAL 1 [facility]  08/12/2024  1:15 PM [facility]GENERAL 1 [facility]  08/19/2024  1:15 PM [facility]GENERAL 1 [facility]  08/26/2024  1:15 PM [facility]GENERAL 1 [facility]  09/02/2024  1:15 PM [facility]GENERAL 1 [facility]    Norleen GAILS Ilean, MD

## 2024-03-12 NOTE — Patient Instructions (Addendum)
 Hello,  You need to be taking 1000 mg (2 tablets) of metformin  twice daily after breakfast and after dinner.  Regarding insulin  he need to take 8 units NPH twice daily.  This needs to be 12 hours apart so you could take it at 8 AM and 8 PM.  You are also on mealtime insulin   lispro (Humalog ) which you will take 11 units 3 times daily with meals.  Only take it if you are eating a meal.  Please be sure to take your blood sugars in the morning before you have eaten anything and 2 hours after each meal.

## 2024-03-14 LAB — AFP, SERUM, OPEN SPINA BIFIDA
AFP MoM: 1.58
AFP Value: 36.5 ng/mL
Gest. Age on Collection Date: 15.3 wk
Maternal Age At EDD: 37 a
OSBR Risk 1 IN: 1315
Test Results:: NEGATIVE
Weight: 160 [lb_av]

## 2024-03-18 ENCOUNTER — Ambulatory Visit (HOSPITAL_BASED_OUTPATIENT_CLINIC_OR_DEPARTMENT_OTHER): Payer: Self-pay

## 2024-03-18 ENCOUNTER — Ambulatory Visit: Payer: Self-pay | Attending: Obstetrics and Gynecology | Admitting: Maternal & Fetal Medicine

## 2024-03-18 ENCOUNTER — Other Ambulatory Visit: Payer: Self-pay | Admitting: *Deleted

## 2024-03-18 VITALS — BP 111/59

## 2024-03-18 DIAGNOSIS — E669 Obesity, unspecified: Secondary | ICD-10-CM

## 2024-03-18 DIAGNOSIS — Z364 Encounter for antenatal screening for fetal growth retardation: Secondary | ICD-10-CM | POA: Insufficient documentation

## 2024-03-18 DIAGNOSIS — E119 Type 2 diabetes mellitus without complications: Secondary | ICD-10-CM

## 2024-03-18 DIAGNOSIS — O30011 Twin pregnancy, monochorionic/monoamniotic, first trimester: Secondary | ICD-10-CM

## 2024-03-18 DIAGNOSIS — O24112 Pre-existing diabetes mellitus, type 2, in pregnancy, second trimester: Secondary | ICD-10-CM

## 2024-03-18 DIAGNOSIS — O99212 Obesity complicating pregnancy, second trimester: Secondary | ICD-10-CM | POA: Insufficient documentation

## 2024-03-18 DIAGNOSIS — O09522 Supervision of elderly multigravida, second trimester: Secondary | ICD-10-CM | POA: Insufficient documentation

## 2024-03-18 DIAGNOSIS — Z794 Long term (current) use of insulin: Secondary | ICD-10-CM | POA: Insufficient documentation

## 2024-03-18 DIAGNOSIS — Z7984 Long term (current) use of oral hypoglycemic drugs: Secondary | ICD-10-CM | POA: Insufficient documentation

## 2024-03-18 DIAGNOSIS — Z3A16 16 weeks gestation of pregnancy: Secondary | ICD-10-CM

## 2024-03-18 DIAGNOSIS — O321XX0 Maternal care for breech presentation, not applicable or unspecified: Secondary | ICD-10-CM | POA: Insufficient documentation

## 2024-03-18 DIAGNOSIS — O99210 Obesity complicating pregnancy, unspecified trimester: Secondary | ICD-10-CM

## 2024-03-18 DIAGNOSIS — O30032 Twin pregnancy, monochorionic/diamniotic, second trimester: Secondary | ICD-10-CM | POA: Insufficient documentation

## 2024-03-18 DIAGNOSIS — Z7982 Long term (current) use of aspirin: Secondary | ICD-10-CM | POA: Insufficient documentation

## 2024-03-18 DIAGNOSIS — Z363 Encounter for antenatal screening for malformations: Secondary | ICD-10-CM | POA: Insufficient documentation

## 2024-03-18 DIAGNOSIS — Z79899 Other long term (current) drug therapy: Secondary | ICD-10-CM | POA: Insufficient documentation

## 2024-03-18 DIAGNOSIS — O30012 Twin pregnancy, monochorionic/monoamniotic, second trimester: Secondary | ICD-10-CM

## 2024-03-18 NOTE — Progress Notes (Signed)
 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 [id number] at [redacted]w[redacted]d here for ultrasound and consultation. She had Low risk aneuploidy screening of a female fetus. Carrier screening was Negative for the basic screening (SMA, alpha-thal, beta-thal, and cystic fibroisis. Maternal serum AFP was negative. She has no acute concerns.   Today we focused on the following:   MCDA twin pregnancy  The patient was sent here due to concern for a monochorionic monoamniotic twin pregnancy.  On today's ultrasound there is a thin dividing membrane suggestive of monochorionic diamniotic twins.  I discussed the potential pregnancy complications associated with this type of pregnancy as well as the prenatal course and need for close monitoring.  We specifically discussed the possibility of twin to twin transfusion syndrome, selective fetal growth restriction and other abnormalities unique to this type of pregnancy.  We discussed the maternal risks of preeclampsia, gestational diabetes, preterm birth, need for cesarean delivery and frequent monitoring.  The patient is compliant with aspirin  to reduce risk of preeclampsia. She will need a fetal echo ordered at her next visit.    Marginal cord insertion This was seen on the placenta of twinA's umbilical cord.  We discussed the possibility for  growth abnormalities and the need for future growth ultrasounds.  I discussed that everything typically done for this diagnosis will be done because of her twin pregnancy and no further alterations are needed at this time.   Type 2 diabetes The patient has type 2 diabetes reports that most of her blood sugars are well-controlled.  Currently she is taking insulin  and metformin .  Discussed the potential complications associate with diabetes in pregnancy and the need for proper management to reduce the risk of stillbirth and other complications.  She will continue to follow-up with her OB provider and bring her glucose log for assessment.  Assessment -Monochroionic diamnotic twin pregnancy Recommendations -Limited ultrasound was done today without abnormalities -Fortnightly ultrasounds to monitor fetal growth and screen for twin twin transfusion syndrome (TTTS) until delivery  -Chorionicity was confirmed - mo di -Baseline preeclampsia labs: CMP, CBC and urine protein/creatinine ratio if not previously completed.  -Diabetic care with OB provider. Try to achieve at least 75% control of all values.  -Aspirin  81 mg for preeclampsia prophylaxis -Serial growth ultrasounds starting around 28 weeks to monitor for fetal growth restriction -Fetal echo should be ordered at her next visit -Antenatal testing to start around 30-32 weeks due to the increased risk of stillbirth and high risk pregnancy -UA dopplers every two weeks to screen for TTTS -Delivery timing pending clinical course but likely around 34-36 weeks gestion -Continue routine prenatal care with referring OB provider Review of Systems: A review of systems was performed and was negative except per HPI   Past Obstetrical History:  OB History  Gravida Para Term Preterm AB Living  6 3 3  0 2 3  SAB IAB Ectopic Multiple Live Births  2 0 0  3    # Outcome Date GA Lbr Len/2nd Weight Sex Type Anes PTL Lv  6 Current           5 Term 09/21/15 [redacted]w[redacted]d  06:30 / 00:35 7 lb 8.5 oz (3.416 kg) M Vag-Spont None  LIV  4 SAB 2017          3 Term 02/02/10    M Vag-Spont  N LIV  2 Term 12/14/07    Pamela Alvarado Vag-Spont   LIV     Complications: Intraamniotic Infection  1 SAB 2009            Obstetric Comments  Pt states with 1st Preg had be Induced due to amniotic fluid, she couldn't really explain what happened but baby's BP went high.     Past Medical History:  Past Medical History:  Diagnosis Date   Cellulitis 08/28/2020   DM (diabetes mellitus) (HCC) 08/29/2020   Hypertension    Leg laceration 08/20/2020   Ovarian cyst 07/03/2002   in [place]   SVD (spontaneous vaginal delivery) 09/21/2015   Type 2 diabetes mellitus with obesity (HCC) 07/19/2021     Past Surgical History:    Past Surgical History:  Procedure Laterality Date   CHOLECYSTECTOMY, LAPAROSCOPIC     I & D EXTREMITY Right 08/20/2020   Procedure: IRRIGATION AND DEBRIDEMENT EXTREMITY and closure of leg wound;  Surgeon: Beverley Evalene BIRCH, MD;  Location: [facility] OR;  Service: Orthopedics;  Laterality: Right;  IRRIGATION AND DEBRIDEMENT EXTREMITY and closure of leg wound   NO PAST SURGERIES     WISDOM TOOTH EXTRACTION Bilateral      Home Medications:   Current Outpatient Medications on File Prior to Visit  Medication Sig Dispense Refill   Alcohol  Swabs  (ALCOHOL  PADS) 70 % PADS 1 Swab by Does not apply route as needed. 100 each 11   Blood Glucose Monitoring Suppl (TRUE METRIX METER) w/Device KIT Use to check blood sugar once daily. 1 kit 0   glucose blood (TRUE METRIX BLOOD GLUCOSE TEST) test strip Use to check blood sugar once daily. 100 each 2   Insulin  Glargine (BASAGLAR  KWIKPEN) 100 UNIT/ML Inject 22 Units into the skin daily. 15 mL 6   insulin  lispro (HUMALOG ) 100 UNIT/ML KwikPen Inject 11 Units into the skin 3 (three) times daily. 15 mL 11   insulin  NPH Human (NOVOLIN N) 100 UNIT/ML injection Inject 0.08 mLs (8 Units total) into the skin 2 (two) times daily before a meal. 10 mL 11    Insulin  Pen Needle (PEN NEEDLES) 31G X 5 MM MISC Use with insulin . 100 each 11   Insulin  Syringe-Needle U-100 (INSULIN  SYRINGE .3CC/31GX5/16) 31G X 5/16 0.3 ML MISC Use as directed. 100 each 11   metFORMIN  (GLUCOPHAGE -XR) 500 MG 24 hr tablet Take 2 tablets (1,000 mg total) by mouth 2 (two) times daily with a meal. 120 tablet 6   nystatin -triamcinolone  ointment (MYCOLOG) Apply 1 application topically 2 (two) times daily. 30 g 0   promethazine  (PHENERGAN ) 25 MG tablet Take 1 tablet (25 mg total) by mouth every 8 (eight) hours as needed for nausea or vomiting. 20 tablet 0   TRUEplus Lancets 28G MISC Use to check blood sugar once daily. 100 each 1   aspirin  81 MG chewable tablet Chew 1 tablet (81 mg total) by mouth daily. Start after 12 weeks 60 tablet 3   Doxylamine -Pyridoxine  (DICLEGIS ) 10-10 MG TBEC Take 2 tablets by mouth at bedtime. If symptoms persist, add one tablet  in the morning and one in the afternoon (Patient not taking: Reported on 03/18/2024) 100 tablet 5   metFORMIN  (GLUCOPHAGE ) 500 MG tablet Take 1 tablet (500 mg total) by mouth 2 (two) times daily with a meal. (Patient not taking: Reported on 03/18/2024) 60 tablet 5   ondansetron  (ZOFRAN -ODT) 4 MG disintegrating tablet Take 1 tablet (4 mg total) by mouth every 6 (six) hours as needed for nausea. (Patient not taking: Reported on 03/18/2024) 20 tablet 0   Prenatal Vit-Fe Fumarate-FA (MULTIVITAMIN-PRENATAL) 27-0.8 MG TABS tablet Take 1 tablet by mouth daily at 12 noon.     sertraline  (ZOLOFT ) 25 MG tablet Take 1 tablet (25 mg total) by mouth daily. (Patient not taking: Reported on 03/18/2024) 30 tablet 3   No current facility-administered medications on file prior to visit.      Allergies:   No Known Allergies   Physical Exam:   Vitals:   03/18/24 1020  BP: (!) 111/59   Sitting comfortably on the sonogram table Nonlabored breathing Normal rate and rhythm Abdomen is nontender  Thank you for the opportunity to be involved with this  patient's care. Please let us  know if we can be of any further assistance.   60 minutes of time was spent reviewing the patient's chart including labs, imaging and documentation.  At least 50% of this time was spent with direct patient care discussing the diagnosis, management and prognosis of her care.  Delora Smaller MFM, [place]   03/18/2024  1:48 PM

## 2024-03-20 ENCOUNTER — Other Ambulatory Visit: Payer: Self-pay

## 2024-03-21 ENCOUNTER — Other Ambulatory Visit: Payer: Self-pay

## 2024-03-26 ENCOUNTER — Ambulatory Visit (INDEPENDENT_AMBULATORY_CARE_PROVIDER_SITE_OTHER): Payer: Self-pay | Admitting: Obstetrics & Gynecology

## 2024-03-26 ENCOUNTER — Other Ambulatory Visit: Payer: Self-pay

## 2024-03-26 VITALS — BP 108/73 | HR 84 | Wt 163.0 lb

## 2024-03-26 DIAGNOSIS — O24112 Pre-existing diabetes mellitus, type 2, in pregnancy, second trimester: Secondary | ICD-10-CM

## 2024-03-26 DIAGNOSIS — E119 Type 2 diabetes mellitus without complications: Secondary | ICD-10-CM

## 2024-03-26 DIAGNOSIS — O99212 Obesity complicating pregnancy, second trimester: Secondary | ICD-10-CM

## 2024-03-26 DIAGNOSIS — O0990 Supervision of high risk pregnancy, unspecified, unspecified trimester: Secondary | ICD-10-CM

## 2024-03-26 DIAGNOSIS — Z603 Acculturation difficulty: Secondary | ICD-10-CM

## 2024-03-26 DIAGNOSIS — O0992 Supervision of high risk pregnancy, unspecified, second trimester: Secondary | ICD-10-CM

## 2024-03-26 DIAGNOSIS — Z3A17 17 weeks gestation of pregnancy: Secondary | ICD-10-CM

## 2024-03-26 DIAGNOSIS — O99210 Obesity complicating pregnancy, unspecified trimester: Secondary | ICD-10-CM

## 2024-03-26 DIAGNOSIS — O09522 Supervision of elderly multigravida, second trimester: Secondary | ICD-10-CM

## 2024-03-26 DIAGNOSIS — Z758 Other problems related to medical facilities and other health care: Secondary | ICD-10-CM

## 2024-03-26 DIAGNOSIS — O30032 Twin pregnancy, monochorionic/diamniotic, second trimester: Secondary | ICD-10-CM

## 2024-03-26 MED ORDER — INSULIN NPH (HUMAN) (ISOPHANE) 100 UNIT/ML ~~LOC~~ SUSP
10.0000 [IU] | Freq: Two times a day (BID) | SUBCUTANEOUS | 11 refills | Status: DC
  Filled 2024-03-26: qty 10, 31d supply, fill #0
  Filled 2024-04-11: qty 10, 31d supply, fill #1
  Filled 2024-06-10: qty 10, 31d supply, fill #2

## 2024-03-26 NOTE — Progress Notes (Signed)
   PRENATAL VISIT NOTE  Subjective:  Pamela Alvarado is a 38 y.o. [phone] at [redacted]w[redacted]d being seen today for ongoing prenatal care.  She is currently monitored for the following issues for this high-risk pregnancy and has Supervision of high risk pregnancy, antepartum; Type 2 diabetes mellitus affecting pregnancy in second trimester, antepartum; Monochorionic diamniotic twin gestation in second trimester; Language barrier; AMA (advanced maternal age) multigravida 35+; and Obesity affecting pregnancy, antepartum on their problem list.  Patient reports no complaints.  Contractions: Not present. Vag. Bleeding: None.  Movement: Present. Denies leaking of fluid.   The following portions of the patient's history were reviewed and updated as appropriate: allergies, current medications, past family history, past medical history, past social history, past surgical history and problem list.   Objective:    Vitals:   03/26/24 1327  BP: 108/73  Pulse: 84  Weight: 163 lb (73.9 kg)    Fetal Status:      Movement: Present    General: Alert, oriented and cooperative. Patient is in no acute distress.  Skin: Skin is warm and dry. No rash noted.   Cardiovascular: Normal heart rate noted  Respiratory: Normal respiratory effort, no problems with respiration noted  Abdomen: Soft, gravid, appropriate for gestational age.  Pain/Pressure: Absent     Pelvic: Cervical exam deferred        Extremities: Normal range of motion.  Edema: None  Mental Status: Normal mood and affect. Normal behavior. Normal judgment and thought content.   Assessment and Plan:  Pregnancy: [id number] at [redacted]w[redacted]d 1. [redacted] weeks gestation of pregnancy (Primary) [redacted]w[redacted]d   2. Type 2 diabetes mellitus affecting pregnancy in second trimester, antepartum FBS 50% >100, increase Humulin  N  3. Language barrier Spanish  4. Multigravida of advanced maternal age in second trimester   5. Obesity affecting pregnancy, antepartum, unspecified  obesity type   6. Supervision of high risk pregnancy, antepartum   7. 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 Please refer to After Visit Summary for other counseling recommendations.   Return in about 4 weeks (around 04/23/2024).  Future Appointments  Date Time Provider Department Center  04/03/2024  9:15 AM [facility] PROVIDER 1 [facility]  04/03/2024  9:30 AM [facility] US2 [facility]  04/09/2024  9:00 AM [facility] PROVIDER 1 [facility]  04/09/2024  9:30 AM [facility] US4 [facility]  04/23/2024  4:15 PM Ilean Norleen GAILS, MD [facility]  04/29/2024 10:15 AM [facility] PROVIDER 1 [facility]  04/29/2024 10:30 AM [facility] US4 [facility]  05/13/2024 10:15 AM [facility] PROVIDER 1 [facility]  05/13/2024 10:30 AM [facility] US5 [facility]  05/20/2024 11:15 AM Fredirick Glenys RAMAN, MD [facility]  05/27/2024 10:15 AM [facility] PROVIDER 1 [facility]  05/27/2024 10:30 AM [facility] US3 [facility]  06/10/2024 11:15 AM [facility]GENERAL 1 [facility]  06/24/2024  2:35 PM [facility]GENERAL 1 [facility]  07/08/2024  1:15 PM [facility]GENERAL 1 [facility]  07/22/2024  1:15 PM [facility]GENERAL 1 [facility]  08/05/2024  1:15 PM [facility]GENERAL 1 [facility]  08/12/2024  1:15 PM [facility]GENERAL 1 [facility]  08/19/2024  1:15 PM [facility]GENERAL 1 [facility]  08/26/2024  1:15 PM [facility]GENERAL 1 [facility]  09/02/2024  1:15 PM [facility]GENERAL 1 [facility]    Lynwood Solomons, MD

## 2024-04-03 ENCOUNTER — Ambulatory Visit: Payer: Self-pay | Attending: Obstetrics and Gynecology | Admitting: Obstetrics and Gynecology

## 2024-04-03 ENCOUNTER — Other Ambulatory Visit: Payer: Self-pay

## 2024-04-03 ENCOUNTER — Telehealth: Payer: Self-pay | Admitting: *Deleted

## 2024-04-03 VITALS — BP 106/58 | HR 75

## 2024-04-03 DIAGNOSIS — Z794 Long term (current) use of insulin: Secondary | ICD-10-CM | POA: Insufficient documentation

## 2024-04-03 DIAGNOSIS — O43192 Other malformation of placenta, second trimester: Secondary | ICD-10-CM | POA: Insufficient documentation

## 2024-04-03 DIAGNOSIS — O30032 Twin pregnancy, monochorionic/diamniotic, second trimester: Secondary | ICD-10-CM | POA: Insufficient documentation

## 2024-04-03 DIAGNOSIS — E119 Type 2 diabetes mellitus without complications: Secondary | ICD-10-CM

## 2024-04-03 DIAGNOSIS — O24112 Pre-existing diabetes mellitus, type 2, in pregnancy, second trimester: Secondary | ICD-10-CM | POA: Insufficient documentation

## 2024-04-03 DIAGNOSIS — O99212 Obesity complicating pregnancy, second trimester: Secondary | ICD-10-CM

## 2024-04-03 DIAGNOSIS — O30012 Twin pregnancy, monochorionic/monoamniotic, second trimester: Secondary | ICD-10-CM

## 2024-04-03 DIAGNOSIS — O321XX2 Maternal care for breech presentation, fetus 2: Secondary | ICD-10-CM | POA: Insufficient documentation

## 2024-04-03 DIAGNOSIS — E669 Obesity, unspecified: Secondary | ICD-10-CM

## 2024-04-03 DIAGNOSIS — O09522 Supervision of elderly multigravida, second trimester: Secondary | ICD-10-CM | POA: Insufficient documentation

## 2024-04-03 DIAGNOSIS — O321XX1 Maternal care for breech presentation, fetus 1: Secondary | ICD-10-CM | POA: Insufficient documentation

## 2024-04-03 DIAGNOSIS — Z3A18 18 weeks gestation of pregnancy: Secondary | ICD-10-CM

## 2024-04-03 DIAGNOSIS — O09523 Supervision of elderly multigravida, third trimester: Secondary | ICD-10-CM | POA: Insufficient documentation

## 2024-04-03 DIAGNOSIS — O30011 Twin pregnancy, monochorionic/monoamniotic, first trimester: Secondary | ICD-10-CM

## 2024-04-03 DIAGNOSIS — O99213 Obesity complicating pregnancy, third trimester: Secondary | ICD-10-CM | POA: Insufficient documentation

## 2024-04-03 NOTE — Progress Notes (Signed)
 Maternal-Fetal Medicine Consultation  Name: Pamela Alvarado  MRN: [id number]  GA: [id number] [redacted]w[redacted]d   Patient is here for limited ultrasound and Doppler studies to rule out twin-to-twin transfusion syndrome.  I counseled the patient with the help of Spanish language interpreter present in the room.  Her high-risk problems include: -Monochorionic-diamniotic twin pregnancy.  Natural conception.  Marginal cord insertion was seen in twin A. -Type 2 diabetes.  Patient reports she takes insulin  NPH 10 units in the morning and 10 units at night, and Humalog  04/11/09 units with meals.  She reports her fasting levels are below 95 mg/dL postprandial levels range between 120-125 mg/dL.  Recent hemoglobin A1c was 9.1%. -Advanced maternal age.  On cell-free fetal DNA screening, the risks of fetal aneuploidies are not increased.  Female fetuses were predicted.  Ultrasound Twin A: Lower fetus, maternal right, breech presentation, anterior placenta.  Marginal cord insertion is seen.  Amniotic fluid normal good fetal activity seen.  Fetal bladder is seen well. Umbilical artery Doppler study showed no evidence of absent end-diastolic flow.  Twin B: Upper fetus, maternal left, breech presentation, anterior placenta, female fetus.  Female fetus. Amniotic fluid normal good fetal activity seen.  Fetal bladder is seen well. Umbilical artery Doppler study showed no evidence of absent end-diastolic flow.  No evidence of twin-twin-transfusion syndrome (TTTS).  I reassured the patient of her ultrasound findings.  Monochorionic-diamniotic twin pregnancy We discussed unique complications of monochorionic twin pregnancy including TTTS.  Preterm delivery is more common with twin pregnancies.  I discussed ultrasound protocol of monitoring the fetuses every 2 weeks and weekly antenatal testing from [redacted] weeks gestation until delivery. We recommend delivery at [redacted] weeks gestation to reduce the likelihood of stillbirth.  Type 2  diabetes I encouraged the patient to check her blood glucose regularly.  I recommended that she take her insulin  bottles to her provider at her next visit to confirm if she is taking the correct dosage.  Discussed the importance of good blood glucose control to prevent fetal and neonatal adverse outcomes. I recommended fetal echocardiography.  Advanced maternal age I discussed the significance of cell-free fetal DNA screening that has a greater detection rate for Down syndrome.  Patient had opted not to have amniocentesis.  Recommendations -Patient has an appointment for fetal anatomy scan next week. -We have requested an appointment for fetal echocardiography [facility] [place]).     Consultation including face-to-face (more than 50%) counseling 30 minutes.

## 2024-04-03 NOTE — Telephone Encounter (Signed)
 Requested Interpreter Eda Royal call patient to offer [facility] prenatal care Spanish group. Did not reach patient. Rock Skip PEAK

## 2024-04-09 ENCOUNTER — Ambulatory Visit: Payer: Self-pay

## 2024-04-09 ENCOUNTER — Ambulatory Visit: Payer: Self-pay | Admitting: Obstetrics and Gynecology

## 2024-04-09 VITALS — BP 103/64

## 2024-04-09 DIAGNOSIS — O09522 Supervision of elderly multigravida, second trimester: Secondary | ICD-10-CM

## 2024-04-09 DIAGNOSIS — Z794 Long term (current) use of insulin: Secondary | ICD-10-CM

## 2024-04-09 DIAGNOSIS — O99212 Obesity complicating pregnancy, second trimester: Secondary | ICD-10-CM

## 2024-04-09 DIAGNOSIS — O24112 Pre-existing diabetes mellitus, type 2, in pregnancy, second trimester: Secondary | ICD-10-CM

## 2024-04-09 DIAGNOSIS — Z7984 Long term (current) use of oral hypoglycemic drugs: Secondary | ICD-10-CM | POA: Insufficient documentation

## 2024-04-09 DIAGNOSIS — O0990 Supervision of high risk pregnancy, unspecified, unspecified trimester: Secondary | ICD-10-CM

## 2024-04-09 DIAGNOSIS — Z3A19 19 weeks gestation of pregnancy: Secondary | ICD-10-CM | POA: Insufficient documentation

## 2024-04-09 DIAGNOSIS — E669 Obesity, unspecified: Secondary | ICD-10-CM

## 2024-04-09 DIAGNOSIS — E119 Type 2 diabetes mellitus without complications: Secondary | ICD-10-CM

## 2024-04-09 DIAGNOSIS — O43192 Other malformation of placenta, second trimester: Secondary | ICD-10-CM | POA: Insufficient documentation

## 2024-04-09 DIAGNOSIS — Z363 Encounter for antenatal screening for malformations: Secondary | ICD-10-CM | POA: Insufficient documentation

## 2024-04-09 DIAGNOSIS — O30032 Twin pregnancy, monochorionic/diamniotic, second trimester: Secondary | ICD-10-CM | POA: Insufficient documentation

## 2024-04-09 DIAGNOSIS — O30012 Twin pregnancy, monochorionic/monoamniotic, second trimester: Secondary | ICD-10-CM

## 2024-04-09 DIAGNOSIS — O30011 Twin pregnancy, monochorionic/monoamniotic, first trimester: Secondary | ICD-10-CM

## 2024-04-09 NOTE — Progress Notes (Signed)
 Maternal-Fetal Medicine Consultation  Name: Pamela Alvarado  MRN: [id number]  GA: [id number] [redacted]w[redacted]d   Monochorionic-diamniotic twin pregnancy.  Patient is here for fetal anatomical survey.  See previous note on consultation on twin pregnancy.  Ultrasound Twin A: Maternal right, cephalic presentation, anterior placenta, female fetus. Normal fetal growth and amniotic fluid. No markers of aneuploidies or obvious fetal structural defects are seen. Placental cord insertion is normal. Fetal bladder is seen well.  Umbilical artery Doppler did not show absent-or reversed-end-diastolic flow. Twin B: Maternal left, cephalic presentation, anterior placenta, female fetus. Normal fetal growth and amniotic fluid. No markers of aneuploidies or obvious fetal structural defects are seen. Placental cord insertion is normal. Fetal bladder is seen well.  Umbilical artery Doppler did not show absent-or reversed-end-diastolic flow. No evidence of twin-to-twin transfusion syndrome.  Type 2 diabetes Patient takes insulin  glargine (Basaglar ) 10 units in the morning and the 10 units at night.  She takes Humalog  06/14/11 units with meals.  Patient takes metformin  1,000 mg twice daily.  She reports her fasting levels are around 85 mg/dL and postprandial levels below 130 mg/dL. Patient reports dizziness if the blood glucose goes below 80 to 85 mg/dL. I encouraged her to continue her insulin  regimen and metformin .  I discussed hypoglycemia and corrective measures that she should carry some glucose tablets or snacks to combat hypoglycemia.  Patient should aim to bring the blood glucose levels above 85 mg/dL before driving. She has an appointment for fetal echocardiography. I reassured the patient of today's ultrasound findings.  Her hemoglobin A1c around conception was 9.8%, which is associated with an increased risk of congenital malformation of 2 7% (as opposed to 2% in general population).  I discussed the  limitations of ultrasound in detecting fetal anomalies. Patient is aware of frequent ultrasound monitoring for TTTS surveillance.  Recommendations - Ultrasound and Doppler evaluations every 2 weeks.   Consultation including face-to-face (more than 50%) counseling 20 minutes.

## 2024-04-11 ENCOUNTER — Other Ambulatory Visit: Payer: Self-pay

## 2024-04-23 ENCOUNTER — Other Ambulatory Visit: Payer: Self-pay

## 2024-04-23 ENCOUNTER — Ambulatory Visit: Payer: Self-pay | Admitting: Family Medicine

## 2024-04-23 VITALS — BP 116/77 | HR 84 | Wt 167.0 lb

## 2024-04-23 DIAGNOSIS — O99210 Obesity complicating pregnancy, unspecified trimester: Secondary | ICD-10-CM

## 2024-04-23 DIAGNOSIS — O24112 Pre-existing diabetes mellitus, type 2, in pregnancy, second trimester: Secondary | ICD-10-CM

## 2024-04-23 DIAGNOSIS — Z603 Acculturation difficulty: Secondary | ICD-10-CM

## 2024-04-23 DIAGNOSIS — O99212 Obesity complicating pregnancy, second trimester: Secondary | ICD-10-CM

## 2024-04-23 DIAGNOSIS — O0990 Supervision of high risk pregnancy, unspecified, unspecified trimester: Secondary | ICD-10-CM

## 2024-04-23 DIAGNOSIS — O0992 Supervision of high risk pregnancy, unspecified, second trimester: Secondary | ICD-10-CM

## 2024-04-23 DIAGNOSIS — Z758 Other problems related to medical facilities and other health care: Secondary | ICD-10-CM

## 2024-04-23 DIAGNOSIS — O30032 Twin pregnancy, monochorionic/diamniotic, second trimester: Secondary | ICD-10-CM

## 2024-04-23 DIAGNOSIS — Z3A21 21 weeks gestation of pregnancy: Secondary | ICD-10-CM

## 2024-04-23 DIAGNOSIS — O09522 Supervision of elderly multigravida, second trimester: Secondary | ICD-10-CM

## 2024-04-25 NOTE — Progress Notes (Signed)
   PRENATAL VISIT NOTE  Subjective:  Anuradha Chabot is a 38 y.o. [phone] at [redacted]w[redacted]d being seen today for ongoing prenatal care.  She is currently monitored for the following issues for this high-risk pregnancy and has Supervision of high risk pregnancy, antepartum; Type 2 diabetes mellitus affecting pregnancy in second trimester, antepartum; Monochorionic diamniotic twin gestation in second trimester; Language barrier; AMA (advanced maternal age) multigravida 35+; and Obesity affecting pregnancy, antepartum on their problem list.  Patient reports no bleeding, no contractions, no cramping, and no leaking.  Contractions: Not present. Vag. Bleeding: None.  Movement: Present. Denies leaking of fluid.   The following portions of the patient's history were reviewed and updated as appropriate: allergies, current medications, past family history, past medical history, past social history, past surgical history and problem list.   Objective:    Vitals:   04/23/24 1624  BP: 116/77  Pulse: 84  Weight: 167 lb (75.8 kg)    Fetal Status:      Movement: Present    General: Alert, oriented and cooperative. Patient is in no acute distress.  Skin: Skin is warm and dry. No rash noted.   Cardiovascular: Normal heart rate noted  Respiratory: Normal respiratory effort, no problems with respiration noted  Abdomen: Soft, gravid, appropriate for gestational age.  Pain/Pressure: Absent     Pelvic: Cervical exam deferred        Extremities: Normal range of motion.  Edema: None  Mental Status: Normal mood and affect. Normal behavior. Normal judgment and thought content.   Assessment and Plan:  Pregnancy: [id number] at [redacted]w[redacted]d 1. Supervision of high risk pregnancy, antepartum FHR by fetuses appropriate BP appropriate  2. Language barrier Spanish interpreter used throughout the visit  3. Multigravida of advanced maternal age in second trimester  4. Obesity affecting pregnancy, antepartum,  unspecified obesity type  5. Type 2 diabetes mellitus affecting pregnancy in second trimester, antepartum Currently on 10 units of NPH twice daily and 12 units Humalog  3 times daily with meals. Blood glucose is improved from previous check but not at goal.  Greater than 50% of blood glucose is over goal. Increase NPH to 12 units twice daily and increase Humalog  to 14 units 3 times daily with meals.  6. 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 [redacted] weeks gestation of pregnancy (Primary)   Preterm labor symptoms and general obstetric precautions including but not limited to vaginal bleeding, contractions, leaking of fluid and fetal movement were reviewed in detail with the patient. Please refer to After Visit Summary for other counseling recommendations.   No follow-ups on file.  Future Appointments  Date Time Provider Department Center  04/29/2024 10:15 AM [facility] PROVIDER 1 [facility]  04/29/2024 10:30 AM [facility] US4 [facility]  05/20/2024 11:15 AM Fredirick Glenys RAMAN, MD [facility]  05/27/2024 10:15 AM [facility] PROVIDER 1 [facility]  05/27/2024 10:30 AM [facility] US3 [facility]  06/10/2024 11:15 AM Anyanwu, Gloris LABOR, MD [facility]  06/24/2024  2:35 PM [facility] 1 [facility]  07/08/2024  1:15 PM [facility] 1 [facility]  07/22/2024  1:15 PM [facility]AL 1 [facility]  08/05/2024  1:15 PM [facility]AL 1 [facility]  08/12/2024  1:15 PM [facility]GENERAL 1 [facility]  08/19/2024  1:15 PM [facility]GENERAL 1 [facility]  08/26/2024  1:15 PM [facility]AL 1 [facility]  09/02/2024  1:15 PM [facility]AL 1 [facility]    Norleen LULLA Rover, MD

## 2024-04-29 ENCOUNTER — Other Ambulatory Visit: Payer: Self-pay

## 2024-04-29 ENCOUNTER — Ambulatory Visit: Payer: Self-pay | Attending: Internal Medicine

## 2024-05-07 ENCOUNTER — Other Ambulatory Visit: Payer: Self-pay

## 2024-05-13 ENCOUNTER — Other Ambulatory Visit: Payer: Self-pay

## 2024-05-13 ENCOUNTER — Ambulatory Visit: Payer: Self-pay

## 2024-05-20 ENCOUNTER — Ambulatory Visit: Payer: Self-pay | Admitting: Family Medicine

## 2024-05-20 ENCOUNTER — Other Ambulatory Visit: Payer: Self-pay

## 2024-05-20 VITALS — BP 106/71 | HR 80 | Wt 167.3 lb

## 2024-05-20 DIAGNOSIS — O0992 Supervision of high risk pregnancy, unspecified, second trimester: Secondary | ICD-10-CM

## 2024-05-20 DIAGNOSIS — Z3A25 25 weeks gestation of pregnancy: Secondary | ICD-10-CM

## 2024-05-20 DIAGNOSIS — O24112 Pre-existing diabetes mellitus, type 2, in pregnancy, second trimester: Secondary | ICD-10-CM

## 2024-05-20 DIAGNOSIS — O0990 Supervision of high risk pregnancy, unspecified, unspecified trimester: Secondary | ICD-10-CM

## 2024-05-20 DIAGNOSIS — Z758 Other problems related to medical facilities and other health care: Secondary | ICD-10-CM

## 2024-05-20 DIAGNOSIS — Z603 Acculturation difficulty: Secondary | ICD-10-CM

## 2024-05-20 DIAGNOSIS — O30032 Twin pregnancy, monochorionic/diamniotic, second trimester: Secondary | ICD-10-CM

## 2024-05-20 DIAGNOSIS — O09522 Supervision of elderly multigravida, second trimester: Secondary | ICD-10-CM

## 2024-05-20 DIAGNOSIS — Z23 Encounter for immunization: Secondary | ICD-10-CM

## 2024-05-20 NOTE — Progress Notes (Signed)
 PRENATAL VISIT NOTE  Subjective:  Pamela Alvarado is a 38 y.o. [phone] at [redacted]w[redacted]d being seen today for ongoing prenatal care.  She is currently monitored for the following issues for this high-risk pregnancy and has Supervision of high risk pregnancy, antepartum; Type 2 diabetes mellitus affecting pregnancy in second trimester, antepartum; Monochorionic diamniotic twin gestation in second trimester; Language barrier; AMA (advanced maternal age) multigravida 35+; and Obesity affecting pregnancy, antepartum on their problem list.  Patient reports no complaints.  Contractions: Not present. Vag. Bleeding: None.  Movement: Present. Denies leaking of fluid.   The following portions of the patient's history were reviewed and updated as appropriate: allergies, current medications, past family history, past medical history, past social history, past surgical history and problem list.   Objective:   Vitals:   05/20/24 1148  BP: 106/71  Pulse: 80  Weight: 167 lb 4.8 oz (75.9 kg)    Fetal Status:  Fetal Heart Rate (bpm): 145,155   Movement: Present    General: Alert, oriented and cooperative. Patient is in no acute distress.  Skin: Skin is warm and dry. No rash noted.   Cardiovascular: Normal heart rate noted  Respiratory: Normal respiratory effort, no problems with respiration noted  Abdomen: Soft, gravid, appropriate for gestational age.  Pain/Pressure: Absent     Pelvic: Cervical exam deferred        Extremities: Normal range of motion.  Edema: None  Mental Status: Normal mood and affect. Normal behavior. Normal judgment and thought content.      02/14/2024   11:05 AM 09/06/2022    3:26 PM 04/12/2022   12:09 PM  Depression screen PHQ 2/9  Decreased Interest 0 0 1  Down, Depressed, Hopeless 0 0 1  PHQ - 2 Score 0 0 2  Altered sleeping 0  1  Tired, decreased energy 3  2  Change in appetite 3  2  Feeling bad or failure about yourself  0  1  Trouble concentrating 0  1  Moving  slowly or fidgety/restless 0  1  Suicidal thoughts 0  1  PHQ-9 Score 6   11   Difficult doing work/chores   Somewhat difficult     Data saved with a previous flowsheet row definition        02/14/2024   11:05 AM 04/12/2022   12:09 PM 03/14/2022    3:44 PM 12/07/2021   10:28 AM  GAD 7 : Generalized Anxiety Score  Nervous, Anxious, on Edge 0 1 1 0  Control/stop worrying 0 2 3 0  Worry too much - different things 0 2 2 0  Trouble relaxing 0 2 2 0  Restless 0 2 2 0  Easily annoyed or irritable 0 2 1 0  Afraid - awful might happen 0 2 1 0  Total GAD 7 Score 0 13 12 0  Anxiety Difficulty  Very difficult      Assessment and Plan:  Pregnancy: [id number] at [redacted]w[redacted]d 1. Supervision of high risk pregnancy, antepartum (Primary) Continue prenatal care. - Flu vaccine trivalent PF, 6mos and older(Flulaval,Afluria,Fluarix,Fluzone)  2. Type 2 diabetes mellitus affecting pregnancy in second trimester, antepartum See log    Continue metformin , insulin  Normal fetal ECHO x 2  3. Monochorionic diamniotic twin gestation in second trimester Has no scan since 04/09/2024--Has one next week  4. Language barrier Spanish interpreter: Pamela Alvarado used  5. Multigravida of advanced maternal age in second trimester LR NIPT x 2  6. [redacted] weeks gestation of pregnancy  Preterm labor symptoms and general obstetric precautions including but not limited to vaginal bleeding, contractions, leaking of fluid and fetal movement were reviewed in detail with the patient. Please refer to After Visit Summary for other counseling recommendations.   Return in 3 weeks (on 06/10/2024) for HRC, 28 wk labs.  Future Appointments  Date Time Provider Department Center  05/27/2024 10:15 AM [facility] PROVIDER 1 [facility]  05/27/2024 10:30 AM [facility] US3 [facility]  06/10/2024 11:15 AM Pamela Alvarado, Pamela Alvarado LABOR, MD [facility]  06/23/2024  2:35 PM Pamela Pamela GAILS, MD [facility]  07/08/2024  1:15 PM Pamela Harari, MD [facility]   07/22/2024  1:15 PM Pamela Moccasin, MD [facility]  08/05/2024  1:15 PM Pamela Harari, MD [facility]  08/12/2024  1:15 PM [facility]AL 1 [facility]  08/19/2024  1:15 PM [facility]GENERAL 1 [facility]  08/26/2024  1:15 PM [facility]GENERAL 1 [facility]  09/02/2024  1:15 PM [facility]AL 1 [facility]    Pamela Pamela Birk, MD

## 2024-05-27 ENCOUNTER — Other Ambulatory Visit: Payer: Self-pay | Admitting: *Deleted

## 2024-05-27 ENCOUNTER — Other Ambulatory Visit: Payer: Self-pay | Admitting: Maternal & Fetal Medicine

## 2024-05-27 ENCOUNTER — Ambulatory Visit: Payer: Self-pay | Attending: Maternal & Fetal Medicine | Admitting: Maternal & Fetal Medicine

## 2024-05-27 ENCOUNTER — Ambulatory Visit: Payer: Self-pay

## 2024-05-27 VITALS — BP 111/69

## 2024-05-27 DIAGNOSIS — O30032 Twin pregnancy, monochorionic/diamniotic, second trimester: Secondary | ICD-10-CM

## 2024-05-27 DIAGNOSIS — O09522 Supervision of elderly multigravida, second trimester: Secondary | ICD-10-CM

## 2024-05-27 DIAGNOSIS — O30012 Twin pregnancy, monochorionic/monoamniotic, second trimester: Secondary | ICD-10-CM

## 2024-05-27 DIAGNOSIS — O24112 Pre-existing diabetes mellitus, type 2, in pregnancy, second trimester: Secondary | ICD-10-CM

## 2024-05-27 DIAGNOSIS — Z363 Encounter for antenatal screening for malformations: Secondary | ICD-10-CM | POA: Insufficient documentation

## 2024-05-27 DIAGNOSIS — Z3A26 26 weeks gestation of pregnancy: Secondary | ICD-10-CM

## 2024-05-27 DIAGNOSIS — O43192 Other malformation of placenta, second trimester: Secondary | ICD-10-CM | POA: Insufficient documentation

## 2024-05-27 DIAGNOSIS — O99212 Obesity complicating pregnancy, second trimester: Secondary | ICD-10-CM | POA: Insufficient documentation

## 2024-05-27 DIAGNOSIS — E119 Type 2 diabetes mellitus without complications: Secondary | ICD-10-CM

## 2024-06-03 NOTE — Progress Notes (Signed)
 After review, MFM consult with provider is not indicated for today  Pamela Nathanel Pipe, MD 06/03/2024 1:52 PM  [facility]

## 2024-06-10 ENCOUNTER — Other Ambulatory Visit: Payer: Self-pay

## 2024-06-10 ENCOUNTER — Encounter: Payer: Self-pay | Admitting: Obstetrics & Gynecology

## 2024-06-10 ENCOUNTER — Ambulatory Visit: Payer: Self-pay | Admitting: Obstetrics & Gynecology

## 2024-06-10 VITALS — BP 109/72 | HR 82 | Wt 175.7 lb

## 2024-06-10 DIAGNOSIS — O0990 Supervision of high risk pregnancy, unspecified, unspecified trimester: Secondary | ICD-10-CM

## 2024-06-10 DIAGNOSIS — O99210 Obesity complicating pregnancy, unspecified trimester: Secondary | ICD-10-CM

## 2024-06-10 DIAGNOSIS — O99213 Obesity complicating pregnancy, third trimester: Secondary | ICD-10-CM

## 2024-06-10 DIAGNOSIS — O0993 Supervision of high risk pregnancy, unspecified, third trimester: Secondary | ICD-10-CM

## 2024-06-10 DIAGNOSIS — O30033 Twin pregnancy, monochorionic/diamniotic, third trimester: Secondary | ICD-10-CM

## 2024-06-10 DIAGNOSIS — Z23 Encounter for immunization: Secondary | ICD-10-CM

## 2024-06-10 DIAGNOSIS — M25559 Pain in unspecified hip: Secondary | ICD-10-CM

## 2024-06-10 DIAGNOSIS — Z8759 Personal history of other complications of pregnancy, childbirth and the puerperium: Secondary | ICD-10-CM | POA: Insufficient documentation

## 2024-06-10 DIAGNOSIS — Z3A28 28 weeks gestation of pregnancy: Secondary | ICD-10-CM

## 2024-06-10 DIAGNOSIS — O09523 Supervision of elderly multigravida, third trimester: Secondary | ICD-10-CM

## 2024-06-10 DIAGNOSIS — O24113 Pre-existing diabetes mellitus, type 2, in pregnancy, third trimester: Secondary | ICD-10-CM

## 2024-06-10 DIAGNOSIS — O26893 Other specified pregnancy related conditions, third trimester: Secondary | ICD-10-CM

## 2024-06-10 MED ORDER — CYCLOBENZAPRINE HCL 10 MG PO TABS
10.0000 mg | ORAL_TABLET | Freq: Three times a day (TID) | ORAL | 2 refills | Status: DC | PRN
  Filled 2024-06-10: qty 30, 10d supply, fill #0

## 2024-06-10 NOTE — Progress Notes (Signed)
 Patient is Spanish-speaking only, interpreter Agricultural Engineer Peguero Day) present for this encounter.  PRENATAL VISIT NOTE  Subjective:  Pamela Alvarado is a 38 y.o. [phone] at [redacted]w[redacted]d being seen today for ongoing prenatal care.  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  Patient reports hip pain and leg pain especially on her left, especially with activity. No swelling or feeling any firmness.  Contractions: Irritability. Vag. Bleeding: None.  Movement: Present. Denies leaking of fluid.   The following portions of the patient's history were reviewed and updated as appropriate: allergies, current medications, past family history, past medical history, past social history, past surgical history and problem list.   Objective:   Vitals:   06/10/24 1131  BP: 109/72  Pulse: 82  Weight: 175 lb 11.2 oz (79.7 kg)    Fetal Status:  Fetal Heart Rate (bpm): 155/145   Movement: Present    General: Alert, oriented and cooperative. Patient is in no acute distress.  Skin: Skin is warm and dry. No rash noted.   Cardiovascular: Normal heart rate noted  Respiratory: Normal respiratory effort, no problems with respiration noted  Abdomen: Soft, gravid, appropriate for gestational age.  Pain/Pressure: Present     Pelvic: Cervical exam deferred        Extremities: Normal range of motion.  Edema: None. No cords, calf tenderness, erythema  Mental Status: Normal mood and affect. Normal behavior. Normal judgment and thought content.      02/14/2024   11:05 AM 09/06/2022    3:26 PM 04/12/2022   12:09 PM   Depression screen PHQ 2/9  Decreased Interest 0 0 1  Down, Depressed, Hopeless 0 0 1  PHQ - 2 Score 0 0 2  Altered sleeping 0  1  Tired, decreased energy 3  2  Change in appetite 3  2  Feeling bad or failure about yourself  0  1  Trouble concentrating 0  1  Moving slowly or fidgety/restless 0  1  Suicidal thoughts 0  1  PHQ-9 Score 6   11   Difficult doing work/chores   Somewhat difficult     Data saved with a previous flowsheet row definition        02/14/2024   11:05 AM 04/12/2022   12:09 PM 03/14/2022    3:44 PM 12/07/2021   10:28 AM  GAD 7 : Generalized Anxiety Score  Nervous, Anxious, on Edge 0 1 1 0  Control/stop worrying 0 2 3 0  Worry too much - different things 0 2 2 0  Trouble relaxing 0 2 2 0  Restless 0 2 2 0  Easily annoyed or irritable 0 2 1 0  Afraid - awful might happen 0 2 1 0  Total GAD 7 Score 0 13 12 0  Anxiety Difficulty  Very difficult     US  MFM UA CORD DOPPLER Result Date: 05/28/2024 ----------------------------------------------------------------------  OBSTETRICS REPORT                    (Corrected Final 05/28/2024 04:44 pm) ---------------------------------------------------------------------- Patient Info  ID #:       [id number]  D.O.B.:  August 28, 1985 (38 yrs)(F)  Name:       Pamela Alvarado              Visit Date: 05/27/2024 11:45 am              SIX[place] ---------------------------------------------------------------------- Performed By  Attending:        Nathanel Fetters      Ref. Address:      [address]                    [place]                                                              [place], [place]                                                              [address]  Performed By:     Meade Parker       [facility]  Referred By:      [facility] ---------------------------------------------------------------------- Orders  #  Description                           Alvarado        Ordered By  1  US  MFM OB FOLLOW UP                   76816.01    BURK SCHAIBLE  2  US  MFM UA CORD DOPPLER                76820.02    BURK SCHAIBLE  3  US  MFM UA ADDL GEST                   76820.01    BURK  SCHAIBLE  4  US  MFM OB FOLLOW UP ADDL              [id number]    CORENTHIAN     GEST                                              BOOKER  5  US  MFM MCA DOPPLER                    76821.01    BURK SCHAIBLE  6  US  MFM MCA ADDL GEST                  23178.8     BURK SCHAIBLE ----------------------------------------------------------------------  #  Order #                     Accession #                Episode #  1  [id number]                   [id number]                 [id number]  2  [id number]                   [id number]                 [id number]  3  [id number]                   [id number]                 [id number]  4  [id number]                   [id number]                 [id number]  5  [id number]                   [id number]                 [id number]  6  [id number]                   [id number]                 [id number] ---------------------------------------------------------------------- Indications  [redacted] weeks gestation of pregnancy                 Z3A.26  Twin pregnancy, mono/di, second trimester       O30.032  Advanced maternal age multigravida 52+,         O61.522  second trimester (38 yo)  Obesity complicating pregnancy, second          O99.212  trimester (PG BMI 34)  Pre-existing diabetes, type 2, in pregnancy,    O24.112  second trimester (insulin )  Marginal insertion of umbilical cord affecting  O43.192  management of mother in second trimester  (twin A)  LR female x2 - Neg Horizon/ AFP  Encounter for antenatal screening for           Z36.3  malformations  ---------------------------------------------------------------------- Fetal Evaluation (Fetus A)  Num Of Fetuses:          2  Fetal Heart Rate(bpm):   146  Cardiac Activity:  Observed  Fetal Lie:               Maternal right side  Presentation:            Cephalic  Placenta:                Anterior  P. Cord Insertion:       Marg insertion previously seen  Membrane Desc:      Dividing Membrane seen  Amniotic Fluid  AFI FV:      Within normal limits                              Largest Pocket(cm)                              7.74 ---------------------------------------------------------------------- Biometry (Fetus A)  BPD:      66.2  mm     G. Age:  26w 5d         54  %    CI:        74.84   %    70 - 86                                                          FL/HC:       19.1  %    18.6 - 20.4  HC:      242.8  mm     G. Age:  26w 3d         27  %    HC/AC:       1.11       1.04 - 1.22  AC:      218.6  mm     G. Age:  26w 2d         42  %    FL/BPD:      69.9  %    71 - 87  FL:       46.3  mm     G. Age:  25w 3d         13  %    FL/AC:       21.2  %    20 - 24  LV:        5.4  mm  Est. FW:     881   gm    1 lb 15 oz     28  %     FW Discordancy      0 \ 9 % ---------------------------------------------------------------------- OB History  Gravidity:    6         Term:   3         SAB:   2  Living:       3 ---------------------------------------------------------------------- Gestational Age (Fetus A)  LMP:           30w 5d        Date:  10/25/23                  EDD:   07/31/24  U/S Today:     26w 2d  EDD:   08/31/24  Best:          nunzio 2d     Det. By:  Oris Rater         EDD:   08/31/24                                      (01/14/24) ---------------------------------------------------------------------- Targeted Anatomy (Fetus A)  Central Nervous System  Calvarium/Cranial V.:  Appears normal         Cereb./Vermis:          Previously seen  Cavum:                  Previously seen        Sales Executive:         Previously seen  Lateral Ventricles:    Previously seen        Midline Falx:           Previously seen  Choroid Plexus:        Previously seen  Spine  Cervical:              Appears normal         Sacral:                 Appears normal  Thoracic:              Appears normal         Shape/Curvature:        Appears normal  Lumbar:                Appears normal  Head/Neck  Lips:                  Appears normal         Profile:                Appears normal  Neck:                  Appears normal         Orbits/Eyes:            Appears normal  Nuchal Fold:           Appears normal         Mandible:               Appears normal  Nasal Bone:            Present                Maxilla:                Appears normal  Thorax  4 Chamber View:        Appears normal         Interventr. Septum:     Appears normal  Cardiac Rhythm:        Normal                 Cardiac Axis:           Normal  Cardiac Situs:         Appears normal         Diaphragm:              Appears normal  Rt Outflow Tract:      Previously seen        3 Vessel  View:          Previously seen  Lt Outflow Tract:      Previously seen        3 V Trachea View:       Previously seen  Aortic Arch:           Appears normal         IVC:                    Previously seen  Ductal Arch:           Previously seen        Crossing:               Previously seen  SVC:                   Appears normal  Abdomen  Ventral Wall:          Appears normal         Lt Kidney:              Appears normal  Cord Insertion:        Appears normal         Rt Kidney:              Appears normal  Situs:                 Appears normal         Bladder:                Appears normal  Stomach:               Appears normal  Extremities  Lt Humerus:            Previously seen        Lt Femur:               Previously seen  Rt Humerus:            Previously seen        Rt Femur:               Previously seen  Lt Forearm:            Previously seen         Lt Lower Leg:           Previously seen  Rt Forearm:            Previously seen        Rt Lower Leg:           Previously seen  Lt Hand:               Previously seen        Lt Foot:                Previously seen  Rt Hand:               Previously seen        Rt Foot:                Previously seen  Other  Umbilical Cord:        Normal 3-vessel        Genitalia:              Female-nml  Comment:     Technically difficult due to fetal position. ---------------------------------------------------------------------- Doppler - Fetal Vessels (Fetus A)  Umbilical Artery  S/D    %tile      RI    %tile      PI    %tile     PSV    ADFV    RDFV                                                     (cm/s)   3.71       75    0.73       76    1.24       82    43.85      No      No  Middle Cerebral Artery    S/D               RI               PI    %tile     PSV    MoM                                                     (cm/s)   3.71             0.73             1.24    < 2.5    27.39   < 1  Cerebroplacental Ratio                         MCA PI / UA PI     %tile                                       1    < 2.5 ---------------------------------------------------------------------- Fetal Evaluation (Fetus B)  Num Of Fetuses:          2  Cardiac Activity:        Observed  Fetal Lie:               Maternal left side  Presentation:            Cephalic  Placenta:                Anterior  Amniotic Fluid  AFI FV:      Within normal limits                              Largest Pocket(cm)                              7.22 ---------------------------------------------------------------------- Biometry (Fetus B)  BPD:      63.6  mm     G. Age:  25w 5d         22  %    CI:        73.61   %    70 - 86  FL/HC:       19.8  %    18.6 - 20.4  HC:      235.5  mm     G. Age:  25w 4d          9  %    HC/AC:       1.14       1.04 - 1.22  AC:      206.4  mm     G. Age:  25w 2d         13  %     FL/BPD:      73.3  %    71 - 87  FL:       46.6  mm     G. Age:  25w 4d         16  %    FL/AC:       22.6  %    20 - 24  Est. FW:     804   gm    1 lb 12 oz     11  %     FW Discordancy         9  % ---------------------------------------------------------------------- Gestational Age (Fetus B)  LMP:           30w 5d        Date:  10/25/23                  EDD:   07/31/24  U/S Today:     25w 4d                                        EDD:   09/05/24  Best:          26w 2d     Det. By:  Early Ultrasound         EDD:   08/31/24                                      (01/14/24) ---------------------------------------------------------------------- Targeted Anatomy (Fetus B)  Central Nervous System  Calvarium/Cranial V.:  Appears normal         Cereb./Vermis:          Previously seen  Cavum:                 Appears normal         Cisterna Magna:         Previously seen  Lateral Ventricles:    Previously seen        Midline Falx:           Appears normal  Choroid Plexus:        Previously seen  Spine  Cervical:              Previously seen        Sacral:                 Previously seen  Thoracic:              Previously seen        Shape/Curvature:        Previously seen  Lumbar:                Previously seen  Head/Neck  Lips:  Appears normal         Profile:                Previously seen  Neck:                  Appears normal         Orbits/Eyes:            Previously seen  Nuchal Fold:           Previously seen        Mandible:               Previously seen  Nasal Bone:            Present                Maxilla:                Previously seen  Thorax  4 Chamber View:        Appears normal         Interventr. Septum:     Previously seen  Cardiac Rhythm:        Normal                 Cardiac Axis:           Normal  Cardiac Situs:         Appears normal         Diaphragm:              Appears normal  Rt Outflow Tract:      Previously seen        3 Vessel View:          Previously seen  Lt Outflow Tract:       Previously seen        3 V Trachea View:       Previously seen  Aortic Arch:           Previously seen        IVC:                    Previously seen  Ductal Arch:           Previously seen        Crossing:               Appears normal  SVC:                   Previously seen  Abdomen  Ventral Wall:          Appears normal         Lt Kidney:              Appears normal  Cord Insertion:        Appears normal         Rt Kidney:              Appears normal  Situs:                 Appears normal         Bladder:                Appears normal  Stomach:               Appears normal  Extremities  Lt Humerus:            Previously seen  Lt Femur:               Previously seen  Rt Humerus:            Previously seen        Rt Femur:               Previously seen  Lt Forearm:            Previously seen        Lt Lower Leg:           Previously seen  Rt Forearm:            Previously seen        Rt Lower Leg:           Previously seen  Lt Hand:               Previously seen        Lt Foot:                Previously seen  Rt Hand:               Previously seen        Rt Foot:                Previously seen  Other  Umbilical Cord:        Normal 3-vessel        Genitalia:              Female-nml ---------------------------------------------------------------------- Doppler - Fetal Vessels (Fetus B)  Umbilical Artery    S/D    %tile      RI    %tile      PI    %tile     PSV    ADFV    RDFV                                                     (cm/s)   4.34       91    0.77       88    1.33       91    39.61      No      No  Middle Cerebral Artery                                                       PSV    MoM                                                     (cm/s)                                                      38.58   1.15 ---------------------------------------------------------------------- Cervix Uterus Adnexa  Cervix  Not visualized (advanced GA >24wks)  Uterus  No abnormality visualized.  Right Ovary  Not  visualized.  Left Ovary  Not visualized.  Cul De Sac  No free fluid seen.  Adnexa  No abnormality visualized ---------------------------------------------------------------------- Impression  Follow up growth for monochorionic diamnoitic twin pregnancy  Twin A normal stomach, amniotic fluid, and bladder -  Maternal left, Cephalic EFW: 28%  Twin B normal stomach, amniotic fluid, and bladder  -  Maternal right, Cephalic EFW 11%  Twin discordance is 9%  UA and MCA  Dopplers are normal. ---------------------------------------------------------------------- Recommendations  Continue q2 week TTTS and TAPS surviellance  Growth scheduled in 4 weeks. ----------------------------------------------------------------------                    Nathanel Fetters, MD Electronically Signed Corrected Final Report  05/28/2024 04:44 pm ----------------------------------------------------------------------   US  MFM UA ADDL GEST Result Date: 05/28/2024 ----------------------------------------------------------------------  OBSTETRICS REPORT                    (Corrected Final 05/28/2024 04:44 pm) ---------------------------------------------------------------------- Patient Info  ID #:       [id number]                          D.O.B.:  01/22/1986 (38 yrs)(F)  Name:       Pamela Alvarado              Visit Date: 05/27/2024 11:45 am              [place] ---------------------------------------------------------------------- Performed By  Attending:        Nathanel Fetters      Ref. Address:      [address]                    [place]                                                              [place], [place]                                                              [address]  Performed By:     Meade Parker       [facility]  Referred By:      [facility] ---------------------------------------------------------------------- Orders  #  Description                           Alvarado        Ordered By  1  US  MFM OB FOLLOW UP                   [id number]    BURK SCHAIBLE  2  US  MFM UA CORD DOPPLER                [id number]    BURK SCHAIBLE  3  US  MFM UA ADDL GEST                   76820.01    BURK SCHAIBLE  4  US  MFM OB FOLLOW UP ADDL              23183.97    CORENTHIAN     GEST                                              BOOKER  5  US  MFM MCA DOPPLER                    76821.01    BURK SCHAIBLE  6  US  MFM MCA ADDL GEST                  23178.8     BURK SCHAIBLE ----------------------------------------------------------------------  #  Order #                     Accession #                Episode #  1  [id number]                   [id number]                 [id number]  2  [id number]                   [id number]                 [id number]  3  [id number]                   [id number]                 [id number]  4  [id number]                   [id number]                 [id number]  5  [id number]                   [id number]                 [id number]  6  [id number]                   [id number]                 [id number] ---------------------------------------------------------------------- Indications  [redacted] weeks gestation  of pregnancy                 Z3A.26  Twin pregnancy, mono/di, second trimester       O87.032  Advanced maternal age multigravida 22+,         O18.522  second trimester (38 yo)  Obesity complicating pregnancy, second          O99.212  trimester (PG BMI 34)  Pre-existing diabetes, type 2, in pregnancy,    O24.112  second trimester (insulin )  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 Cord Insertion:       Marg insertion previously seen  Membrane Desc:      Dividing Membrane seen  Amniotic Fluid  AFI FV:      Within normal limits                              Largest Pocket(cm)                              7.74 ---------------------------------------------------------------------- Biometry (Fetus A)  BPD:      66.2  mm     G. Age:  26w 5d         54  %    CI:        74.84   %    70 - 86                                                          FL/HC:       19.1  %    18.6 - 20.4  HC:      242.8  mm     G. Age:  26w 3d         27  %    HC/AC:       1.11       1.04 - 1.22  AC:      218.6  mm     G. Age:  26w 2d         42  %    FL/BPD:      69.9  %    71 - 87  FL:       46.3  mm     G. Age:  25w 3d         13  %    FL/AC:       21.2  %    20 - 24  LV:        5.4  mm  Est. FW:     881   gm    1 lb 15 oz     28  %     FW Discordancy      0 \ 9 % ---------------------------------------------------------------------- OB History  Gravidity:    6         Term:   3         SAB:   2  Living:       3 ---------------------------------------------------------------------- Gestational Age (Fetus A)  LMP:           30w 5d        Date:  10/25/23                  EDD:   07/31/24  U/S Today:     26w 2d                                        EDD:   08/31/24  Best:          26w 2d     Det. By:  Oris Rater         EDD:   08/31/24                                      (01/14/24) ---------------------------------------------------------------------- Targeted Anatomy (Fetus A)  Central Nervous System  Calvarium/Cranial V.:  Appears normal         Cereb./Vermis:          Previously seen  Cavum:                  Previously seen        Sales Executive:         Previously seen  Lateral Ventricles:    Previously seen        Midline Falx:           Previously seen  Choroid Plexus:        Previously seen  Spine  Cervical:              Appears normal         Sacral:                 Appears normal  Thoracic:              Appears normal         Shape/Curvature:        Appears normal  Lumbar:                Appears normal  Head/Neck  Lips:                  Appears normal         Profile:                Appears normal  Neck:                  Appears normal         Orbits/Eyes:            Appears normal  Nuchal Fold:           Appears normal         Mandible:  Appears normal  Nasal Bone:            Present                Maxilla:                Appears normal  Thorax  4 Chamber View:        Appears normal         Interventr. Septum:     Appears normal  Cardiac Rhythm:        Normal                 Cardiac Axis:           Normal  Cardiac Situs:         Appears normal         Diaphragm:              Appears normal  Rt Outflow Tract:      Previously seen        3 Vessel View:          Previously seen  Lt Outflow Tract:      Previously seen        3 V Trachea View:       Previously seen  Aortic Arch:           Appears normal         IVC:                    Previously seen  Ductal Arch:           Previously seen        Crossing:               Previously seen  SVC:                   Appears normal  Abdomen  Ventral Wall:          Appears normal         Lt Kidney:              Appears normal  Cord Insertion:        Appears normal         Rt Kidney:              Appears normal  Situs:                 Appears normal         Bladder:                Appears normal  Stomach:               Appears normal  Extremities  Lt Humerus:            Previously seen        Lt Femur:               Previously seen  Rt Humerus:            Previously seen        Rt Femur:               Previously seen  Lt Forearm:            Previously seen         Lt Lower Leg:           Previously seen  Rt Forearm:  Previously seen        Rt Lower Leg:           Previously seen  Lt Hand:               Previously seen        Lt Foot:                Previously seen  Rt Hand:               Previously seen        Rt Foot:                Previously seen  Other  Umbilical Cord:        Normal 3-vessel        Genitalia:              Female-nml  Comment:     Technically difficult due to fetal position. ---------------------------------------------------------------------- Doppler - Fetal Vessels (Fetus A)  Umbilical Artery    S/D    %tile      RI    %tile      PI    %tile     PSV    ADFV    RDFV                                                     (cm/s)   3.71       75    0.73       76    1.24       82    43.85      No      No  Middle Cerebral Artery    S/D               RI               PI    %tile     PSV    MoM                                                     (cm/s)   3.71             0.73             1.24    < 2.5    27.39   < 1  Cerebroplacental Ratio                         MCA PI / UA PI     %tile                                       1    < 2.5 ---------------------------------------------------------------------- Fetal Evaluation (Fetus B)  Num Of Fetuses:          2  Cardiac Activity:        Observed  Fetal Lie:               Maternal left side  Presentation:  Cephalic  Placenta:                Anterior  Amniotic Fluid  AFI FV:      Within normal limits                              Largest Pocket(cm)                              7.22 ---------------------------------------------------------------------- Biometry (Fetus B)  BPD:      63.6  mm     G. Age:  25w 5d         22  %    CI:        73.61   %    70 - 86                                                          FL/HC:       19.8  %    18.6 - 20.4  HC:      235.5  mm     G. Age:  25w 4d          9  %    HC/AC:       1.14       1.04 - 1.22  AC:      206.4  mm     G. Age:  25w 2d         13  %     FL/BPD:      73.3  %    71 - 87  FL:       46.6  mm     G. Age:  25w 4d         16  %    FL/AC:       22.6  %    20 - 24  Est. FW:     804   gm    1 lb 12 oz     11  %     FW Discordancy         9  % ---------------------------------------------------------------------- Gestational Age (Fetus B)  LMP:           30w 5d        Date:  10/25/23                  EDD:   07/31/24  U/S Today:     25w 4d                                        EDD:   09/05/24  Best:          26w 2d     Det. By:  Early Ultrasound         EDD:   08/31/24                                      (01/14/24) ---------------------------------------------------------------------- Targeted Anatomy (Fetus B)  Central Nervous System  Calvarium/Cranial V.:  Appears normal         Cereb./Vermis:          Previously seen  Cavum:                 Appears normal         Cisterna Magna:         Previously seen  Lateral Ventricles:    Previously seen        Midline Falx:           Appears normal  Choroid Plexus:        Previously seen  Spine  Cervical:              Previously seen        Sacral:                 Previously seen  Thoracic:              Previously seen        Shape/Curvature:        Previously seen  Lumbar:                Previously seen  Head/Neck  Lips:                  Appears normal         Profile:                Previously seen  Neck:                  Appears normal         Orbits/Eyes:            Previously seen  Nuchal Fold:           Previously seen        Mandible:               Previously seen  Nasal Bone:            Present                Maxilla:                Previously seen  Thorax  4 Chamber View:        Appears normal         Interventr. Septum:     Previously seen  Cardiac Rhythm:        Normal                 Cardiac Axis:           Normal  Cardiac Situs:         Appears normal         Diaphragm:              Appears normal  Rt Outflow Tract:      Previously seen        3 Vessel View:          Previously seen  Lt Outflow Tract:       Previously seen        3 V Trachea View:       Previously seen  Aortic Arch:           Previously seen        IVC:                    Previously seen  Ductal Arch:  Previously seen        Crossing:               Appears normal  SVC:                   Previously seen  Abdomen  Ventral Wall:          Appears normal         Lt Kidney:              Appears normal  Cord Insertion:        Appears normal         Rt Kidney:              Appears normal  Situs:                 Appears normal         Bladder:                Appears normal  Stomach:               Appears normal  Extremities  Lt Humerus:            Previously seen        Lt Femur:               Previously seen  Rt Humerus:            Previously seen        Rt Femur:               Previously seen  Lt Forearm:            Previously seen        Lt Lower Leg:           Previously seen  Rt Forearm:            Previously seen        Rt Lower Leg:           Previously seen  Lt Hand:               Previously seen        Lt Foot:                Previously seen  Rt Hand:               Previously seen        Rt Foot:                Previously seen  Other  Umbilical Cord:        Normal 3-vessel        Genitalia:              Female-nml ---------------------------------------------------------------------- Doppler - Fetal Vessels (Fetus B)  Umbilical Artery    S/D    %tile      RI    %tile      PI    %tile     PSV    ADFV    RDFV                                                     (cm/s)   4.34       91    0.77  88    1.33       91    39.61      No      No  Middle Cerebral Artery                                                       PSV    MoM                                                     (cm/s)                                                      38.58   1.15 ---------------------------------------------------------------------- Cervix Uterus Adnexa  Cervix  Not visualized (advanced GA >24wks)  Uterus  No abnormality visualized.  Right Ovary  Not  visualized.  Left Ovary  Not visualized.  Cul De Sac  No free fluid seen.  Adnexa  No abnormality visualized ---------------------------------------------------------------------- Impression  Follow up growth for monochorionic diamnoitic twin pregnancy  Twin A normal stomach, amniotic fluid, and bladder -  Maternal left, Cephalic EFW: 28%  Twin B normal stomach, amniotic fluid, and bladder  -  Maternal right, Cephalic EFW 11%  Twin discordance is 9%  UA and MCA  Dopplers are normal. ---------------------------------------------------------------------- Recommendations  Continue q2 week TTTS and TAPS surviellance  Growth scheduled in 4 weeks. ----------------------------------------------------------------------                    Nathanel Fetters, MD Electronically Signed Corrected Final Report  05/28/2024 04:44 pm ----------------------------------------------------------------------   US  MFM OB FOLLOW UP Result Date: 05/28/2024 ----------------------------------------------------------------------  OBSTETRICS REPORT                    (Corrected Final 05/28/2024 04:44 pm) ---------------------------------------------------------------------- Patient Info  ID #:       [id number]                          D.O.B.:  04-Jun-1986 (38 yrs)(F)  Name:       Pamela Alvarado              Visit Date: 05/27/2024 11:45 am              [place] ---------------------------------------------------------------------- Performed By  Attending:        Nathanel Fetters      Ref. Address:      [address]                    [place]                                                              [place], [place]  [address]  Performed By:     Meade Parker       [facility]  Referred By:      [facility] ---------------------------------------------------------------------- Orders  #  Description                           Alvarado        Ordered By  1  US  MFM OB FOLLOW UP                   76816.01    BURK SCHAIBLE  2  US  MFM UA CORD DOPPLER                76820.02    BURK SCHAIBLE  3  US  MFM UA ADDL GEST                   76820.01    BURK SCHAIBLE  4  US  MFM OB FOLLOW UP ADDL              23183.97    CORENTHIAN     GEST                                              BOOKER  5  US  MFM MCA DOPPLER                    76821.01    BURK SCHAIBLE  6  US  MFM MCA ADDL GEST                  23178.8     BURK SCHAIBLE ----------------------------------------------------------------------  #  Order #                     Accession #                Episode #  1  [id number]                   [id number]  [id number]  2  [id number]                   [id number]                 [id number]  3  [id number]                   [id number]                 [id number]  4  [id number]                   [id number]                 [id number]  5  [id number]                   [id number]                 [id number]  6  [id number]                   [id number]                 [id number] ---------------------------------------------------------------------- Indications  [redacted] weeks gestation of pregnancy                 Z3A.26  Twin pregnancy, mono/di, second trimester       O30.032  Advanced maternal age multigravida 25+,         O31.522  second trimester (38 yo)  Obesity complicating pregnancy, second          O99.212  trimester (PG BMI 34)  Pre-existing diabetes, type 2, in pregnancy,    O24.112  second trimester (insulin )  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 Cord Insertion:       Marg insertion previously seen  Membrane Desc:      Dividing Membrane seen  Amniotic Fluid  AFI FV:      Within normal limits                              Largest Pocket(cm)                              7.74 ---------------------------------------------------------------------- Biometry (Fetus A)  BPD:      66.2  mm     G. Age:  26w 5d         54  %    CI:        74.84   %    70 - 86  FL/HC:       19.1  %    18.6 - 20.4  HC:      242.8  mm     G. Age:  26w 3d         27  %    HC/AC:       1.11       1.04 - 1.22  AC:      218.6  mm     G. Age:  26w 2d         42  %    FL/BPD:      69.9  %    71 - 87  FL:       46.3  mm     G. Age:  25w 3d         13  %    FL/AC:       21.2  %    20 - 24  LV:        5.4  mm  Est. FW:     881   gm    1 lb 15 oz     28  %     FW Discordancy      0 \ 9 % ---------------------------------------------------------------------- OB History  Gravidity:    6         Term:   3         SAB:   2  Living:       3 ---------------------------------------------------------------------- Gestational Age (Fetus A)  LMP:           30w 5d        Date:  10/25/23                  EDD:   07/31/24  U/S Today:     26w 2d                                        EDD:   08/31/24  Best:          26w 2d     Det. By:  Oris Rater         EDD:   08/31/24                                      (01/14/24) ---------------------------------------------------------------------- Targeted Anatomy (Fetus A)  Central Nervous System  Calvarium/Cranial V.:  Appears normal         Cereb./Vermis:          Previously seen  Cavum:                  Previously seen        [employer]:         Previously seen  Lateral Ventricles:    Previously seen        Midline Falx:           Previously seen  Choroid Plexus:        Previously seen  Spine  Cervical:              Appears normal         Sacral:                 Appears normal  Thoracic:  Appears normal         Shape/Curvature:        Appears normal  Lumbar:                Appears normal  Head/Neck  Lips:                  Appears normal         Profile:                Appears normal  Neck:                  Appears normal         Orbits/Eyes:            Appears normal  Nuchal Fold:           Appears normal         Mandible:               Appears normal  Nasal Bone:            Present                Maxilla:                Appears normal  Thorax  4 Chamber View:        Appears normal         Interventr. Septum:     Appears normal  Cardiac Rhythm:        Normal                 Cardiac Axis:           Normal  Cardiac Situs:         Appears normal         Diaphragm:              Appears normal  Rt Outflow Tract:      Previously seen        3 Vessel View:          Previously seen  Lt Outflow Tract:      Previously seen        3 V Trachea View:       Previously seen  Aortic Arch:           Appears normal         IVC:                    Previously seen  Ductal Arch:           Previously seen        Crossing:               Previously seen  SVC:                   Appears normal  Abdomen  Ventral Wall:          Appears normal         Lt Kidney:              Appears normal  Cord Insertion:        Appears normal         Rt Kidney:              Appears normal  Situs:                 Appears normal  Bladder:                Appears normal  Stomach:               Appears normal  Extremities  Lt Humerus:            Previously seen        Lt Femur:               Previously seen  Rt Humerus:            Previously seen        Rt Femur:               Previously seen  Lt Forearm:            Previously seen         Lt Lower Leg:           Previously seen  Rt Forearm:            Previously seen        Rt Lower Leg:           Previously seen  Lt Hand:               Previously seen        Lt Foot:                Previously seen  Rt Hand:               Previously seen        Rt Foot:                Previously seen  Other  Umbilical Cord:        Normal 3-vessel        Genitalia:              Female-nml  Comment:     Technically difficult due to fetal position. ---------------------------------------------------------------------- Doppler - Fetal Vessels (Fetus A)  Umbilical Artery    S/D    %tile      RI    %tile      PI    %tile     PSV    ADFV    RDFV                                                     (cm/s)   3.71       75    0.73       76    1.24       82    43.85      No      No  Middle Cerebral Artery    S/D               RI               PI    %tile     PSV    MoM                                                     (cm/s)   3.71  0.73             1.24    < 2.5    27.39   < 1  Cerebroplacental Ratio                         MCA PI / UA PI     %tile                                       1    < 2.5 ---------------------------------------------------------------------- Fetal Evaluation (Fetus B)  Num Of Fetuses:          2  Cardiac Activity:        Observed  Fetal Lie:               Maternal left side  Presentation:            Cephalic  Placenta:                Anterior  Amniotic Fluid  AFI FV:      Within normal limits                              Largest Pocket(cm)                              7.22 ---------------------------------------------------------------------- Biometry (Fetus B)  BPD:      63.6  mm     G. Age:  25w 5d         22  %    CI:        73.61   %    70 - 86                                                          FL/HC:       19.8  %    18.6 - 20.4  HC:      235.5  mm     G. Age:  25w 4d          9  %    HC/AC:       1.14       1.04 - 1.22  AC:      206.4  mm     G. Age:  25w 2d         13  %     FL/BPD:      73.3  %    71 - 87  FL:       46.6  mm     G. Age:  25w 4d         16  %    FL/AC:       22.6  %    20 - 24  Est. FW:     804   gm    1 lb 12 oz     11  %     FW Discordancy         9  % ---------------------------------------------------------------------- Gestational Age (Fetus B)  LMP:  30w 5d        Date:  10/25/23                  EDD:   07/31/24  U/S Today:     25w 4d                                        EDD:   09/05/24  Best:          26w 2d     Det. By:  Early Ultrasound         EDD:   08/31/24                                      (01/14/24) ---------------------------------------------------------------------- Targeted Anatomy (Fetus B)  Central Nervous System  Calvarium/Cranial V.:  Appears normal         Cereb./Vermis:          Previously seen  Cavum:                 Appears normal         Cisterna Magna:         Previously seen  Lateral Ventricles:    Previously seen        Midline Falx:           Appears normal  Choroid Plexus:        Previously seen  Spine  Cervical:              Previously seen        Sacral:                 Previously seen  Thoracic:              Previously seen        Shape/Curvature:        Previously seen  Lumbar:                Previously seen  Head/Neck  Lips:                  Appears normal         Profile:                Previously seen  Neck:                  Appears normal         Orbits/Eyes:            Previously seen  Nuchal Fold:           Previously seen        Mandible:               Previously seen  Nasal Bone:            Present                Maxilla:                Previously seen  Thorax  4 Chamber View:        Appears normal         Interventr. Septum:     Previously seen  Cardiac Rhythm:        Normal  Cardiac Axis:           Normal  Cardiac Situs:         Appears normal         Diaphragm:              Appears normal  Rt Outflow Tract:      Previously seen        3 Vessel View:          Previously seen  Lt Outflow Tract:       Previously seen        3 V Trachea View:       Previously seen  Aortic Arch:           Previously seen        IVC:                    Previously seen  Ductal Arch:           Previously seen        Crossing:               Appears normal  SVC:                   Previously seen  Abdomen  Ventral Wall:          Appears normal         Lt Kidney:              Appears normal  Cord Insertion:        Appears normal         Rt Kidney:              Appears normal  Situs:                 Appears normal         Bladder:                Appears normal  Stomach:               Appears normal  Extremities  Lt Humerus:            Previously seen        Lt Femur:               Previously seen  Rt Humerus:            Previously seen        Rt Femur:               Previously seen  Lt Forearm:            Previously seen        Lt Lower Leg:           Previously seen  Rt Forearm:            Previously seen        Rt Lower Leg:           Previously seen  Lt Hand:               Previously seen        Lt Foot:                Previously seen  Rt Hand:               Previously seen        Rt Foot:  Previously seen  Other  Umbilical Cord:        Normal 3-vessel        Genitalia:              Female-nml ---------------------------------------------------------------------- Doppler - Fetal Vessels (Fetus B)  Umbilical Artery    S/D    %tile      RI    %tile      PI    %tile     PSV    ADFV    RDFV                                                     (cm/s)   4.34       91    0.77       88    1.33       91    39.61      No      No  Middle Cerebral Artery                                                       PSV    MoM                                                     (cm/s)                                                      38.58   1.15 ---------------------------------------------------------------------- Cervix Uterus Adnexa  Cervix  Not visualized (advanced GA >24wks)  Uterus  No abnormality visualized.  Right Ovary  Not  visualized.  Left Ovary  Not visualized.  Cul De Sac  No free fluid seen.  Adnexa  No abnormality visualized ---------------------------------------------------------------------- Impression  Follow up growth for monochorionic diamnoitic twin pregnancy  Twin A normal stomach, amniotic fluid, and bladder -  Maternal left, Cephalic EFW: 28%  Twin B normal stomach, amniotic fluid, and bladder  -  Maternal right, Cephalic EFW 11%  Twin discordance is 9%  UA and MCA  Dopplers are normal. ---------------------------------------------------------------------- Recommendations  Continue q2 week TTTS and TAPS surviellance  Growth scheduled in 4 weeks. ----------------------------------------------------------------------                    Nathanel Fetters, MD Electronically Signed Corrected Final Report  05/28/2024 04:44 pm ----------------------------------------------------------------------   US  MFM OB FOLLOW UP ADDL GEST Result Date: 05/28/2024 ----------------------------------------------------------------------  OBSTETRICS REPORT                    (Corrected Final 05/28/2024 04:44 pm) ---------------------------------------------------------------------- Patient Info  ID #:       [id number]                          D.O.B.:  10/21/1985 (38 yrs)(F)  Name:  Pamela Alvarado              Visit Date: 05/27/2024 11:45 am              [place] ---------------------------------------------------------------------- Performed By  Attending:        Nathanel Fetters      Ref. Address:      [address]                    [place]                                                              [place], [place]                                                              [address]  Performed By:     Meade Parker       [facility]  Referred By:      [facility] ---------------------------------------------------------------------- Orders  #  Description                           Alvarado        Ordered By  1  US  MFM OB FOLLOW UP                   76816.01    BURK SCHAIBLE  2  US  MFM UA CORD DOPPLER                76820.02    BURK SCHAIBLE  3  US  MFM UA ADDL GEST                   76820.01    BURK SCHAIBLE  4  US  MFM OB FOLLOW UP ADDL  23183.97    CORENTHIAN     GEST                                              BOOKER  5  US  MFM MCA DOPPLER                    76821.01    BURK SCHAIBLE  6  US  MFM MCA ADDL GEST                  23178.8     BURK SCHAIBLE ----------------------------------------------------------------------  #  Order #                     Accession #                Episode #  1  [id number]                   [id number]                 [id number]  2  [id number]                   [id number]                 [id number]  3  [id number]                   [id number]                 [id number]  4  [id number]                   [id number]                 [id number]  5  [id number]                   [id number]                 [id number]  6  [id number]                   [id number]                 [id number] ---------------------------------------------------------------------- Indications  [redacted] weeks gestation of pregnancy                 Z3A.26  Twin pregnancy, mono/di, second trimester       O30.032  Advanced maternal age multigravida 63+,         O68.522  second trimester (38 yo)  Obesity complicating pregnancy, second          O99.212  trimester (PG BMI 34)  Pre-existing diabetes, type 2, in pregnancy,    O24.112  second trimester (insulin )  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 Cord Insertion:       Marg insertion previously seen  Membrane Desc:      Dividing Membrane seen  Amniotic Fluid  AFI FV:      Within normal limits                              Largest Pocket(cm)                              7.74 ---------------------------------------------------------------------- Biometry (Fetus A)  BPD:      66.2  mm     G. Age:  26w 5d         54  %    CI:        74.84   %    70 - 86                                                          FL/HC:       19.1  %    18.6 - 20.4  HC:      242.8  mm     G. Age:  26w 3d         27  %    HC/AC:       1.11       1.04 - 1.22  AC:      218.6  mm     G. Age:  26w 2d         42  %    FL/BPD:      69.9  %    71 - 87  FL:       46.3  mm     G. Age:  25w 3d         13  %    FL/AC:       21.2  %    20 - 24  LV:        5.4  mm  Est. FW:     881   gm    1 lb 15 oz     28  %     FW Discordancy      0 \ 9 % ---------------------------------------------------------------------- OB History  Gravidity:    6         Term:   3         SAB:   2  Living:       3 ---------------------------------------------------------------------- Gestational Age (Fetus A)  LMP:           30w 5d        Date:  10/25/23                  EDD:   07/31/24  U/S Today:     26w 2d  EDD:   08/31/24  Best:          nunzio 2d     Det. By:  Oris Rater         EDD:   08/31/24                                      (01/14/24) ---------------------------------------------------------------------- Targeted Anatomy (Fetus A)  Central Nervous System  Calvarium/Cranial V.:  Appears normal         Cereb./Vermis:          Previously seen  Cavum:                  Previously seen        Sales Executive:         Previously seen  Lateral Ventricles:    Previously seen        Midline Falx:           Previously seen  Choroid Plexus:        Previously seen  Spine  Cervical:              Appears normal         Sacral:                 Appears normal  Thoracic:              Appears normal         Shape/Curvature:        Appears normal  Lumbar:                Appears normal  Head/Neck  Lips:                  Appears normal         Profile:                Appears normal  Neck:                  Appears normal         Orbits/Eyes:            Appears normal  Nuchal Fold:           Appears normal         Mandible:               Appears normal  Nasal Bone:            Present                Maxilla:                Appears normal  Thorax  4 Chamber View:        Appears normal         Interventr. Septum:     Appears normal  Cardiac Rhythm:        Normal                 Cardiac Axis:           Normal  Cardiac Situs:         Appears normal         Diaphragm:              Appears normal  Rt Outflow Tract:      Previously seen        3 Vessel  View:          Previously seen  Lt Outflow Tract:      Previously seen        3 V Trachea View:       Previously seen  Aortic Arch:           Appears normal         IVC:                    Previously seen  Ductal Arch:           Previously seen        Crossing:               Previously seen  SVC:                   Appears normal  Abdomen  Ventral Wall:          Appears normal         Lt Kidney:              Appears normal  Cord Insertion:        Appears normal         Rt Kidney:              Appears normal  Situs:                 Appears normal         Bladder:                Appears normal  Stomach:               Appears normal  Extremities  Lt Humerus:            Previously seen        Lt Femur:               Previously seen  Rt Humerus:            Previously seen        Rt Femur:               Previously seen  Lt Forearm:            Previously seen         Lt Lower Leg:           Previously seen  Rt Forearm:            Previously seen        Rt Lower Leg:           Previously seen  Lt Hand:               Previously seen        Lt Foot:                Previously seen  Rt Hand:               Previously seen        Rt Foot:                Previously seen  Other  Umbilical Cord:        Normal 3-vessel        Genitalia:              Female-nml  Comment:     Technically difficult due to fetal position. ---------------------------------------------------------------------- Doppler - Fetal Vessels (Fetus A)  Umbilical Artery  S/D    %tile      RI    %tile      PI    %tile     PSV    ADFV    RDFV                                                     (cm/s)   3.71       75    0.73       76    1.24       82    43.85      No      No  Middle Cerebral Artery    S/D               RI               PI    %tile     PSV    MoM                                                     (cm/s)   3.71             0.73             1.24    < 2.5    27.39   < 1  Cerebroplacental Ratio                         MCA PI / UA PI     %tile                                       1    < 2.5 ---------------------------------------------------------------------- Fetal Evaluation (Fetus B)  Num Of Fetuses:          2  Cardiac Activity:        Observed  Fetal Lie:               Maternal left side  Presentation:            Cephalic  Placenta:                Anterior  Amniotic Fluid  AFI FV:      Within normal limits                              Largest Pocket(cm)                              7.22 ---------------------------------------------------------------------- Biometry (Fetus B)  BPD:      63.6  mm     G. Age:  25w 5d         22  %    CI:        73.61   %    70 - 86  FL/HC:       19.8  %    18.6 - 20.4  HC:      235.5  mm     G. Age:  25w 4d          9  %    HC/AC:       1.14       1.04 - 1.22  AC:      206.4  mm     G. Age:  25w 2d         13  %     FL/BPD:      73.3  %    71 - 87  FL:       46.6  mm     G. Age:  25w 4d         16  %    FL/AC:       22.6  %    20 - 24  Est. FW:     804   gm    1 lb 12 oz     11  %     FW Discordancy         9  % ---------------------------------------------------------------------- Gestational Age (Fetus B)  LMP:           30w 5d        Date:  10/25/23                  EDD:   07/31/24  U/S Today:     25w 4d                                        EDD:   09/05/24  Best:          26w 2d     Det. By:  Early Ultrasound         EDD:   08/31/24                                      (01/14/24) ---------------------------------------------------------------------- Targeted Anatomy (Fetus B)  Central Nervous System  Calvarium/Cranial V.:  Appears normal         Cereb./Vermis:          Previously seen  Cavum:                 Appears normal         Cisterna Magna:         Previously seen  Lateral Ventricles:    Previously seen        Midline Falx:           Appears normal  Choroid Plexus:        Previously seen  Spine  Cervical:              Previously seen        Sacral:                 Previously seen  Thoracic:              Previously seen        Shape/Curvature:        Previously seen  Lumbar:                Previously seen  Head/Neck  Lips:  Appears normal         Profile:                Previously seen  Neck:                  Appears normal         Orbits/Eyes:            Previously seen  Nuchal Fold:           Previously seen        Mandible:               Previously seen  Nasal Bone:            Present                Maxilla:                Previously seen  Thorax  4 Chamber View:        Appears normal         Interventr. Septum:     Previously seen  Cardiac Rhythm:        Normal                 Cardiac Axis:           Normal  Cardiac Situs:         Appears normal         Diaphragm:              Appears normal  Rt Outflow Tract:      Previously seen        3 Vessel View:          Previously seen  Lt Outflow Tract:       Previously seen        3 V Trachea View:       Previously seen  Aortic Arch:           Previously seen        IVC:                    Previously seen  Ductal Arch:           Previously seen        Crossing:               Appears normal  SVC:                   Previously seen  Abdomen  Ventral Wall:          Appears normal         Lt Kidney:              Appears normal  Cord Insertion:        Appears normal         Rt Kidney:              Appears normal  Situs:                 Appears normal         Bladder:                Appears normal  Stomach:               Appears normal  Extremities  Lt Humerus:            Previously seen  Lt Femur:               Previously seen  Rt Humerus:            Previously seen        Rt Femur:               Previously seen  Lt Forearm:            Previously seen        Lt Lower Leg:           Previously seen  Rt Forearm:            Previously seen        Rt Lower Leg:           Previously seen  Lt Hand:               Previously seen        Lt Foot:                Previously seen  Rt Hand:               Previously seen        Rt Foot:                Previously seen  Other  Umbilical Cord:        Normal 3-vessel        Genitalia:              Female-nml ---------------------------------------------------------------------- Doppler - Fetal Vessels (Fetus B)  Umbilical Artery    S/D    %tile      RI    %tile      PI    %tile     PSV    ADFV    RDFV                                                     (cm/s)   4.34       91    0.77       88    1.33       91    39.61      No      No  Middle Cerebral Artery                                                       PSV    MoM                                                     (cm/s)                                                      38.58   1.15 ---------------------------------------------------------------------- Cervix Uterus Adnexa  Cervix  Not visualized (advanced GA >24wks)  Uterus  No abnormality visualized.  Right Ovary  Not  visualized.  Left Ovary  Not visualized.  Cul De Sac  No free fluid seen.  Adnexa  No abnormality visualized ---------------------------------------------------------------------- Impression  Follow up growth for monochorionic diamnoitic twin pregnancy  Twin A normal stomach, amniotic fluid, and bladder -  Maternal left, Cephalic EFW: 28%  Twin B normal stomach, amniotic fluid, and bladder  -  Maternal right, Cephalic EFW 11%  Twin discordance is 9%  UA and MCA  Dopplers are normal. ---------------------------------------------------------------------- Recommendations  Continue q2 week TTTS and TAPS surviellance  Growth scheduled in 4 weeks. ----------------------------------------------------------------------                    Nathanel Fetters, MD Electronically Signed Corrected Final Report  05/28/2024 04:44 pm ----------------------------------------------------------------------   US  MFM MCA DOPPLER Result Date: 05/28/2024 ----------------------------------------------------------------------  OBSTETRICS REPORT                    (Corrected Final 05/28/2024 04:44 pm) ---------------------------------------------------------------------- Patient Info  ID #:       [id number]                          D.O.B.:  [birth year] (38 yrs)(F)  Name:       Pamela Alvarado              Visit Date: 05/27/2024 11:45 am              SIX[place]GA ---------------------------------------------------------------------- Performed By  Attending:        Nathanel Fetters      Ref. Address:      [address]                    [place]                                                              [place], [place]                                                              [address]  Performed By:     Meade Parker       [facility]  Referred By:      [facility] ---------------------------------------------------------------------- Orders  #  Description                           Alvarado        Ordered By  1  US  MFM OB FOLLOW UP                   [id number]    BURK SCHAIBLE  2  US  MFM UA CORD DOPPLER                [id number]    BURK SCHAIBLE  3  US  MFM UA ADDL GEST                   76820.01    BURK SCHAIBLE  4  US  MFM OB FOLLOW UP ADDL              23183.97    CORENTHIAN     GEST                                              BOOKER  5  US  MFM MCA DOPPLER                    76821.01    BURK SCHAIBLE  6  US  MFM MCA ADDL GEST                  23178.8     BURK SCHAIBLE ----------------------------------------------------------------------  #  Order #                     Accession #                Episode #  1  [id number]                   [id number]                 [id number]  2  [id number]                   [id number]                 [id number]  3  [id number]                   [id number]                 [id number]  4  [id number]                   [id number]                 [id number]  5  [id number]                   [id number]                 [id number]  6  [id number]                   [id number]                 [id number] ---------------------------------------------------------------------- Indications  [redacted] weeks gestation  of pregnancy                 Z3A.26  Twin pregnancy, mono/di, second trimester       O33.032  Advanced maternal age multigravida 2+,         O69.522  second trimester (38 yo)  Obesity complicating pregnancy, second          O99.212  trimester (PG BMI 34)  Pre-existing diabetes, type 2, in pregnancy,    O24.112  second trimester (insulin )  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 Cord Insertion:       Marg insertion previously seen  Membrane Desc:      Dividing Membrane seen  Amniotic Fluid  AFI FV:      Within normal limits                              Largest Pocket(cm)                              7.74 ---------------------------------------------------------------------- Biometry (Fetus A)  BPD:      66.2  mm     G. Age:  26w 5d         54  %    CI:        74.84   %    70 - 86                                                          FL/HC:       19.1  %    18.6 - 20.4  HC:      242.8  mm     G. Age:  26w 3d         27  %    HC/AC:       1.11       1.04 - 1.22  AC:      218.6  mm     G. Age:  26w 2d         42  %    FL/BPD:      69.9  %    71 - 87  FL:       46.3  mm     G. Age:  25w 3d         13  %    FL/AC:       21.2  %    20 - 24  LV:        5.4  mm  Est. FW:     881   gm    1 lb 15 oz     28  %     FW Discordancy      0 \ 9 % ---------------------------------------------------------------------- OB History  Gravidity:    6         Term:   3         SAB:   2  Living:       3 ---------------------------------------------------------------------- Gestational Age (Fetus A)  LMP:           30w 5d        Date:  10/25/23                  EDD:   07/31/24  U/S Today:     26w 2d                                        EDD:   08/31/24  Best:          26w 2d     Det. By:  Oris Rater         EDD:   08/31/24                                      (01/14/24) ---------------------------------------------------------------------- Targeted Anatomy (Fetus A)  Central Nervous System  Calvarium/Cranial V.:  Appears normal         Cereb./Vermis:          Previously seen  Cavum:                  Previously seen        Sales Executive:         Previously seen  Lateral Ventricles:    Previously seen        Midline Falx:           Previously seen  Choroid Plexus:        Previously seen  Spine  Cervical:              Appears normal         Sacral:                 Appears normal  Thoracic:              Appears normal         Shape/Curvature:        Appears normal  Lumbar:                Appears normal  Head/Neck  Lips:                  Appears normal         Profile:                Appears normal  Neck:                  Appears normal         Orbits/Eyes:            Appears normal  Nuchal Fold:           Appears normal         Mandible:  Appears normal  Nasal Bone:            Present                Maxilla:                Appears normal  Thorax  4 Chamber View:        Appears normal         Interventr. Septum:     Appears normal  Cardiac Rhythm:        Normal                 Cardiac Axis:           Normal  Cardiac Situs:         Appears normal         Diaphragm:              Appears normal  Rt Outflow Tract:      Previously seen        3 Vessel View:          Previously seen  Lt Outflow Tract:      Previously seen        3 V Trachea View:       Previously seen  Aortic Arch:           Appears normal         IVC:                    Previously seen  Ductal Arch:           Previously seen        Crossing:               Previously seen  SVC:                   Appears normal  Abdomen  Ventral Wall:          Appears normal         Lt Kidney:              Appears normal  Cord Insertion:        Appears normal         Rt Kidney:              Appears normal  Situs:                 Appears normal         Bladder:                Appears normal  Stomach:               Appears normal  Extremities  Lt Humerus:            Previously seen        Lt Femur:               Previously seen  Rt Humerus:            Previously seen        Rt Femur:               Previously seen  Lt Forearm:            Previously seen         Lt Lower Leg:           Previously seen  Rt Forearm:  Previously seen        Rt Lower Leg:           Previously seen  Lt Hand:               Previously seen        Lt Foot:                Previously seen  Rt Hand:               Previously seen        Rt Foot:                Previously seen  Other  Umbilical Cord:        Normal 3-vessel        Genitalia:              Female-nml  Comment:     Technically difficult due to fetal position. ---------------------------------------------------------------------- Doppler - Fetal Vessels (Fetus A)  Umbilical Artery    S/D    %tile      RI    %tile      PI    %tile     PSV    ADFV    RDFV                                                     (cm/s)   3.71       75    0.73       76    1.24       82    43.85      No      No  Middle Cerebral Artery    S/D               RI               PI    %tile     PSV    MoM                                                     (cm/s)   3.71             0.73             1.24    < 2.5    27.39   < 1  Cerebroplacental Ratio                         MCA PI / UA PI     %tile                                       1    < 2.5 ---------------------------------------------------------------------- Fetal Evaluation (Fetus B)  Num Of Fetuses:          2  Cardiac Activity:        Observed  Fetal Lie:               Maternal left side  Presentation:  Cephalic  Placenta:                Anterior  Amniotic Fluid  AFI FV:      Within normal limits                              Largest Pocket(cm)                              7.22 ---------------------------------------------------------------------- Biometry (Fetus B)  BPD:      63.6  mm     G. Age:  25w 5d         22  %    CI:        73.61   %    70 - 86                                                          FL/HC:       19.8  %    18.6 - 20.4  HC:      235.5  mm     G. Age:  25w 4d          9  %    HC/AC:       1.14       1.04 - 1.22  AC:      206.4  mm     G. Age:  25w 2d         13  %     FL/BPD:      73.3  %    71 - 87  FL:       46.6  mm     G. Age:  25w 4d         16  %    FL/AC:       22.6  %    20 - 24  Est. FW:     804   gm    1 lb 12 oz     11  %     FW Discordancy         9  % ---------------------------------------------------------------------- Gestational Age (Fetus B)  LMP:           30w 5d        Date:  10/25/23                  EDD:   07/31/24  U/S Today:     25w 4d                                        EDD:   09/05/24  Best:          26w 2d     Det. By:  Early Ultrasound         EDD:   08/31/24                                      (01/14/24) ---------------------------------------------------------------------- Targeted Anatomy (Fetus B)  Central Nervous System  Calvarium/Cranial V.:  Appears normal         Cereb./Vermis:          Previously seen  Cavum:                 Appears normal         Cisterna Magna:         Previously seen  Lateral Ventricles:    Previously seen        Midline Falx:           Appears normal  Choroid Plexus:        Previously seen  Spine  Cervical:              Previously seen        Sacral:                 Previously seen  Thoracic:              Previously seen        Shape/Curvature:        Previously seen  Lumbar:                Previously seen  Head/Neck  Lips:                  Appears normal         Profile:                Previously seen  Neck:                  Appears normal         Orbits/Eyes:            Previously seen  Nuchal Fold:           Previously seen        Mandible:               Previously seen  Nasal Bone:            Present                Maxilla:                Previously seen  Thorax  4 Chamber View:        Appears normal         Interventr. Septum:     Previously seen  Cardiac Rhythm:        Normal                 Cardiac Axis:           Normal  Cardiac Situs:         Appears normal         Diaphragm:              Appears normal  Rt Outflow Tract:      Previously seen        3 Vessel View:          Previously seen  Lt Outflow Tract:       Previously seen        3 V Trachea View:       Previously seen  Aortic Arch:           Previously seen        IVC:                    Previously seen  Ductal Arch:  Previously seen        Crossing:               Appears normal  SVC:                   Previously seen  Abdomen  Ventral Wall:          Appears normal         Lt Kidney:              Appears normal  Cord Insertion:        Appears normal         Rt Kidney:              Appears normal  Situs:                 Appears normal         Bladder:                Appears normal  Stomach:               Appears normal  Extremities  Lt Humerus:            Previously seen        Lt Femur:               Previously seen  Rt Humerus:            Previously seen        Rt Femur:               Previously seen  Lt Forearm:            Previously seen        Lt Lower Leg:           Previously seen  Rt Forearm:            Previously seen        Rt Lower Leg:           Previously seen  Lt Hand:               Previously seen        Lt Foot:                Previously seen  Rt Hand:               Previously seen        Rt Foot:                Previously seen  Other  Umbilical Cord:        Normal 3-vessel        Genitalia:              Female-nml ---------------------------------------------------------------------- Doppler - Fetal Vessels (Fetus B)  Umbilical Artery    S/D    %tile      RI    %tile      PI    %tile     PSV    ADFV    RDFV                                                     (cm/s)   4.34       91    0.77  88    1.33       91    39.61      No      No  Middle Cerebral Artery                                                       PSV    MoM                                                     (cm/s)                                                      38.58   1.15 ---------------------------------------------------------------------- Cervix Uterus Adnexa  Cervix  Not visualized (advanced GA >24wks)  Uterus  No abnormality visualized.  Right Ovary  Not  visualized.  Left Ovary  Not visualized.  Cul De Sac  No free fluid seen.  Adnexa  No abnormality visualized ---------------------------------------------------------------------- Impression  Follow up growth for monochorionic diamnoitic twin pregnancy  Twin A normal stomach, amniotic fluid, and bladder -  Maternal left, Cephalic EFW: 28%  Twin B normal stomach, amniotic fluid, and bladder  -  Maternal right, Cephalic EFW 11%  Twin discordance is 9%  UA and MCA  Dopplers are normal. ---------------------------------------------------------------------- Recommendations  Continue q2 week TTTS and TAPS surviellance  Growth scheduled in 4 weeks. ----------------------------------------------------------------------                    Nathanel Fetters, MD Electronically Signed Corrected Final Report  05/28/2024 04:44 pm ----------------------------------------------------------------------   US  MFM MCA ADDL GEST Result Date: 05/28/2024 ----------------------------------------------------------------------  OBSTETRICS REPORT                    (Corrected Final 05/28/2024 04:44 pm) ---------------------------------------------------------------------- Patient Info  ID #:       [id number]                          D.O.B.:  06/05/1986 (38 yrs)(F)  Name:       Pamela Alvarado              Visit Date: 05/27/2024 11:45 am              [place] ---------------------------------------------------------------------- Performed By  Attending:        Nathanel Fetters      Ref. Address:      [address]                    [place]                                                              [place], [place]  [address]  Performed By:     Meade Parker       [facility]  Referred By:      [facility] ---------------------------------------------------------------------- Orders  #  Description                           Alvarado        Ordered By  1  US  MFM OB FOLLOW UP                   76816.01    BURK SCHAIBLE  2  US  MFM UA CORD DOPPLER                76820.02    BURK SCHAIBLE  3  US  MFM UA ADDL GEST                   76820.01    BURK SCHAIBLE  4  US  MFM OB FOLLOW UP ADDL              23183.97    CORENTHIAN     GEST                                              BOOKER  5  US  MFM MCA DOPPLER                    76821.01    BURK SCHAIBLE  6  US  MFM MCA ADDL GEST                  23178.8     BURK SCHAIBLE ----------------------------------------------------------------------  #  Order #                     Accession #                Episode #  1  [id number]                   [id number]  [id number]  2  [id number]                   [id number]                 [id number]  [id number]  [id number]                   [id number]                 [id number]  [id number]  [id number]                   [id number]                 [id number]  5  [id number]                   [id number]                 [id number]  6  [id number]                   [id number]                 [id number] ---------------------------------------------------------------------- Indications  [redacted] weeks gestation of pregnancy                 Z3A.26  Twin pregnancy, mono/di, second trimester       O30.032  Advanced maternal age multigravida 81+,         O64.522  second trimester (38 yo)  Obesity complicating pregnancy, second          O99.212  trimester (PG BMI 34)  Pre-existing diabetes, type 2, in pregnancy,    O24.112  second trimester (insulin )  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 Cord Insertion:       Marg insertion previously seen  Membrane Desc:      Dividing Membrane seen  Amniotic Fluid  AFI FV:      Within normal limits                              Largest Pocket(cm)                              7.74 ---------------------------------------------------------------------- Biometry (Fetus A)  BPD:      66.2  mm     G. Age:  26w 5d         54  %    CI:        74.84   %    70 - 86  FL/HC:       19.1  %    18.6 - 20.4  HC:      242.8  mm     G. Age:  26w 3d         27  %    HC/AC:       1.11       1.04 - 1.22  AC:      218.6  mm     G. Age:  26w 2d         42  %    FL/BPD:      69.9  %    71 - 87  FL:       46.3  mm     G. Age:  25w 3d         13  %    FL/AC:       21.2  %    20 - 24  LV:        5.4  mm  Est. FW:     881   gm    1 lb 15 oz     28  %     FW Discordancy      0 \ 9 % ---------------------------------------------------------------------- OB History  Gravidity:    6         Term:   3         SAB:   2  Living:       3 ---------------------------------------------------------------------- Gestational Age (Fetus A)  LMP:           30w 5d        Date:  10/25/23                  EDD:   07/31/24  U/S Today:     26w 2d                                        EDD:   08/31/24  Best:          26w 2d     Det. By:  Oris Rater         EDD:   08/31/24                                      (01/14/24) ---------------------------------------------------------------------- Targeted Anatomy (Fetus A)  Central Nervous System  Calvarium/Cranial V.:  Appears normal         Cereb./Vermis:          Previously seen  Cavum:                  Previously seen        [place] [employer]:         Previously seen  Lateral Ventricles:    Previously seen        Midline Falx:           Previously seen  Choroid Plexus:        Previously seen  Spine  Cervical:              Appears normal         Sacral:                 Appears normal  Thoracic:  Appears normal         Shape/Curvature:        Appears normal  Lumbar:                Appears normal  Head/Neck  Lips:                  Appears normal         Profile:                Appears normal  Neck:                  Appears normal         Orbits/Eyes:            Appears normal  Nuchal Fold:           Appears normal         Mandible:               Appears normal  Nasal Bone:            Present                Maxilla:                Appears normal  Thorax  4 Chamber View:        Appears normal         Interventr. Septum:     Appears normal  Cardiac Rhythm:        Normal                 Cardiac Axis:           Normal  Cardiac Situs:         Appears normal         Diaphragm:              Appears normal  Rt Outflow Tract:      Previously seen        3 Vessel View:          Previously seen  Lt Outflow Tract:      Previously seen        3 V Trachea View:       Previously seen  Aortic Arch:           Appears normal         IVC:                    Previously seen  Ductal Arch:           Previously seen        Crossing:               Previously seen  SVC:                   Appears normal  Abdomen  Ventral Wall:          Appears normal         Lt Kidney:              Appears normal  Cord Insertion:        Appears normal         Rt Kidney:              Appears normal  Situs:                 Appears normal  Bladder:                Appears normal  Stomach:               Appears normal  Extremities  Lt Humerus:            Previously seen        Lt Femur:               Previously seen  Rt Humerus:            Previously seen        Rt Femur:               Previously seen  Lt Forearm:            Previously seen         Lt Lower Leg:           Previously seen  Rt Forearm:            Previously seen        Rt Lower Leg:           Previously seen  Lt Hand:               Previously seen        Lt Foot:                Previously seen  Rt Hand:               Previously seen        Rt Foot:                Previously seen  Other  Umbilical Cord:        Normal 3-vessel        Genitalia:              Female-nml  Comment:     Technically difficult due to fetal position. ---------------------------------------------------------------------- Doppler - Fetal Vessels (Fetus A)  Umbilical Artery    S/D    %tile      RI    %tile      PI    %tile     PSV    ADFV    RDFV                                                     (cm/s)   3.71       75    0.73       76    1.24       82    43.85      No      No  Middle Cerebral Artery    S/D               RI               PI    %tile     PSV    MoM                                                     (cm/s)   3.71  0.73             1.24    < 2.5    27.39   < 1  Cerebroplacental Ratio                         MCA PI / UA PI     %tile                                       1    < 2.5 ---------------------------------------------------------------------- Fetal Evaluation (Fetus B)  Num Of Fetuses:          2  Cardiac Activity:        Observed  Fetal Lie:               Maternal left side  Presentation:            Cephalic  Placenta:                Anterior  Amniotic Fluid  AFI FV:      Within normal limits                              Largest Pocket(cm)                              7.22 ---------------------------------------------------------------------- Biometry (Fetus B)  BPD:      63.6  mm     G. Age:  25w 5d         22  %    CI:        73.61   %    70 - 86                                                          FL/HC:       19.8  %    18.6 - 20.4  HC:      235.5  mm     G. Age:  25w 4d          9  %    HC/AC:       1.14       1.04 - 1.22  AC:      206.4  mm     G. Age:  25w 2d         13  %     FL/BPD:      73.3  %    71 - 87  FL:       46.6  mm     G. Age:  25w 4d         16  %    FL/AC:       22.6  %    20 - 24  Est. FW:     804   gm    1 lb 12 oz     11  %     FW Discordancy         9  % ---------------------------------------------------------------------- Gestational Age (Fetus B)  LMP:  30w 5d        Date:  10/25/23                  EDD:   07/31/24  U/S Today:     25w 4d                                        EDD:   09/05/24  Best:          26w 2d     Det. By:  Early Ultrasound         EDD:   08/31/24                                      (01/14/24) ---------------------------------------------------------------------- Targeted Anatomy (Fetus B)  Central Nervous System  Calvarium/Cranial V.:  Appears normal         Cereb./Vermis:          Previously seen  Cavum:                 Appears normal         [place]isterna Magna:         Previously seen  Lateral Ventricles:    Previously seen        Midline Falx:           Appears normal  Choroid Plexus:        Previously seen  Spine  Cervical:              Previously seen        Sacral:                 Previously seen  Thoracic:              Previously seen        Shape/Curvature:        Previously seen  Lumbar:                Previously seen  Head/Neck  Lips:                  Appears normal         Profile:                Previously seen  Neck:                  Appears normal         Orbits/Eyes:            Previously seen  Nuchal Fold:           Previously seen        Mandible:               Previously seen  Nasal Bone:            Present                Maxilla:                Previously seen  Thorax  4 Chamber View:        Appears normal         Interventr. Septum:     Previously seen  Cardiac Rhythm:        Normal  Cardiac Axis:           Normal  Cardiac Situs:         Appears normal         Diaphragm:              Appears normal  Rt Outflow Tract:      Previously seen        3 Vessel View:          Previously seen  Lt Outflow Tract:       Previously seen        3 V Trachea View:       Previously seen  Aortic Arch:           Previously seen        IVC:                    Previously seen  Ductal Arch:           Previously seen        Crossing:               Appears normal  SVC:                   Previously seen  Abdomen  Ventral Wall:          Appears normal         Lt Kidney:              Appears normal  Cord Insertion:        Appears normal         Rt Kidney:              Appears normal  Situs:                 Appears normal         Bladder:                Appears normal  Stomach:               Appears normal  Extremities  Lt Humerus:            Previously seen        Lt Femur:               Previously seen  Rt Humerus:            Previously seen        Rt Femur:               Previously seen  Lt Forearm:            Previously seen        Lt Lower Leg:           Previously seen  Rt Forearm:            Previously seen        Rt Lower Leg:           Previously seen  Lt Hand:               Previously seen        Lt Foot:                Previously seen  Rt Hand:               Previously seen        Rt Foot:  Previously seen  Other  Umbilical Cord:        Normal 3-vessel        Genitalia:              Female-nml ---------------------------------------------------------------------- Doppler - Fetal Vessels (Fetus B)  Umbilical Artery    S/D    %tile      RI    %tile      PI    %tile     PSV    ADFV    RDFV                                                     (cm/s)   4.34       91    0.77       88    1.33       91    39.61      No      No  Middle Cerebral Artery                                                       PSV    MoM                                                     (cm/s)                                                      38.58   1.15 ---------------------------------------------------------------------- Cervix Uterus Adnexa  Cervix  Not visualized (advanced GA >24wks)  Uterus  No abnormality visualized.  Right Ovary  Not  visualized.  Left Ovary  Not visualized.  Cul De Sac  No free fluid seen.  Adnexa  No abnormality visualized ---------------------------------------------------------------------- Impression  Follow up growth for monochorionic diamnoitic twin pregnancy  Twin A normal stomach, amniotic fluid, and bladder -  Maternal left, Cephalic EFW: 28%  Twin B normal stomach, amniotic fluid, and bladder  -  Maternal right, Cephalic EFW 11%  Twin discordance is 9%  UA and MCA  Dopplers are normal. ---------------------------------------------------------------------- Recommendations  Continue q2 week TTTS and TAPS surviellance  Growth scheduled in 4 weeks. ----------------------------------------------------------------------                    Nathanel Fetters, MD Electronically Signed Corrected Final Report  05/28/2024 04:44 pm ----------------------------------------------------------------------    Assessment and Plan:  Pregnancy: [id number] at [redacted]w[redacted]d 1. 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  2. Type 2 diabetes mellitus affecting pregnancy in third trimester, antepartum Did not bring log, reports fasting 80-90s, PP 120s or less, one 135. Emphasized importance of bringing log to every visit.  Continue growth scans and testing as per MFM. Continue current insulin  and metformin  regimen.. - Hemoglobin A1c - Comprehensive  metabolic panel with GFR - Protein / creatinine ratio, urine  3. History of gestational hypertension Stable BP  4. Obesity affecting pregnancy, antepartum, unspecified obesity type TWG 25 lbs  5. Pregnancy related hip pain in third trimester, antepartum Likely due to pressure/position of gravid uterus. Advised to take acetaminophen , she refused due to concerns about this causing autism even after she was told there was no direct correlation. Flexeril  recommended, she agreed.  -  cyclobenzaprine  (FLEXERIL ) 10 MG tablet; Take 1 tablet (10 mg total) by mouth 3 (three) times daily as needed for muscle spasms.  Dispense: 30 tablet; Refill: 2  6. Multigravida of advanced maternal age in third trimester LR NIPS  7. Need for Tdap vaccination - Tdap vaccine greater than or equal to 7yo IM  8. [redacted] weeks gestation of pregnancy 9. Supervision of high risk pregnancy, antepartum Third trimester labs today, will follow up results and manage accordingly. - CBC - RPR - HIV antibody (with reflex) - Hemoglobin A1c - Comprehensive metabolic panel with GFR - Protein / creatinine ratio, urine  Preterm labor symptoms and general obstetric precautions including but not limited to vaginal bleeding, contractions, leaking of fluid and fetal movement were reviewed in detail with the patient. Please refer to After Visit Summary for other counseling recommendations.   Return in about 2 weeks (around 06/24/2024) for OFFICE OB VISIT (MD only).  Future Appointments  Date Time Provider Department Center  06/23/2024  2:35 PM Ilean Norleen GAILS, MD [facility]  07/08/2024  9:15 AM [facility] PROVIDER 1 [facility]  07/08/2024  9:30 AM [facility] US1 [facility]  07/08/2024  1:15 PM Izell Harari, MD [facility]  07/22/2024  1:15 PM Cleatus Moccasin, MD [facility]  08/05/2024  1:15 PM Izell Harari, MD [facility]  08/12/2024  1:15 PM [facility] 1 [facility]  08/19/2024  1:15 PM [facility] 1 [facility]  08/26/2024  1:15 PM [facility] 1 [facility]  09/02/2024  1:15 PM [facility] 1 [facility]    Gloris Hugger, MD

## 2024-06-11 LAB — COMPREHENSIVE METABOLIC PANEL WITH GFR
ALT: 29 IU/L (ref 0–32)
AST: 15 IU/L (ref 0–40)
Albumin: 3.2 g/dL — ABNORMAL LOW (ref 3.9–4.9)
Alkaline Phosphatase: 156 IU/L — ABNORMAL HIGH (ref 41–116)
BUN/Creatinine Ratio: 14 (ref 9–23)
BUN: 7 mg/dL (ref 6–20)
Bilirubin Total: 0.7 mg/dL (ref 0.0–1.2)
CO2: 17 mmol/L — ABNORMAL LOW (ref 20–29)
Calcium: 8.3 mg/dL — ABNORMAL LOW (ref 8.7–10.2)
Chloride: 102 mmol/L (ref 96–106)
Creatinine, Ser: 0.49 mg/dL — ABNORMAL LOW (ref 0.57–1.00)
Globulin, Total: 2.3 g/dL (ref 1.5–4.5)
Glucose: 166 mg/dL — ABNORMAL HIGH (ref 70–99)
Potassium: 3.6 mmol/L (ref 3.5–5.2)
Sodium: 138 mmol/L (ref 134–144)
Total Protein: 5.5 g/dL — ABNORMAL LOW (ref 6.0–8.5)
eGFR: 125 mL/min/1.73 (ref >59)

## 2024-06-11 LAB — CBC
Hematocrit: 37.2 % (ref 34.0–46.6)
Hemoglobin: 12.8 g/dL (ref 11.1–15.9)
MCH: 30.8 pg (ref 26.6–33.0)
MCHC: 34.4 g/dL (ref 31.5–35.7)
MCV: 89 fL (ref 79–97)
Platelets: 241 x10E3/uL (ref 150–450)
RBC: 4.16 x10E6/uL (ref 3.77–5.28)
RDW: 12.3 % (ref 11.7–15.4)
WBC: 7.9 x10E3/uL (ref 3.4–10.8)

## 2024-06-11 LAB — SYPHILIS: RPR W/REFLEX TO RPR TITER AND TREPONEMAL ANTIBODIES, TRADITIONAL SCREENING AND DIAGNOSIS ALGORITHM: RPR Ser Ql: NONREACTIVE

## 2024-06-11 LAB — HIV ANTIBODY (ROUTINE TESTING W REFLEX): HIV Screen 4th Generation wRfx: NONREACTIVE

## 2024-06-11 LAB — HEMOGLOBIN A1C
Est. average glucose Bld gHb Est-mCnc: 140 mg/dL
Hgb A1c MFr Bld: 6.5 % — ABNORMAL HIGH (ref 4.8–5.6)

## 2024-06-12 ENCOUNTER — Ambulatory Visit: Payer: Self-pay | Admitting: Obstetrics & Gynecology

## 2024-06-12 ENCOUNTER — Other Ambulatory Visit: Payer: Self-pay

## 2024-06-23 ENCOUNTER — Other Ambulatory Visit: Payer: Self-pay

## 2024-06-23 ENCOUNTER — Ambulatory Visit (INDEPENDENT_AMBULATORY_CARE_PROVIDER_SITE_OTHER): Payer: Self-pay | Admitting: Family Medicine

## 2024-06-23 VITALS — BP 115/83 | HR 72 | Wt 175.8 lb

## 2024-06-23 DIAGNOSIS — Z3A30 30 weeks gestation of pregnancy: Secondary | ICD-10-CM

## 2024-06-23 DIAGNOSIS — M25559 Pain in unspecified hip: Secondary | ICD-10-CM

## 2024-06-23 DIAGNOSIS — O99210 Obesity complicating pregnancy, unspecified trimester: Secondary | ICD-10-CM

## 2024-06-23 DIAGNOSIS — O0990 Supervision of high risk pregnancy, unspecified, unspecified trimester: Secondary | ICD-10-CM

## 2024-06-23 DIAGNOSIS — O99213 Obesity complicating pregnancy, third trimester: Secondary | ICD-10-CM

## 2024-06-23 DIAGNOSIS — Z8759 Personal history of other complications of pregnancy, childbirth and the puerperium: Secondary | ICD-10-CM

## 2024-06-23 DIAGNOSIS — O09523 Supervision of elderly multigravida, third trimester: Secondary | ICD-10-CM

## 2024-06-23 DIAGNOSIS — O30033 Twin pregnancy, monochorionic/diamniotic, third trimester: Secondary | ICD-10-CM

## 2024-06-23 DIAGNOSIS — O0993 Supervision of high risk pregnancy, unspecified, third trimester: Secondary | ICD-10-CM

## 2024-06-23 DIAGNOSIS — O26893 Other specified pregnancy related conditions, third trimester: Secondary | ICD-10-CM

## 2024-06-23 DIAGNOSIS — O24113 Pre-existing diabetes mellitus, type 2, in pregnancy, third trimester: Secondary | ICD-10-CM

## 2024-06-24 NOTE — Progress Notes (Signed)
 "  PRENATAL VISIT NOTE  Subjective:  Pamela Alvarado is a 38 y.o. [phone] at [redacted]w[redacted]d being seen today for ongoing prenatal care.  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  Patient reports no bleeding, no contractions, no cramping, and no leaking.  Contractions: Irregular (two nights ago, lasted about 4 hours). Vag. Bleeding: None.  Movement: Present. Denies leaking of fluid.   The following portions of the patient's history were reviewed and updated as appropriate: allergies, current medications, past family history, past medical history, past social history, past surgical history and problem list.   Objective:   Vitals:   06/23/24 1440  BP: 115/83  Pulse: 72  Weight: 175 lb 12.8 oz (79.7 kg)    Fetal Status:  Fetal Heart Rate (bpm): 151/156   Movement: Present    General: Alert, oriented and cooperative. Patient is in no acute distress.  Skin: Skin is warm and dry. No rash noted.   Cardiovascular: Normal heart rate noted  Respiratory: Normal respiratory effort, no problems with respiration noted  Abdomen: Soft, gravid, appropriate for gestational age.  Pain/Pressure: Present     Pelvic: Cervical exam deferred        Extremities: Normal range of motion.     Mental Status: Normal mood and affect. Normal behavior. Normal judgment and thought content.      02/14/2024   11:05 AM 09/06/2022    3:26 PM 04/12/2022   12:09 PM  Depression screen PHQ 2/9  Decreased Interest 0 0 1  Down, Depressed, Hopeless 0 0 1  PHQ - 2 Score 0 0 2  Altered sleeping 0  1  Tired, decreased energy 3  2   Change in appetite 3  2  Feeling bad or failure about yourself  0  1  Trouble concentrating 0  1  Moving slowly or fidgety/restless 0  1  Suicidal thoughts 0  1  PHQ-9 Score 6   11   Difficult doing work/chores   Somewhat difficult     Data saved with a previous flowsheet row definition        02/14/2024   11:05 AM 04/12/2022   12:09 PM 03/14/2022    3:44 PM 12/07/2021   10:28 AM  GAD 7 : Generalized Anxiety Score  Nervous, Anxious, on Edge 0 1 1 0  Control/stop worrying 0 2 3 0  Worry too much - different things 0 2 2 0  Trouble relaxing 0 2 2 0  Restless 0 2 2 0  Easily annoyed or irritable 0 2 1 0  Afraid - awful might happen 0 2 1 0  Total GAD 7 Score 0 13 12 0  Anxiety Difficulty  Very difficult      Assessment and Plan:  Pregnancy: [id number] at [redacted]w[redacted]d 1. Supervision of high risk pregnancy, antepartum (Primary) FHR of both fetuses within normal limits BP appropriate today  2. Multigravida of advanced maternal age in third trimester On ASA  3. Pregnancy related hip pain in third trimester, antepartum Improving but still present.  Patient has Flexeril .  Counseled on stretching, Tylenol  as needed.  4. Obesity affecting pregnancy, antepartum, unspecified obesity type  5. History of gestational hypertension BP within normal limits  6. Type 2 diabetes mellitus affecting pregnancy in third trimester, antepartum Postprandial blood glucoses overall well-controlled.  Every fasting is elevated.  Currently on 12 units of NPH twice daily and 12 units of regular insulin  3 times a day.  Patient reports that she eats a lot of carbs like oatmeal and sweets at night.  She also eats a nightly orange.  Discussed decreasing carb intake and making dietary changes prior to changing insulin  regimen and patient will work on this.  If still elevated fastings will likely need to increase NPH dose.  7. 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 [redacted] weeks  gestation of pregnancy   Preterm labor symptoms and general obstetric precautions including but not limited to vaginal bleeding, contractions, leaking of fluid and fetal movement were reviewed in detail with the patient. Please refer to After Visit Summary for other counseling recommendations.   No follow-ups on file.  Future Appointments  Date Time Provider Department Center  07/08/2024  9:15 AM [facility] PROVIDER 1 [facility]  07/08/2024  9:30 AM [facility] US1 [facility]  07/08/2024  1:15 PM Izell Harari, MD [facility]  07/22/2024  1:15 PM Cleatus Moccasin, MD [facility]  08/05/2024  1:15 PM Izell Harari, MD [facility]  08/12/2024  1:15 PM [facility] 1 [facility]  08/19/2024  1:15 PM [facility] 1 [facility]  08/26/2024  1:15 PM [facility] 1 [facility]  09/02/2024  1:15 PM [facility] 1 [facility]    Norleen LULLA Rover, MD  "

## 2024-06-28 ENCOUNTER — Other Ambulatory Visit: Payer: Self-pay

## 2024-06-28 ENCOUNTER — Encounter (HOSPITAL_COMMUNITY): Payer: Self-pay | Admitting: Obstetrics & Gynecology

## 2024-06-28 ENCOUNTER — Inpatient Hospital Stay (HOSPITAL_COMMUNITY)
Admission: AD | Admit: 2024-06-28 | Discharge: 2024-06-28 | Disposition: A | Discharge status: Home / Self Care | Payer: Self-pay | Attending: Obstetrics & Gynecology | Admitting: Obstetrics & Gynecology

## 2024-06-28 DIAGNOSIS — O09523 Supervision of elderly multigravida, third trimester: Secondary | ICD-10-CM | POA: Insufficient documentation

## 2024-06-28 DIAGNOSIS — O4700 False labor before 37 completed weeks of gestation, unspecified trimester: Secondary | ICD-10-CM

## 2024-06-28 DIAGNOSIS — O4703 False labor before 37 completed weeks of gestation, third trimester: Secondary | ICD-10-CM | POA: Insufficient documentation

## 2024-06-28 DIAGNOSIS — Z603 Acculturation difficulty: Secondary | ICD-10-CM | POA: Insufficient documentation

## 2024-06-28 DIAGNOSIS — B379 Candidiasis, unspecified: Secondary | ICD-10-CM

## 2024-06-28 DIAGNOSIS — O98813 Other maternal infectious and parasitic diseases complicating pregnancy, third trimester: Secondary | ICD-10-CM | POA: Insufficient documentation

## 2024-06-28 DIAGNOSIS — Z3A30 30 weeks gestation of pregnancy: Secondary | ICD-10-CM

## 2024-06-28 LAB — URINALYSIS, ROUTINE W REFLEX MICROSCOPIC
Bilirubin Urine: NEGATIVE
Glucose, UA: NEGATIVE mg/dL
Hgb urine dipstick: NEGATIVE
Ketones, ur: NEGATIVE mg/dL
Nitrite: NEGATIVE
Protein, ur: 30 mg/dL — AB
Specific Gravity, Urine: 1.015 (ref 1.005–1.030)
pH: 6 (ref 5.0–8.0)

## 2024-06-28 LAB — WET PREP, GENITAL
Clue Cells Wet Prep HPF POC: NONE SEEN
Sperm: NONE SEEN
Trich, Wet Prep: NONE SEEN
WBC, Wet Prep HPF POC: >=10 — AB
Yeast Wet Prep HPF POC: NONE SEEN

## 2024-06-28 LAB — FETAL FIBRONECTIN: Fetal Fibronectin: POSITIVE — AB

## 2024-06-28 LAB — RUPTURE OF MEMBRANE (ROM)PLUS: Rom Plus: NEGATIVE

## 2024-06-28 MED ORDER — TERBUTALINE SULFATE 1 MG/ML IJ SOLN: 0.2500 mg | INTRAMUSCULAR | Status: DC | PRN

## 2024-06-28 MED ORDER — LACTATED RINGERS IV BOLUS
1000.0000 mL | Freq: Once | INTRAVENOUS | Status: AC
  Administered 2024-06-28: 1000 mL via INTRAVENOUS

## 2024-06-28 MED ORDER — NIFEDIPINE 10 MG PO CAPS
10.0000 mg | ORAL_CAPSULE | ORAL | Status: AC | PRN
  Administered 2024-06-28 (×3): 10 mg via ORAL

## 2024-06-28 MED ORDER — NIFEDIPINE 20 MG PO CAPS
20.0000 mg | ORAL_CAPSULE | Freq: Three times a day (TID) | ORAL | 0 refills | Status: DC
  Filled 2024-06-28: qty 90, 30d supply, fill #0

## 2024-06-28 MED ORDER — NIFEDIPINE 10 MG PO CAPS
30.0000 mg | ORAL_CAPSULE | Freq: Once | ORAL | Status: DC
  Filled 2024-06-28: qty 3

## 2024-06-28 MED ORDER — TERBUTALINE SULFATE 1 MG/ML IJ SOLN
0.2500 mg | Freq: Once | INTRAMUSCULAR | Status: AC
  Administered 2024-06-28: 0.25 mg via SUBCUTANEOUS
  Filled 2024-06-28: qty 1

## 2024-06-28 MED ORDER — NIFEDIPINE 10 MG PO CAPS: 10.0000 mg | ORAL_CAPSULE | Freq: Four times a day (QID) | ORAL | Status: DC

## 2024-06-28 MED ORDER — BETAMETHASONE SOD PHOS & ACET 6 (3-3) MG/ML IJ SUSP: 12.0000 mg | INTRAMUSCULAR | Status: DC

## 2024-06-28 MED ORDER — TERCONAZOLE 0.4 % VA CREA
1.0000 | TOPICAL_CREAM | Freq: Every day | VAGINAL | 0 refills | Status: DC
  Filled 2024-06-28: qty 45, 7d supply, fill #0

## 2024-06-28 NOTE — MAU Note (Signed)
.  Pamela Alvarado is a 38 y.o. at [redacted]w[redacted]d here in MAU reporting: On the 23rd leaking of fluid with a strange odor that's not happening today. Patient reports her stomach was round, but now it has taken on another shape. Pt was told to reports any changed. Vomiting 1 times today. Ctx's that started today at 0200.   Onset of complaint:Dec 22 Pain score: 6/10 There were no vitals filed for this visit.    Lab orders placed from triage:   ua

## 2024-06-28 NOTE — MAU Provider Note (Signed)
 " History     CSN: [id number]  Arrival date and time: 06/28/24 1008   Event Date/Time   First Provider Initiated Contact with Patient 06/28/24 1118      Chief Complaint  Patient presents with   Contractions   Pamela Alvarado , a  38 y.o. [phone] at [redacted]w[redacted]d presents to MAU with complaints of leaking of fluid, and contractions. Patient states that 2 days ago she started leaking a clear watery sticky vaginal discharge. Notices it even outside of urinating. Denies smelling foul or like urine. She also reports contractions that started this morning at 2 am. Reports ctx every 2-5 mins in her low back that she currently rates as a 6/10. Denies attempting to relieve symptoms. Last intercourse was 2 days ago. She denies vaginal bleeding and endorses positive fetal movement from both babies.          OB History     Gravida  6   Para  3   Term  3   Preterm  0   AB  2   Living  3      SAB  2   IAB  0   Ectopic  0   Multiple      Live Births  3        Obstetric Comments  Pt states with 1st Preg had be Induced due to low amniotic fluid and high BP, she couldn't really explain what happened          Past Medical History:  Diagnosis Date   Cellulitis 08/28/2020   DM (diabetes mellitus) (HCC) 08/29/2020   Gestational hypertension    First pregnancy   Leg laceration 08/20/2020   Ovarian cyst 07/03/2002   in [place]   Type 2 diabetes mellitus with obesity 07/19/2021    Past Surgical History:  Procedure Laterality Date   CHOLECYSTECTOMY, LAPAROSCOPIC     I & D EXTREMITY Right 08/20/2020   Procedure: IRRIGATION AND DEBRIDEMENT EXTREMITY and closure of leg wound;  Surgeon: Beverley Evalene BIRCH, MD;  Location: [facility] OR;  Service: Orthopedics;  Laterality: Right;  IRRIGATION AND DEBRIDEMENT EXTREMITY and closure of leg wound   NO PAST SURGERIES     WISDOM TOOTH EXTRACTION Bilateral     Family History  Problem Relation Age of Onset   Diabetes Maternal  Grandmother    Tuberculosis Maternal Grandfather    Diabetes Paternal Grandmother    Diabetes Paternal Grandfather    Asthma Neg Hx    Cancer Neg Hx    Heart disease Neg Hx    Hypertension Neg Hx     Social History[1]  Allergies: Allergies[2]  No medications prior to admission.    Review of Systems  Constitutional:  Negative for chills, fatigue and fever.  Eyes:  Negative for pain and visual disturbance.  Respiratory:  Negative for apnea, shortness of breath and wheezing.   Cardiovascular:  Negative for chest pain and palpitations.  Gastrointestinal:  Positive for abdominal pain. Negative for constipation, diarrhea, nausea and vomiting.  Genitourinary:  Positive for pelvic pain and vaginal discharge. Negative for difficulty urinating, dysuria, vaginal bleeding and vaginal pain.  Musculoskeletal:  Positive for back pain.  Neurological:  Negative for seizures, weakness and headaches.  Psychiatric/Behavioral:  Negative for suicidal ideas.    Physical Exam   Blood pressure (!) 110/58, pulse 92, temperature 98.2 F (36.8 C), resp. rate 20, weight 80.3 kg, last menstrual period 10/25/2023, SpO2 100%, unknown if currently breastfeeding.  Physical Exam Vitals and nursing  note reviewed. Exam conducted with a chaperone present Ivy Rishque RN).  Constitutional:      General: She is not in acute distress.    Appearance: Normal appearance.  HENT:     Head: Normocephalic.  Cardiovascular:     Rate and Rhythm: Normal rate and regular rhythm.  Pulmonary:     Effort: Pulmonary effort is normal.  Abdominal:     Tenderness: There is no abdominal tenderness.     Comments: Gravid uterus   Genitourinary:    Vagina: Vaginal discharge present.     Comments: Copious amounts of yellowish green vaginal discharge consistent with yeast. Also a thin watery discharge that pooled into the speculum.   Dilation: 2 Effacement (%): Thick Exam by:: Claris Cedar, CNM  Musculoskeletal:     Cervical  back: Normal range of motion.  Skin:    General: Skin is warm and dry.  Neurological:     Mental Status: She is alert and oriented to person, place, and time.  Psychiatric:        Mood and Affect: Mood normal.    FHTA: 130bpm with moderate variability accel present no decels  FHTB: 125 bpm with moderate variability. Accels present no decels  Toco: Contractions every 2-5 mins   MAU Course  Procedures Orders Placed This Encounter  Procedures   Wet prep, genital   Culture, OB Urine   Urinalysis, Routine w reflex microscopic -Urine, Clean Catch   Fetal fibronectin   Rupture of Membrane (ROM) Plus   Contraction - monitoring   Activity as tolerated   Continuous tocometry   Insert peripheral IV   Discharge patient Discharge disposition: 01-Home or Self Care; Discharge patient date: 06/28/2024   Meds ordered this encounter  Medications   lactated ringers  bolus 1,000 mL   DISCONTD: betamethasone  acetate-betamethasone  sodium phosphate  (CELESTONE ) injection 12 mg   terbutaline  (BRETHINE ) injection 0.25 mg   terbutaline  (BRETHINE ) injection 0.25 mg   terbutaline  (BRETHINE ) injection 0.25 mg   DISCONTD: NIFEdipine  (PROCARDIA ) capsule 30 mg   DISCONTD: NIFEdipine  (PROCARDIA ) capsule 10 mg   NIFEdipine  (PROCARDIA ) capsule 10 mg   terconazole  (TERAZOL 7 ) 0.4 % vaginal cream    Sig: Place 1 applicator vaginally at bedtime. Use for seven days    Dispense:  45 g    Refill:  0    Supervising Provider:   PRATT, TANYA S [[id number]]   NIFEdipine  (PROCARDIA ) 20 MG capsule    Sig: Take 1 capsule (20 mg total) by mouth 3 (three) times daily.    Dispense:  90 capsule    Refill:  0    Supervising Provider:   PRATT, TANYA S [[id number]]   Results for orders placed or performed during the hospital encounter of 06/28/24 (from the past 24 hours)  Urinalysis, Routine w reflex microscopic -Urine, Clean Catch     Status: Abnormal   Collection Time: 06/28/24 11:50 AM  Result Value Ref Range   Color, Urine  YELLOW YELLOW   APPearance HAZY (A) CLEAR   Specific Gravity, Urine 1.015 1.005 - 1.030   pH 6.0 5.0 - 8.0   Glucose, UA NEGATIVE NEGATIVE mg/dL   Hgb urine dipstick NEGATIVE NEGATIVE   Bilirubin Urine NEGATIVE NEGATIVE   Ketones, ur NEGATIVE NEGATIVE mg/dL   Protein, ur 30 (A) NEGATIVE mg/dL   Nitrite NEGATIVE NEGATIVE   Leukocytes,Ua MODERATE (A) NEGATIVE   RBC / HPF 0-5 0 - 5 RBC/hpf   WBC, UA 6-10 0 - 5 WBC/hpf   Bacteria, UA RARE (  A) NONE SEEN   Squamous Epithelial / HPF 0-5 0 - 5 /HPF   Mucus PRESENT    Ca Oxalate Crys, UA PRESENT   Fetal fibronectin     Status: Abnormal   Collection Time: 06/28/24 12:12 PM  Result Value Ref Range   Fetal Fibronectin POSITIVE (A) NEGATIVE  Wet prep, genital     Status: Abnormal   Collection Time: 06/28/24 12:12 PM  Result Value Ref Range   Yeast Wet Prep HPF POC NONE SEEN NONE SEEN   Trich, Wet Prep NONE SEEN NONE SEEN   Clue Cells Wet Prep HPF POC NONE SEEN NONE SEEN   WBC, Wet Prep HPF POC >=10 (A) <10   Sperm NONE SEEN   Rupture of Membrane (ROM) Plus     Status: None   Collection Time: 06/28/24 12:12 PM  Result Value Ref Range   Rom Plus NEGATIVE      MDM - IV fluids given for dehydration causing contractions  - Equivocal for pooling, fern negative.  Rom plus negative. Low suspicion for PPROM at this time.  - UA reflexed to culture to rule out UTI  - FFN positive - Consulted Dr. Jayne, on plan of care given Preterm contractions in Mono/di twins.  - Per MD give Terb and procardia , and reassess contractions in 2-4 hours.  - CNM to patient bedside to review plan of care. Patient and FOB agreeable to plan of care.  - Upon reassessment, exam unchanged,  contractions spaced out to 10-15 mins apart.  - plan for discharge.   Assessment and Plan   1. Preterm uterine contractions   2. [redacted] weeks gestation of pregnancy   3. Yeast infection   4. Language barrier    - Reviewed preterm contractions at 30 weeks of gestation,  - Rx for  Procardia  and Terazol sent to outpatient pharmacy for pick up.  - Also rx for procardia  sent to outpatient pharmacy. Admin instructions given at bedside with the use of an interpreter.  - Reviewed worsening signs and return precautions - FHTA and FHTB appropriate for gestational age at time of discharge.  - Patient discharged home in stable condition and may return to MAU as needed.   Due to language barrier, an interpreter was present during the history-taking and subsequent discussion (and for part of the physical exam) with this patient. - In person interpreters Raquel and Ana used for the duration of this visit.   Claris CHRISTELLA Cedar, MSN CNM  06/28/2024, 8:38 PM       [1]  Social History Tobacco Use   Smoking status: Never   Smokeless tobacco: Never  Substance Use Topics   Alcohol  use: No   Drug use: No  [2] No Known Allergies  "

## 2024-06-29 ENCOUNTER — Inpatient Hospital Stay (HOSPITAL_COMMUNITY): Payer: MEDICAID | Admitting: Anesthesiology

## 2024-06-29 ENCOUNTER — Inpatient Hospital Stay (HOSPITAL_COMMUNITY)
Admission: AD | Admit: 2024-06-29 | Discharge: 2024-07-02 | DRG: 786 | Disposition: A | Discharge status: Home / Self Care | Payer: MEDICAID | Attending: Obstetrics and Gynecology | Admitting: Obstetrics and Gynecology

## 2024-06-29 ENCOUNTER — Other Ambulatory Visit: Payer: Self-pay

## 2024-06-29 ENCOUNTER — Encounter (HOSPITAL_COMMUNITY)
Admission: AD | Disposition: A | Discharge status: Home / Self Care | Payer: Self-pay | Source: Home / Self Care | Attending: Obstetrics and Gynecology

## 2024-06-29 ENCOUNTER — Encounter (HOSPITAL_COMMUNITY): Payer: Self-pay | Admitting: Obstetrics & Gynecology

## 2024-06-29 ENCOUNTER — Encounter (HOSPITAL_COMMUNITY): Payer: Self-pay

## 2024-06-29 DIAGNOSIS — O09523 Supervision of elderly multigravida, third trimester: Secondary | ICD-10-CM

## 2024-06-29 DIAGNOSIS — O24113 Pre-existing diabetes mellitus, type 2, in pregnancy, third trimester: Secondary | ICD-10-CM | POA: Diagnosis present

## 2024-06-29 DIAGNOSIS — E119 Type 2 diabetes mellitus without complications: Secondary | ICD-10-CM | POA: Diagnosis present

## 2024-06-29 DIAGNOSIS — O134 Gestational [pregnancy-induced] hypertension without significant proteinuria, complicating childbirth: Secondary | ICD-10-CM | POA: Diagnosis present

## 2024-06-29 DIAGNOSIS — O99214 Obesity complicating childbirth: Secondary | ICD-10-CM | POA: Diagnosis present

## 2024-06-29 DIAGNOSIS — O30033 Twin pregnancy, monochorionic/diamniotic, third trimester: Secondary | ICD-10-CM | POA: Diagnosis present

## 2024-06-29 DIAGNOSIS — O2412 Pre-existing diabetes mellitus, type 2, in childbirth: Secondary | ICD-10-CM | POA: Diagnosis present

## 2024-06-29 DIAGNOSIS — Z3A31 31 weeks gestation of pregnancy: Secondary | ICD-10-CM

## 2024-06-29 DIAGNOSIS — O321XX0 Maternal care for breech presentation, not applicable or unspecified: Secondary | ICD-10-CM

## 2024-06-29 DIAGNOSIS — Z7982 Long term (current) use of aspirin: Secondary | ICD-10-CM

## 2024-06-29 DIAGNOSIS — Z603 Acculturation difficulty: Secondary | ICD-10-CM | POA: Diagnosis present

## 2024-06-29 DIAGNOSIS — O09529 Supervision of elderly multigravida, unspecified trimester: Secondary | ICD-10-CM

## 2024-06-29 DIAGNOSIS — Z7984 Long term (current) use of oral hypoglycemic drugs: Secondary | ICD-10-CM

## 2024-06-29 DIAGNOSIS — Z6838 Body mass index (BMI) 38.0-38.9, adult: Secondary | ICD-10-CM

## 2024-06-29 DIAGNOSIS — O321XX1 Maternal care for breech presentation, fetus 1: Secondary | ICD-10-CM | POA: Diagnosis present

## 2024-06-29 DIAGNOSIS — O24424 Gestational diabetes mellitus in childbirth, insulin controlled: Secondary | ICD-10-CM

## 2024-06-29 DIAGNOSIS — Z833 Family history of diabetes mellitus: Secondary | ICD-10-CM | POA: Diagnosis not present

## 2024-06-29 DIAGNOSIS — Z794 Long term (current) use of insulin: Secondary | ICD-10-CM | POA: Diagnosis not present

## 2024-06-29 DIAGNOSIS — O329XX0 Maternal care for malpresentation of fetus, unspecified, not applicable or unspecified: Principal | ICD-10-CM | POA: Diagnosis present

## 2024-06-29 DIAGNOSIS — Z3490 Encounter for supervision of normal pregnancy, unspecified, unspecified trimester: Secondary | ICD-10-CM

## 2024-06-29 DIAGNOSIS — O42013 Preterm premature rupture of membranes, onset of labor within 24 hours of rupture, third trimester: Secondary | ICD-10-CM

## 2024-06-29 DIAGNOSIS — O99210 Obesity complicating pregnancy, unspecified trimester: Secondary | ICD-10-CM | POA: Diagnosis present

## 2024-06-29 LAB — CBC
HCT: 44.1 % (ref 36.0–46.0)
Hemoglobin: 15.6 g/dL — ABNORMAL HIGH (ref 12.0–15.0)
MCH: 29.9 pg (ref 26.0–34.0)
MCHC: 35.4 g/dL (ref 30.0–36.0)
MCV: 84.5 fL (ref 80.0–100.0)
Platelets: 317 K/uL (ref 150–400)
RBC: 5.22 MIL/uL — ABNORMAL HIGH (ref 3.87–5.11)
RDW: 12.6 % (ref 11.5–15.5)
WBC: 13 K/uL — ABNORMAL HIGH (ref 4.0–10.5)
nRBC: 0 % (ref 0.0–0.2)

## 2024-06-29 LAB — CULTURE, OB URINE: Culture: >=100000 — AB

## 2024-06-29 LAB — TYPE AND SCREEN
ABO/RH(D): O POS
Antibody Screen: NEGATIVE

## 2024-06-29 LAB — GLUCOSE, CAPILLARY
Glucose-Capillary: 124 mg/dL — ABNORMAL HIGH (ref 70–99)
Glucose-Capillary: 126 mg/dL — ABNORMAL HIGH (ref 70–99)
Glucose-Capillary: 199 mg/dL — ABNORMAL HIGH (ref 70–99)

## 2024-06-29 MED ORDER — KETOROLAC TROMETHAMINE 30 MG/ML IJ SOLN
30.0000 mg | Freq: Once | INTRAMUSCULAR | Status: AC | PRN
  Administered 2024-06-29: 30 mg via INTRAVENOUS

## 2024-06-29 MED ORDER — BUPIVACAINE IN DEXTROSE 0.75-8.25 % IT SOLN
INTRATHECAL | Status: DC | PRN
  Administered 2024-06-29: 11.25 mg via INTRATHECAL

## 2024-06-29 MED ORDER — OXYCODONE HCL 5 MG/5ML PO SOLN: 5.0000 mg | Freq: Once | ORAL | Status: DC | PRN

## 2024-06-29 MED ORDER — MEPERIDINE HCL 25 MG/ML IJ SOLN: 6.2500 mg | INTRAMUSCULAR | Status: DC | PRN

## 2024-06-29 MED ORDER — OXYTOCIN-SODIUM CHLORIDE 30-0.9 UT/500ML-% IV SOLN
INTRAVENOUS | Status: AC
  Filled 2024-06-29: qty 500

## 2024-06-29 MED ORDER — IBUPROFEN 600 MG PO TABS
600.0000 mg | ORAL_TABLET | Freq: Four times a day (QID) | ORAL | Status: DC
  Administered 2024-06-30 – 2024-07-02 (×7): 600 mg via ORAL

## 2024-06-29 MED ORDER — TERBUTALINE SULFATE 1 MG/ML IJ SOLN
0.2500 mg | Freq: Once | INTRAMUSCULAR | Status: AC
  Administered 2024-06-29: 0.25 mg via SUBCUTANEOUS

## 2024-06-29 MED ORDER — COCONUT OIL OIL
1.0000 | TOPICAL_OIL | Status: DC | PRN
  Administered 2024-06-29: 1 via TOPICAL

## 2024-06-29 MED ORDER — NALOXONE HCL 0.4 MG/ML IJ SOLN: 0.4000 mg | INTRAMUSCULAR | Status: DC | PRN

## 2024-06-29 MED ORDER — FENTANYL CITRATE (PF) 100 MCG/2ML IJ SOLN
INTRAMUSCULAR | Status: AC
  Filled 2024-06-29: qty 2

## 2024-06-29 MED ORDER — ONDANSETRON HCL 4 MG/2ML IJ SOLN
INTRAMUSCULAR | Status: AC
  Filled 2024-06-29: qty 2

## 2024-06-29 MED ORDER — SIMETHICONE 80 MG PO CHEW
80.0000 mg | CHEWABLE_TABLET | Freq: Three times a day (TID) | ORAL | Status: DC
  Administered 2024-06-29 – 2024-07-02 (×8): 80 mg via ORAL
  Filled 2024-06-29: qty 1

## 2024-06-29 MED ORDER — SODIUM CHLORIDE 0.9 % IV SOLN
INTRAVENOUS | Status: AC
  Filled 2024-06-29: qty 5

## 2024-06-29 MED ORDER — CEFAZOLIN SODIUM-DEXTROSE 2-3 GM-%(50ML) IV SOLR
INTRAVENOUS | Status: DC | PRN
  Administered 2024-06-29: 2 g via INTRAVENOUS

## 2024-06-29 MED ORDER — MEDROXYPROGESTERONE ACETATE 150 MG/ML IM SUSP: 150.0000 mg | INTRAMUSCULAR | Status: DC | PRN

## 2024-06-29 MED ORDER — GABAPENTIN 300 MG PO CAPS
300.0000 mg | ORAL_CAPSULE | Freq: Three times a day (TID) | ORAL | Status: DC
  Administered 2024-06-29 – 2024-07-02 (×9): 300 mg via ORAL
  Filled 2024-06-29 (×2): qty 1

## 2024-06-29 MED ORDER — DEXAMETHASONE SOD PHOSPHATE PF 10 MG/ML IJ SOLN
INTRAMUSCULAR | Status: DC | PRN
  Administered 2024-06-29 (×2): 5 mg via INTRAVENOUS

## 2024-06-29 MED ORDER — PHENYLEPHRINE HCL-NACL 20-0.9 MG/250ML-% IV SOLN
INTRAVENOUS | Status: AC
  Filled 2024-06-29: qty 250

## 2024-06-29 MED ORDER — SODIUM CHLORIDE 0.9 % IR SOLN
Status: DC | PRN
  Administered 2024-06-29: 1000 mL

## 2024-06-29 MED ORDER — INSULIN NPH (HUMAN) (ISOPHANE) 100 UNIT/ML ~~LOC~~ SUSP
5.0000 [IU] | Freq: Two times a day (BID) | SUBCUTANEOUS | Status: DC
  Administered 2024-06-29 – 2024-06-30 (×2): 5 [IU] via SUBCUTANEOUS
  Filled 2024-06-29: qty 10

## 2024-06-29 MED ORDER — MORPHINE SULFATE (PF) 0.5 MG/ML IJ SOLN
INTRAMUSCULAR | Status: AC
  Filled 2024-06-29: qty 10

## 2024-06-29 MED ORDER — TRANEXAMIC ACID-NACL 1000-0.7 MG/100ML-% IV SOLN
INTRAVENOUS | Status: AC
  Filled 2024-06-29: qty 100

## 2024-06-29 MED ORDER — SOD CITRATE-CITRIC ACID 500-334 MG/5ML PO SOLN
ORAL | Status: AC
  Filled 2024-06-29: qty 30

## 2024-06-29 MED ORDER — OXYTOCIN-SODIUM CHLORIDE 30-0.9 UT/500ML-% IV SOLN
INTRAVENOUS | Status: DC | PRN
  Administered 2024-06-29: 999 mL/h via INTRAVENOUS

## 2024-06-29 MED ORDER — PHENYLEPHRINE HCL-NACL 20-0.9 MG/250ML-% IV SOLN
INTRAVENOUS | Status: DC | PRN
  Administered 2024-06-29: 60 ug/min via INTRAVENOUS

## 2024-06-29 MED ORDER — SCOPOLAMINE 1 MG/3DAYS TD PT72
1.0000 | MEDICATED_PATCH | Freq: Once | TRANSDERMAL | Status: AC
  Administered 2024-06-29: 1 mg via TRANSDERMAL

## 2024-06-29 MED ORDER — PRENATAL MULTIVITAMIN CH
1.0000 | ORAL_TABLET | Freq: Every day | ORAL | Status: DC
  Administered 2024-06-30 – 2024-07-02 (×3): 1 via ORAL

## 2024-06-29 MED ORDER — ACETAMINOPHEN 325 MG PO TABS: 650.0000 mg | ORAL_TABLET | ORAL | Status: DC | PRN

## 2024-06-29 MED ORDER — ACETAMINOPHEN 10 MG/ML IV SOLN
INTRAVENOUS | Status: AC
  Filled 2024-06-29: qty 100

## 2024-06-29 MED ORDER — SCOPOLAMINE 1 MG/3DAYS TD PT72
MEDICATED_PATCH | TRANSDERMAL | Status: AC
  Filled 2024-06-29: qty 1

## 2024-06-29 MED ORDER — OXYCODONE HCL 5 MG PO TABS: 5.0000 mg | ORAL_TABLET | Freq: Once | ORAL | Status: DC | PRN

## 2024-06-29 MED ORDER — ONDANSETRON HCL 4 MG/2ML IJ SOLN: 4.0000 mg | Freq: Three times a day (TID) | INTRAMUSCULAR | Status: DC | PRN

## 2024-06-29 MED ORDER — KETOROLAC TROMETHAMINE 30 MG/ML IJ SOLN
30.0000 mg | Freq: Four times a day (QID) | INTRAMUSCULAR | Status: AC
  Administered 2024-06-29 – 2024-06-30 (×4): 30 mg via INTRAVENOUS
  Filled 2024-06-29 (×2): qty 1

## 2024-06-29 MED ORDER — SENNOSIDES-DOCUSATE SODIUM 8.6-50 MG PO TABS: 2.0000 | ORAL_TABLET | Freq: Every day | ORAL | Status: DC

## 2024-06-29 MED ORDER — METFORMIN HCL ER 500 MG PO TB24
1000.0000 mg | ORAL_TABLET | Freq: Two times a day (BID) | ORAL | Status: DC
  Administered 2024-06-29 – 2024-07-02 (×6): 1000 mg via ORAL
  Filled 2024-06-29 (×2): qty 2

## 2024-06-29 MED ORDER — INSULIN LISPRO (1 UNIT DIAL) 100 UNIT/ML (KWIKPEN): 5.0000 [IU] | PEN_INJECTOR | Freq: Three times a day (TID) | SUBCUTANEOUS | Status: DC

## 2024-06-29 MED ORDER — INSULIN ASPART 100 UNIT/ML IJ SOLN
5.0000 [IU] | Freq: Three times a day (TID) | INTRAMUSCULAR | Status: DC
  Administered 2024-06-29 – 2024-06-30 (×4): 5 [IU] via SUBCUTANEOUS
  Filled 2024-06-29: qty 5

## 2024-06-29 MED ORDER — STERILE WATER FOR IRRIGATION IR SOLN
Status: DC | PRN
  Administered 2024-06-29: 1000 mL

## 2024-06-29 MED ORDER — OXYCODONE HCL 5 MG PO TABS
5.0000 mg | ORAL_TABLET | ORAL | Status: DC | PRN
  Administered 2024-06-30 (×2): 5 mg via ORAL
  Administered 2024-07-01: 10 mg via ORAL
  Administered 2024-07-01: 5 mg via ORAL
  Administered 2024-07-02: 10 mg via ORAL

## 2024-06-29 MED ORDER — FENTANYL CITRATE (PF) 100 MCG/2ML IJ SOLN
INTRAMUSCULAR | Status: DC | PRN
  Administered 2024-06-29: 15 ug via INTRATHECAL

## 2024-06-29 MED ORDER — SOD CITRATE-CITRIC ACID 500-334 MG/5ML PO SOLN
30.0000 mL | ORAL | Status: AC
  Administered 2024-06-29: 30 mL via ORAL

## 2024-06-29 MED ORDER — KETOROLAC TROMETHAMINE 30 MG/ML IJ SOLN
INTRAMUSCULAR | Status: AC
  Filled 2024-06-29: qty 1

## 2024-06-29 MED ORDER — HYDROMORPHONE HCL 1 MG/ML IJ SOLN: 0.2500 mg | INTRAMUSCULAR | Status: DC | PRN

## 2024-06-29 MED ORDER — SODIUM CHLORIDE 0.9 % IV SOLN
500.0000 mg | INTRAVENOUS | Status: AC
  Administered 2024-06-29: 500 mg via INTRAVENOUS

## 2024-06-29 MED ORDER — DIBUCAINE (PERIANAL) 1 % EX OINT: 1.0000 | TOPICAL_OINTMENT | CUTANEOUS | Status: DC | PRN

## 2024-06-29 MED ORDER — SIMETHICONE 80 MG PO CHEW: 80.0000 mg | CHEWABLE_TABLET | ORAL | Status: DC | PRN

## 2024-06-29 MED ORDER — TRANEXAMIC ACID-NACL 1000-0.7 MG/100ML-% IV SOLN
INTRAVENOUS | Status: DC | PRN
  Administered 2024-06-29: 1000 mg via INTRAVENOUS

## 2024-06-29 MED ORDER — MORPHINE SULFATE (PF) 0.5 MG/ML IJ SOLN
INTRAMUSCULAR | Status: DC | PRN
  Administered 2024-06-29: 150 ug via INTRATHECAL

## 2024-06-29 MED ORDER — MENTHOL 3 MG MT LOZG: 1.0000 | LOZENGE | OROMUCOSAL | Status: DC | PRN

## 2024-06-29 MED ORDER — ONDANSETRON HCL 4 MG/2ML IJ SOLN
INTRAMUSCULAR | Status: DC | PRN
  Administered 2024-06-29: 4 mg via INTRAVENOUS

## 2024-06-29 MED ORDER — INSULIN NPH (HUMAN) (ISOPHANE) 100 UNIT/ML ~~LOC~~ SUSP: 5.0000 [IU] | Freq: Two times a day (BID) | SUBCUTANEOUS | Status: DC

## 2024-06-29 MED ORDER — ONDANSETRON HCL 4 MG/2ML IJ SOLN: 4.0000 mg | Freq: Once | INTRAMUSCULAR | Status: DC | PRN

## 2024-06-29 MED ORDER — OXYTOCIN-SODIUM CHLORIDE 30-0.9 UT/500ML-% IV SOLN: 2.5000 [IU]/h | INTRAVENOUS | Status: AC

## 2024-06-29 MED ORDER — DIPHENHYDRAMINE HCL 50 MG/ML IJ SOLN: 12.5000 mg | INTRAMUSCULAR | Status: DC | PRN

## 2024-06-29 MED ORDER — MEASLES, MUMPS & RUBELLA VAC ~~LOC~~ SUSR: 0.5000 mL | Freq: Once | SUBCUTANEOUS | Status: DC

## 2024-06-29 MED ORDER — CEFAZOLIN SODIUM-DEXTROSE 2-4 GM/100ML-% IV SOLN: 2.0000 g | INTRAVENOUS | Status: DC

## 2024-06-29 MED ORDER — LACTATED RINGERS IV SOLN: INTRAVENOUS | Status: DC

## 2024-06-29 MED ORDER — LACTATED RINGERS IV SOLN: INTRAVENOUS | Status: DC | PRN

## 2024-06-29 MED ORDER — NALOXONE HCL 4 MG/10ML IJ SOLN: 1.0000 ug/kg/h | INTRAVENOUS | Status: DC | PRN

## 2024-06-29 MED ORDER — BETAMETHASONE SOD PHOS & ACET 6 (3-3) MG/ML IJ SUSP
12.0000 mg | Freq: Once | INTRAMUSCULAR | Status: AC
  Administered 2024-06-29: 12 mg via INTRAMUSCULAR

## 2024-06-29 MED ORDER — WITCH HAZEL-GLYCERIN EX PADS: 1.0000 | MEDICATED_PAD | CUTANEOUS | Status: DC | PRN

## 2024-06-29 MED ORDER — TERBUTALINE SULFATE 1 MG/ML IJ SOLN
INTRAMUSCULAR | Status: AC
  Filled 2024-06-29: qty 1

## 2024-06-29 MED ORDER — ENOXAPARIN SODIUM 40 MG/0.4ML IJ SOSY
40.0000 mg | PREFILLED_SYRINGE | INTRAMUSCULAR | Status: DC
  Administered 2024-06-30 – 2024-07-02 (×3): 40 mg via SUBCUTANEOUS

## 2024-06-29 MED ORDER — ACETAMINOPHEN 10 MG/ML IV SOLN
INTRAVENOUS | Status: DC | PRN
  Administered 2024-06-29: 1000 mg via INTRAVENOUS

## 2024-06-29 MED ORDER — DIPHENHYDRAMINE HCL 25 MG PO CAPS: 25.0000 mg | ORAL_CAPSULE | ORAL | Status: DC | PRN

## 2024-06-29 MED ORDER — CEFAZOLIN SODIUM-DEXTROSE 2-4 GM/100ML-% IV SOLN
INTRAVENOUS | Status: AC
  Filled 2024-06-29: qty 100

## 2024-06-29 MED ORDER — SODIUM CHLORIDE 0.9% FLUSH: 3.0000 mL | INTRAVENOUS | Status: DC | PRN

## 2024-06-29 SURGICAL SUPPLY — 1 items: NDL HYPO 25X5/8 SAFETYGLIDE (NEEDLE) IMPLANT

## 2024-06-29 NOTE — Lactation Note (Signed)
 This note was copied from a baby's chart.  NICU Lactation Consultation Note  Patient Name: Pamela Alvarado Today's Date: 06/29/2024 Age:36 hours  Reason for consult: Initial assessment; Preterm <34wks; Infant < 5lbs; NICU baby; Multiple gestation; Maternal endocrine disorder; Other (Comment) Type of Endocrine Disorder?: Diabetes (DM2 (insulin ))  SUBJECTIVE Visited with family of 25 36/97 weeks old NICU twin female Boy B (parents have not decided on name yet); [name] is a P4 and experienced breastfeeding but her last child at home is now 36 y.o. She reported that her plan is to primarily breastfeed both babies.   Assisted with breast massage and hand expression (see maternal assessment) prior to pumping. This LC and LC student Kyria set up a DEBP; she initiated pumping during [facility] consult, praised her for all her efforts. Reviewed pumping schedule, pumping log, pump settings, secretory activation, CDC and anticipatory guidelines. Explained that the importance of pumping this early on is mainly for breast stimulation and not to get volume, she voiced understanding.  OBJECTIVE Infant data: Mother's Current Feeding Choice: -- (NPO)  O2 Device: CPAP FiO2 (%): 21 %  Maternal data: [id number] C-Section, Low Transverse Has patient been taught Hand Expression?: Yes Hand Expression Comments: no colostrum noted yet Significant Breast History:: (+) breast changes during the pregnancy Current breast feeding challenges:: NICU admission Does the patient have breastfeeding experience prior to this delivery?: Yes How long did the patient breastfeed?: 1st one 13 months, 2nd 2 months and 3rd one year Pumping frequency: initiated pumping at 4 hours post-partum Pumped volume: 0 mL Flange Size: 21 Risk factor for low/delayed milk supply:: C/S, DM2, AMA, gHTN, prematurity, < 5 lbs, infant separation  WIC Program: Yes WIC Referral Sent?: Yes What county?:  [place]  ASSESSMENT Infant: Feeding Status: NPO  Maternal: Milk volume: Normal Both nipples are inverted but the L one is more pronounced  INTERVENTIONS/PLAN Interventions: Interventions: Breast feeding basics reviewed; Breast massage; Hand express; Coconut oil; DEBP; Education; [employer] Services brochure; CDC milk storage guidelines; CDC Guidelines for Breast Pump Cleaning; NICU Pumping Log Tools: Pump; Flanges; Coconut oil Pump Education: Setup, frequency, and cleaning; Milk Storage  Plan: STS once able to Breast massage, hand expression and coconut oil prior to pumping Pump both breasts on initiate mode every 3 hours for 15 minutes, ideally 8 pumping sessions/24 hours  FOB present and supportive. All questions and concerns answered, family to contact [facility] services PRN.  Consult Status: NICU follow-up NICU Follow-up type: New admission follow up   Pamela Alvarado 06/29/2024, 5:29 PM

## 2024-06-29 NOTE — Anesthesia Procedure Notes (Signed)
 Spinal  Patient location during procedure: OB End time: 06/29/2024 11:28 AM Reason for block: surgical anesthesia  Staffing Performed: anesthesiologist  Authorized by: Jefm Garnette LABOR, MD   Performed by: Jefm Garnette LABOR, MD  Preanesthetic Checklist Completed: patient identified, IV checked, risks and benefits discussed, surgical consent, monitors and equipment checked, pre-op evaluation and timeout performed Spinal Block Patient position: sitting Prep: DuraPrep and site prepped and draped Patient monitoring: heart rate, cardiac monitor, continuous pulse ox and blood pressure Approach: midline Location: L3-4 Injection technique: single-shot Needle Needle type: Pencan  Needle gauge: 24 G Needle length: 10 cm Needle insertion depth (cm): 6 Assessment Sensory level: T4 Events: CSF return  Additional Notes  1 Attempt (s). Pt tolerated procedure well.

## 2024-06-29 NOTE — H&P (Signed)
 Late entry for H&P around 11AM  OBSTETRIC ADMISSION HISTORY AND PHYSICAL  Pamela Alvarado is a 38 y.o. female [phone] with IUP at [redacted]w[redacted]d by early US  with mo/di twins presenting for labor. She reports +FMs.  Pt noted to have painful contractions, seen yesterday in MAU and returned due to worsening of symptoms.  No LOF, no vaginal bleeding. +FM She received her prenatal [facility]   Dating: By early US  --->  Estimated Date of Delivery: 08/31/24  Sono:   Last US  11/25: Twin A vertex/EFW: 881g (28%), Twin B vertex/EFW 804g (11%0 Normal discordance 9%   Prenatal History/Complications:   1) mono/di twins- followed by MFM 2) Class BDM- currently on 12u NPH bid and 12 units regular with meals 3) Gestational HTN no meds  Past Medical History: Past Medical History:  Diagnosis Date   Cellulitis 08/28/2020   DM (diabetes mellitus) (HCC) 08/29/2020   Gestational hypertension    First pregnancy   Leg laceration 08/20/2020   Ovarian cyst 07/03/2002   in [place]   Type 2 diabetes mellitus with obesity 07/19/2021    Past Surgical History: Past Surgical History:  Procedure Laterality Date   CHOLECYSTECTOMY, LAPAROSCOPIC     I & D EXTREMITY Right 08/20/2020   Procedure: IRRIGATION AND DEBRIDEMENT EXTREMITY and closure of leg wound;  Surgeon: Beverley Evalene BIRCH, MD;  Location: [facility] OR;  Service: Orthopedics;  Laterality: Right;  IRRIGATION AND DEBRIDEMENT EXTREMITY and closure of leg wound   NO PAST SURGERIES     WISDOM TOOTH EXTRACTION Bilateral     Obstetrical History: OB History     Gravida  6   Para  3   Term  3   Preterm  0   AB  2   Living  3      SAB  2   IAB  0   Ectopic  0   Multiple      Live Births  3        Obstetric Comments  Pt states with 1st Preg had be Induced due to low amniotic fluid and high BP, she couldn't really explain what happened          Social History Social History   Socioeconomic History   Marital status: Married     Spouse name: Not on file   Number of children: Not on file   Years of education: Not on file   Highest education level: Not on file  Occupational History   Not on file  Tobacco Use   Smoking status: Never   Smokeless tobacco: Never  Substance and Sexual Activity   Alcohol  use: No   Drug use: No   Sexual activity: Yes    Birth control/protection: None  Other Topics Concern   Not on file  Social History Narrative   ** Merged History Encounter **       Social Drivers of Health   Tobacco Use: Low Risk (06/28/2024)   Patient History    Smoking Tobacco Use: Never    Smokeless Tobacco Use: Never    Passive Exposure: Not on file  Financial Resource Strain: Not on file  Food Insecurity: Not on file  Transportation Needs: Not on file  Physical Activity: Not on file  Stress: Not on file  Social Connections: Not on file  Depression (EYV7-0): Medium Risk (02/14/2024)   Depression (PHQ2-9)    PHQ-2 Score: 6  Alcohol  Screen: Not on file  Housing: Not on file  Utilities: Not on file  Health Literacy: Not on file    Family History: Family History  Problem Relation Age of Onset   Diabetes Maternal Grandmother    Tuberculosis Maternal Grandfather    Diabetes Paternal Grandmother    Diabetes Paternal Grandfather    Asthma Neg Hx    Cancer Neg Hx    Heart disease Neg Hx    Hypertension Neg Hx     Allergies: Allergies[1]  Medications Prior to Admission  Medication Sig Dispense Refill Last Dose/Taking   aspirin  81 MG chewable tablet Chew 1 tablet (81 mg total) by mouth daily. Start after 12 weeks 60 tablet 3    Blood Glucose Monitoring Suppl (TRUE METRIX METER) w/Device KIT Use to check blood sugar once daily. 1 kit 0    cyclobenzaprine  (FLEXERIL ) 10 MG tablet Take 1 tablet (10 mg total) by mouth 3 (three) times daily as needed for muscle spasms. 30 tablet 2    glucose blood (TRUE METRIX BLOOD GLUCOSE TEST) test strip Use to check blood sugar once daily. 100 each 2    insulin   lispro (HUMALOG ) 100 UNIT/ML KwikPen Inject 11 Units into the skin 3 (three) times daily. (Patient taking differently: Inject 14 Units into the skin 3 (three) times daily.) 15 mL 11    insulin  NPH Human (NOVOLIN N) 100 UNIT/ML injection Inject 0.1 mLs (10 Units total) into the skin 2 (two) times daily before a meal. (Patient taking differently: Inject 12 Units into the skin 2 (two) times daily before a meal.) 10 mL 11    Insulin  Pen Needle (PEN NEEDLES) 31G X 5 MM MISC Use with insulin . 100 each 11    Insulin  Syringe-Needle U-100 (INSULIN  SYRINGE .3CC/31GX5/16) 31G X 5/16 0.3 ML MISC Use as directed. 100 each 11    metFORMIN  (GLUCOPHAGE -XR) 500 MG 24 hr tablet Take 2 tablets (1,000 mg total) by mouth 2 (two) times daily with a meal. 120 tablet 6    NIFEdipine  (PROCARDIA ) 20 MG capsule Take 1 capsule (20 mg total) by mouth 3 (three) times daily. 90 capsule 0    nystatin -triamcinolone  ointment (MYCOLOG) Apply 1 application topically 2 (two) times daily. (Patient not taking: Reported on 06/23/2024) 30 g 0    ondansetron  (ZOFRAN -ODT) 4 MG disintegrating tablet Take 1 tablet (4 mg total) by mouth every 6 (six) hours as needed for nausea. (Patient not taking: Reported on 06/23/2024) 20 tablet 0    Prenatal Vit-Fe Fumarate-FA (MULTIVITAMIN-PRENATAL) 27-0.8 MG TABS tablet Take 1 tablet by mouth daily at 12 noon.      terconazole  (TERAZOL 7 ) 0.4 % vaginal cream Place 1 applicator vaginally at bedtime. Use for seven days 45 g 0    TRUEplus Lancets 28G MISC Use to check blood sugar once daily. 100 each 1      Review of Systems   All systems reviewed and negative except as stated in HPI  Blood pressure (!) 177/100, pulse 86, last menstrual period 10/25/2023, SpO2 100%, unknown if currently breastfeeding. General appearance: alert, cooperative, and no distress Lungs: clear to auscultation bilaterally Heart: regular rate and rhythm Abdomen: soft, non-tender; bowel sounds normal Pelvic: per MAU  team Extremities: Homans sign is negative, no sign of DVT Twin A: 145bpm, moderate variability Twin B: 145 bpm, moderate variability  BSUS: Twin A: complete breech, head maternal left, Twin B: vertex  Dilation: 8   Prenatal labs: ABO, Rh: --/--/O POS (12/28 1059) Antibody: NEG (12/28 1059) Rubella: 1.45 (08/14 1000) RPR: Non Reactive (12/09 1158)  HBsAg: Negative (08/14 1000)  HIV: Non Reactive (12/09 1158)  GBS:      Lab Results  Component Value Date   GBS Negative 08/26/2015    Immunization History  Administered Date(s) Administered   Influenza, Seasonal, Injecte, Preservative Fre 05/20/2024   Influenza,inj,Quad PF,6+ Mos 07/19/2021   PFIZER(Purple Top)SARS-COV-2 Vaccination 10/28/2019, 11/24/2019   PNEUMOCOCCAL CONJUGATE-20 07/19/2021   Tdap 08/20/2020, 06/10/2024    Prenatal Transfer Tool  Maternal Diabetes: Yes:  Diabetes Type:  Insulin /Medication controlled Genetic Screening: Normal Maternal Ultrasounds/Referrals: Normal Fetal Ultrasounds or other Referrals:  Referred to Materal Fetal Medicine  Maternal Substance Abuse:  No Significant Maternal Medications:  Meds include: Other:  insulin  Significant Maternal Lab Results: Other:  GBS unknown Number of Prenatal Visits:greater than 3 verified prenatal visits Maternal Vaccinations:TDap and Flu Other Comments:  None   Results for orders placed or performed during the hospital encounter of 06/29/24 (from the past 24 hours)  Type and screen [facility]   Collection Time: 06/29/24 10:59 AM  Result Value Ref Range   ABO/RH(D) O POS    Antibody Screen NEG    Sample Expiration      07/02/2024,2359 Performed at [facility], [address] [place], [place] [address]   CBC   Collection Time: 06/29/24 11:13 AM  Result Value Ref Range   WBC 13.0 (H) 4.0 - 10.5 K/uL   RBC 5.22 (H) 3.87 - 5.11 MIL/uL   Hemoglobin 15.6 (H) 12.0 - 15.0 g/dL   HCT 55.8 63.9 - 53.9 %   MCV 84.5 80.0 - 100.0 fL    MCH 29.9 26.0 - 34.0 pg   MCHC 35.4 30.0 - 36.0 g/dL   RDW 87.3 88.4 - 84.4 %   Platelets 317 150 - 400 K/uL   nRBC 0.0 0.0 - 0.2 %    Patient Active Problem List   Diagnosis Date Noted   Fetal malpresentation 06/29/2024   Pregnancy 06/29/2024   History of gestational hypertension 06/10/2024   AMA (advanced maternal age) multigravida 35+ 03/07/2024   Obesity affecting pregnancy, antepartum 03/07/2024   Language barrier 02/26/2024   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 [id number] at [redacted]w[redacted]d here for preterm labor with bulging bag with fetal malpresentation of Twin A  -plan for primary C-section -OR notified -see separate note regarding consent -NPO - LR @ 125cc/hr -will notify NICU  Pamela CHRISTELLA Prude, DO  06/29/2024, 12:29 PM      [1] No Known Allergies

## 2024-06-29 NOTE — Plan of Care (Signed)

## 2024-06-29 NOTE — Op Note (Signed)
 Marca Code Sixtega PROCEDURE DATE: 06/29/2024  PREOPERATIVE DIAGNOSES: Intrauterine pregnancy at [redacted]w[redacted]d weeks gestation; malpresentation: complete breech, preterm labor  POSTOPERATIVE DIAGNOSES: The same  PROCEDURE: Primary Low Transverse Cesarean Section  SURGEON:  Dr. Jenny Kalik Hoare  ASSISTANT:  Dr. Jomarie   ANESTHESIOLOGY TEAM: Anesthesiologist: Jefm Garnette LABOR, MD CRNA: Steen Clarita CROME, CRNA  INDICATIONS: Pamela Alvarado is a 38 y.o. [phone] at [redacted]w[redacted]d here for cesarean section secondary to the indications listed under preoperative diagnoses; please see preoperative note for further details.  The risks of surgery were discussed with the patient including but were not limited to: bleeding which may require transfusion or reoperation; infection which may require antibiotics; injury to bowel, bladder, ureters or other surrounding organs; injury to the fetus; need for additional procedures including hysterectomy in the event of a life-threatening hemorrhage; formation of adhesions; placental abnormalities wth subsequent pregnancies; incisional problems; thromboembolic phenomenon and other postoperative/anesthesia complications.  The patient concurred with the proposed plan, giving informed written consent for the procedure.    FINDINGS:  TWIN A: Viable female infant in complete breech presentation.  Apgars pending  Amniotic fluid: clear.  Intact placenta, three vessel cord. TWIN B: Viable female infant from cephalic presentation.  Apgars pending.   Normal uterus, fallopian tubes and ovaries bilaterally.  ANESTHESIA: spinal INTRAVENOUS FLUIDS: 2000 ml   ESTIMATED BLOOD LOSS: 500 ml URINE OUTPUT:  350 ml SPECIMENS: Placenta sent to pathology  COMPLICATIONS: None immediate  PROCEDURE IN DETAIL:  The patient preoperatively received intravenous antibiotics and had sequential compression devices applied to her lower extremities.  She was then taken to the operating room where spinal  anesthesia was found to be adequate. She was then placed in a dorsal supine position with a leftward tilt, and prepped and draped in a sterile manner.  A foley catheter was  placed into her bladder and attached to constant gravity.  After an adequate timeout was performed, a Pfannenstiel skin incision was made with scalpel and carried through to the underlying layer of fascia. The fascia was incised in the midline, and this incision was extended bluntly. The rectus muscles were separated in the midline and the peritoneum was entered bluntly.   The Alexis self-retaining retractor was introduced into the abdominal cavity.  Vesicouterine flap was created using Metzenbaum scissors.  Attention was turned to the lower uterine segment where a low transverse hysterotomy was made with a scalpel and extended bilaterally bluntly.  Twin A was delivered from complete breech presentation.  The infant was successfully delivered, the cord was clamped and cut after one minute, and the infant was handed over to the awaiting neonatology team. The cord was marked with a clamp.  Twin B was successfully delivered from cephalic presentation, the cord was clamped and cut after one minute, and the infant was handed over to the awaiting neonatology team Uterine massage was then administered, and the placenta delivered intact with a three-vessel cord. The uterus was then cleared of clots and debris.  The hysterotomy was closed with 0 Vicryl in a running fashion.  A figure-of-eight 0 Vicryl serosal stitches were placed to help with hemostasis.    The pelvis was cleared of all clot and debris. Hemostasis was confirmed on all surfaces.  The retractor was removed.  The peritoneum was closed with a 2-0 Vicryl running stitch. The fascia was then closed using 0 Vicryl in a running fashion.  The subcutaneous layer was irrigated, was reapproximated with 2-0 plain gut in a running fashion. The skin  was closed with a 4-0 monocryl subcuticular  stitch. The patient tolerated the procedure well. Sponge, instrument and needle counts were correct x 3.  She was taken to the recovery room in stable condition.   Kiyani Jernigan, DO Attending Obstetrician & Gynecologist, [facility] for [employer], [facility]

## 2024-06-29 NOTE — Progress Notes (Addendum)
 Called to bedside due to bulging bag, unable to appreciate fetal presentation.  BSUS completed- complete breech presentation.  Head on maternal left.  FHT reassuring x 2.  Due to advanced cervical dilation/preterm labor and fetal malpresentation, recommendation to proceed with primary C-section.  The risks of surgery were discussed with the patient including but were not limited to: bleeding which may require transfusion or reoperation; infection which may require antibiotics; injury to bowel, bladder, ureters or other surrounding organs; injury to the fetus; need for additional procedures including hysterectomy in the event of a life-threatening hemorrhage; formation of adhesions; placental abnormalities with subsequent pregnancies; incisional problems; thromboembolic phenomenon and other postoperative/anesthesia complications.  The patient concurred with the proposed plan, giving informed written consent for the procedure.    Pt had a fruit drink around 8am today.   Anesthesia and OR aware. Preoperative prophylactic antibiotics and SCDs ordered on call to the OR. CBC, T&S collected.  Plan for BMZ and Terb now. To OR when ready.  Christana Angelica, DO Attending Obstetrician & Gynecologist, [facility] for [employer], [facility]

## 2024-06-29 NOTE — Plan of Care (Signed)
  Problem: Education: Goal: Knowledge of General Education information will improve Description: Including pain rating scale, medication(s)/side effects and non-pharmacologic comfort measures Outcome: Progressing   Problem: Health Behavior/Discharge Planning: Goal: Ability to manage health-related needs will improve Outcome: Progressing   Problem: Clinical Measurements: Goal: Ability to maintain clinical measurements within normal limits will improve Outcome: Progressing Goal: Will remain free from infection Outcome: Progressing Goal: Diagnostic test results will improve Outcome: Progressing Goal: Respiratory complications will improve Outcome: Progressing Goal: Cardiovascular complication will be avoided Outcome: Progressing   Problem: Activity: Goal: Risk for activity intolerance will decrease Outcome: Progressing   Problem: Nutrition: Goal: Adequate nutrition will be maintained Outcome: Progressing   Problem: Coping: Goal: Level of anxiety will decrease Outcome: Progressing   Problem: Elimination: Goal: Will not experience complications related to bowel motility Outcome: Progressing Goal: Will not experience complications related to urinary retention Outcome: Progressing   Problem: Pain Managment: Goal: General experience of comfort will improve and/or be controlled Outcome: Progressing   Problem: Safety: Goal: Ability to remain free from injury will improve Outcome: Progressing   Problem: Skin Integrity: Goal: Risk for impaired skin integrity will decrease Outcome: Progressing   Problem: Education: Goal: Knowledge of condition will improve Outcome: Progressing Goal: Individualized Educational Video(s) Outcome: Progressing Goal: Individualized Newborn Educational Video(s) Outcome: Progressing   Problem: Activity: Goal: Will verbalize the importance of balancing activity with adequate rest periods Outcome: Progressing Goal: Ability to tolerate increased  activity will improve Outcome: Progressing   Problem: Coping: Goal: Ability to identify and utilize available resources and services will improve Outcome: Progressing   Problem: Life Cycle: Goal: Chance of risk for complications during the postpartum period will decrease Outcome: Progressing   Problem: Role Relationship: Goal: Ability to demonstrate positive interaction with newborn will improve Outcome: Progressing   Problem: Skin Integrity: Goal: Demonstration of wound healing without infection will improve Outcome: Progressing   Problem: Education: Goal: Ability to describe self-care measures that may prevent or decrease complications (Diabetes Survival Skills Education) will improve Outcome: Progressing Goal: Individualized Educational Video(s) Outcome: Progressing   Problem: Coping: Goal: Ability to adjust to condition or change in health will improve Outcome: Progressing   Problem: Fluid Volume: Goal: Ability to maintain a balanced intake and output will improve Outcome: Progressing   Problem: Health Behavior/Discharge Planning: Goal: Ability to identify and utilize available resources and services will improve Outcome: Progressing Goal: Ability to manage health-related needs will improve Outcome: Progressing   Problem: Metabolic: Goal: Ability to maintain appropriate glucose levels will improve Outcome: Progressing   Problem: Nutritional: Goal: Maintenance of adequate nutrition will improve Outcome: Progressing Goal: Progress toward achieving an optimal weight will improve Outcome: Progressing   Problem: Skin Integrity: Goal: Risk for impaired skin integrity will decrease Outcome: Progressing   Problem: Tissue Perfusion: Goal: Adequacy of tissue perfusion will improve Outcome: Progressing   Problem: Education: Goal: Knowledge of the prescribed therapeutic regimen will improve Outcome: Progressing Goal: Understanding of sexual limitations or changes  related to disease process or condition will improve Outcome: Progressing Goal: Individualized Educational Video(s) Outcome: Progressing   Problem: Self-Concept: Goal: Communication of feelings regarding changes in body function or appearance will improve Outcome: Progressing   Problem: Skin Integrity: Goal: Demonstration of wound healing without infection will improve Outcome: Progressing

## 2024-06-29 NOTE — Anesthesia Preprocedure Evaluation (Addendum)
"                                    Anesthesia Evaluation  Patient identified by MRN, date of birth, ID band Patient awake    Reviewed: Allergy & Precautions, NPO status , Patient's Chart, lab work & pertinent test results  Airway Mallampati: II  TM Distance: >3 FB Neck ROM: Full    Dental no notable dental hx. (+) Teeth Intact   Pulmonary neg pulmonary ROS   Pulmonary exam normal breath sounds clear to auscultation       Cardiovascular hypertension, Normal cardiovascular exam Rhythm:Regular Rate:Normal     Neuro/Psych negative neurological ROS  negative psych ROS   GI/Hepatic negative GI ROS, Neg liver ROS,,,  Endo/Other  diabetes, Type 2    Renal/GU negative Renal ROS     Musculoskeletal   Abdominal   Peds  Hematology   Anesthesia Other Findings NKDA  Reproductive/Obstetrics (+) Pregnancy                              Anesthesia Physical Anesthesia Plan  ASA: 2 and emergent  Anesthesia Plan: Spinal   Post-op Pain Management: Toradol  IV (intra-op)* and Tylenol  PO (pre-op)*   Induction:   PONV Risk Score and Plan: Treatment may vary due to age or medical condition, Ondansetron  and Dexamethasone   Airway Management Planned: Natural Airway and Nasal Cannula  Additional Equipment:   Intra-op Plan:   Post-operative Plan:   Informed Consent:      Dental advisory given and Interpreter used for interview  Plan Discussed with:   Anesthesia Plan Comments: (30.2 Wk G6P3  with twins one breech  Spanish speaking for urgent C/S)         Anesthesia Quick Evaluation  "

## 2024-06-29 NOTE — Anesthesia Postprocedure Evaluation (Signed)
"   Anesthesia Post Note  Patient: Pamela Alvarado  Procedure(s) Performed: CESAREAN DELIVERY (Abdomen)     Patient location during evaluation: Mother Baby Anesthesia Type: Spinal Level of consciousness: oriented and awake and alert Pain management: pain level controlled Vital Signs Assessment: post-procedure vital signs reviewed and stable Respiratory status: spontaneous breathing and respiratory function stable Cardiovascular status: blood pressure returned to baseline and stable Postop Assessment: no headache, no backache, no apparent nausea or vomiting and able to ambulate Anesthetic complications: no   No notable events documented.  Last Vitals:  Vitals:   06/29/24 1315 06/29/24 1330  BP: (!) 98/50 (!) 99/57  Pulse: 82 64  Resp: 16 16  Temp:    SpO2: 100% 100%    Last Pain:  Vitals:   06/29/24 1330  TempSrc:   PainSc: 0-No pain   Pain Goal:    LLE Motor Response: Purposeful movement (06/29/24 1330) LLE Sensation: Tingling (06/29/24 1330) RLE Motor Response: Purposeful movement (06/29/24 1330) RLE Sensation: Tingling (06/29/24 1330)     Epidural/Spinal Function Cutaneous sensation: Tingles (06/29/24 1330), Patient able to flex knees: Yes (06/29/24 1330), Patient able to lift hips off bed: No (06/29/24 1330), Back pain beyond tenderness at insertion site: No (06/29/24 1330), Progressively worsening motor and/or sensory loss: No (06/29/24 1330), Bowel and/or bladder incontinence post epidural: No (06/29/24 1330)  [name]      "

## 2024-06-29 NOTE — Discharge Summary (Signed)
 "    Postpartum Discharge Summary  Date of Service updated***     Patient Name: Pamela Alvarado DOB: 10/10/1985 MRN: [id number]  Date of admission: 06/29/2024 Delivery date:   Martinez Alvarado, BoyA Tiffane [[id number]]  06/29/2024    Buffey, Zabinski [[id number]]  06/29/2024 Delivering provider:    Code Judeen, SHAYA REDDICK [[id number]]  MARILYNN NEST    Code Judeen, BoyB Jenet [[id number]]  MARILYNN NEST Date of discharge: 06/29/2024  Admitting diagnosis: Fetal malpresentation [O32.9XX0] Pregnancy [Z34.90] Intrauterine pregnancy: [redacted]w[redacted]d     Secondary diagnosis:  Principal Problem:   Fetal malpresentation Active Problems:   Pregnancy  Additional problems: Mono/Di Twins, Class B Diabetes, Gestational HTN ***    Discharge diagnosis: {DX.:23714}                                              Post partum procedures:{Postpartum procedures:23558} Augmentation: N/A Complications: {OB Labor/Delivery Complications:20784}  Hospital course: Onset of Labor With Unplanned C/S   38 y.o. yo [id number] at [redacted]w[redacted]d was admitted in Active Labor on 06/29/2024. In MAU, pt noted to be 8cm with bulging bag, ultrasound noted complete breech presentation.  The patient went for cesarean section due to Malpresentation. Delivery details as follows: Membrane Rupture Time/Date:    Harbor, Paster [[id number]]  11:44 AM    Code Judeen, Katy Marca [[id number]]  11:46 AM,   Sugey, Trevathan [[id number]]  06/29/2024    Rosalinda, Seaman [[id number]]  06/29/2024  Delivery Method:   Code Judeen Norman Marca [[id number]]  C-Section, Low Transverse    Leyah, Bocchino [[id number]]  C-Section, Low Transverse Operative Delivery:N/A Details of operation can be found in separate operative note. Patient had a postpartum course complicated by***.  She is ambulating,tolerating a regular  diet, passing flatus, and urinating well.  Patient is discharged home in stable condition 06/29/2024.  Newborn Data: Birth date:   Breauna, Mazzeo [[id number]]  06/29/2024    Sonna, Lipsky [[id number]]  06/29/2024 Birth time:   Annahi, Short [[id number]]  11:45 AM    Code Judeen Katy Marca [[id number]]  11:47 AM Gender:   Klyn, Kroening [[id number]]  Female    Teniqua, Marron [[id number]]  Female Living status:   Clarissia, Mckeen [[id number]]  Living    [address] Judeen Katy [place] [[id number]]    Apgars:   Haileigh, Pitz [[id number]]  [address] [[id number]]  8 ,   Alonnah, Lampkins [[id number]]  [address] [place] [[id number]]  9  Weight:   Sebrina, Kessner [[id number]]  1590 g    Damoni, Erker [[id number]]     Magnesium  Sulfate received: {Mag received:30440022} BMZ received: 1st dose given Rhophylac:{Rhophylac received:30440032} FFM:{FFM:69559966} T-DaP:Given prenatally Flu: Yes RSV Vaccine received: {RSV:31013} Transfusion:{Transfusion received:30440034}  Immunizations received: Immunization History  Administered Date(s) Administered   Influenza, Seasonal, Injecte, Preservative Fre 05/20/2024   Influenza,inj,Quad PF,6+ Mos 07/19/2021   PFIZER(Purple Top)SARS-COV-2 Vaccination 10/28/2019, 11/24/2019   PNEUMOCOCCAL CONJUGATE-20 07/19/2021   Tdap 08/20/2020, 06/10/2024    Physical exam  Vitals:   06/29/24 1115 06/29/24 1116 06/29/24 1238 06/29/24 1245  BP:  (!) 177/100 (!) 97/45 (!) 93/48  Pulse:  86 79 76  Resp:   16 16  Temp:  97.8 F (36.6 C)   TempSrc:   Oral   SpO2: 100%  99% 100%   General: {Exam; general:21111117} Lochia: {Desc; appropriate/inappropriate:30686::appropriate} Uterine Fundus: {Desc;  firm/soft:30687} Incision: {Exam; incision:21111123} DVT Evaluation: {Exam; icu:7888877} Labs: Lab Results  Component Value Date   WBC 13.0 (H) 06/29/2024   HGB 15.6 (H) 06/29/2024   HCT 44.1 06/29/2024   MCV 84.5 06/29/2024   PLT 317 06/29/2024      Latest Ref Rng & Units 06/10/2024   11:58 AM  CMP  Glucose 70 - 99 mg/dL 833   BUN 6 - 20 mg/dL 7   Creatinine 9.42 - 8.99 mg/dL 9.50   Sodium 865 - 855 mmol/L 138   Potassium 3.5 - 5.2 mmol/L 3.6   Chloride 96 - 106 mmol/L 102   CO2 20 - 29 mmol/L 17   Calcium 8.7 - 10.2 mg/dL 8.3   Total Protein 6.0 - 8.5 g/dL 5.5   Total Bilirubin 0.0 - 1.2 mg/dL 0.7   Alkaline Phos 41 - 116 IU/L 156   AST 0 - 40 IU/L 15   ALT 0 - 32 IU/L 29    Edinburgh Score:     No data to display         No data recorded  After visit meds:  Allergies as of 06/29/2024   No Known Allergies   Med Rec must be completed prior to using this [facility]***        Discharge home in stable condition Infant Feeding: {Baby feeding:23562} Infant Disposition:{CHL IP OB HOME WITH FNUYZM:76418} Discharge instruction: per After Visit Summary and Postpartum booklet. Activity: Advance as tolerated. Pelvic rest for 6 weeks.  Diet: {OB ipzu:78888878} Future Appointments: Future Appointments  Date Time Provider Department Center  07/08/2024  9:15 AM [facility] PROVIDER 1 [facility]  07/08/2024  9:30 AM [facility] US1 [facility]  07/08/2024  1:15 PM Izell Harari, MD [facility]  07/22/2024  1:15 PM Cleatus Moccasin, MD [facility]  08/05/2024  1:15 PM Izell Harari, MD [facility]  08/12/2024  1:15 PM [facility] 1 [facility]  08/19/2024  1:15 PM [facility] 1 [facility]  08/26/2024  1:15 PM [facility] 1 [facility]  09/02/2024  1:15 PM [facility] 1 [facility]   Follow up Visit:   Please schedule this patient for a In person postpartum visit in 1 week with the following provider: Any provider. Additional Postpartum F/U:Incision check 1 week and BP check 1  week  High risk pregnancy complicated by: mono/di twins, Chronic HTN, Class B DM Delivery mode:     Karlisa, Gaubert [[id number]]  C-Section, Low Transverse    Shaniece, Bussa [[id number]]  C-Section, Low Transverse Anticipated Birth Control:  Kurt - was considering BTL Message sent to office 12/28   06/29/2024 Jennifer M Ozan, DO    "

## 2024-06-29 NOTE — Lactation Note (Signed)
 This note was copied from a baby's chart.  NICU Lactation Consultation Note  Patient Name: Pamela Alvarado Today's Date: 06/29/2024 Age:36 hours  Reason for consult: Initial assessment; NICU baby; Infant < 5lbs; Preterm <34wks; Other (Comment); Maternal endocrine disorder; Multiple gestation (gHTN. AMA) Type of Endocrine Disorder?: Diabetes (DM2 (insulin ))  SUBJECTIVE Visited with family of 28 39/65 weeks old NICU twin female Boy A (parents have not decided on name yet); [name] is a P4 and experienced breastfeeding but her last child at home is now 89 y.o. She reported that her plan is to primarily breastfeed both babies.   Assisted with breast massage and hand expression (see maternal assessment) prior to pumping. This LC and LC student Kyria set up a DEBP; she initiated pumping during [facility] consult, praised her for all her efforts. Reviewed pumping schedule, pumping log, pump settings, secretory activation, CDC and anticipatory guidelines. Explained that the importance of pumping this early on is mainly for breast stimulation and not to get volume, she voiced understanding.  OBJECTIVE Infant data: Mother's Current Feeding Choice: -- (NPO)  O2 Device: CPAP FiO2 (%): 21 %  Maternal data: [id number] C-Section, Low Transverse Has patient been taught Hand Expression?: Yes Hand Expression Comments: no colostrum noted yet Significant Breast History:: (+) breast changes during the pregnancy Current breast feeding challenges:: NICU admission Does the patient have breastfeeding experience prior to this delivery?: Yes How long did the patient breastfeed?: 1st one 13 months, 2nd 2 months and 3rd one year Pumping frequency: initiated pumping at 4 hours post-partum Pumped volume: 0 mL Flange Size: 21 Risk factor for low/delayed milk supply:: C/S, DM2, AMA, gHTN, prematurity, < 5 lbs, infant separation  WIC Program: Yes WIC Referral Sent?: Yes What county?:  [place]  ASSESSMENT Infant: Feeding Status: NPO  Maternal: Milk volume: Normal Both nipples are inverted but the L one is more pronounced  INTERVENTIONS/PLAN Interventions: Interventions: Breast feeding basics reviewed; DEBP; Education; [employer] Services brochure; CDC milk storage guidelines; CDC Guidelines for Breast Pump Cleaning; NICU Pumping Log; Breast massage; Hand express Tools: Pump; Flanges; Coconut oil Pump Education: Setup, frequency, and cleaning; Milk Storage  Plan: STS once able to Breast massage, hand expression and coconut oil prior to pumping Pump both breasts on initiate mode every 3 hours for 15 minutes, ideally 8 pumping sessions/24 hours   FOB present and supportive. All questions and concerns answered, family to contact [facility] services PRN.  Consult Status: NICU follow-up NICU Follow-up type: New admission follow up   Kahlin Mark [name] 06/29/2024, 5:29 PM

## 2024-06-29 NOTE — Transfer of Care (Signed)
 Immediate Anesthesia Transfer of Care Note  Patient: Pamela Alvarado  Procedure(s) Performed: CESAREAN DELIVERY (Abdomen)  Patient Location: PACU  Anesthesia Type:Spinal  Level of Consciousness: awake  Airway & Oxygen Therapy: Patient Spontanous Breathing  Post-op Assessment: Report given to RN and Post -op Vital signs reviewed and stable  Post vital signs: Reviewed and stable  Last Vitals:  Vitals Value Taken Time  BP    Temp    Pulse    Resp    SpO2      Last Pain: There were no vitals filed for this visit.       Complications: No notable events documented.

## 2024-06-30 ENCOUNTER — Other Ambulatory Visit: Payer: Self-pay

## 2024-06-30 ENCOUNTER — Ambulatory Visit: Payer: Self-pay | Admitting: Certified Nurse Midwife

## 2024-06-30 ENCOUNTER — Other Ambulatory Visit (HOSPITAL_COMMUNITY): Payer: Self-pay

## 2024-06-30 ENCOUNTER — Encounter (HOSPITAL_COMMUNITY): Payer: Self-pay | Admitting: Obstetrics & Gynecology

## 2024-06-30 DIAGNOSIS — Z6838 Body mass index (BMI) 38.0-38.9, adult: Secondary | ICD-10-CM

## 2024-06-30 LAB — CBC
HCT: 27.9 % — ABNORMAL LOW (ref 36.0–46.0)
Hemoglobin: 10 g/dL — ABNORMAL LOW (ref 12.0–15.0)
MCH: 30.3 pg (ref 26.0–34.0)
MCHC: 35.8 g/dL (ref 30.0–36.0)
MCV: 84.5 fL (ref 80.0–100.0)
Platelets: 244 K/uL (ref 150–400)
RBC: 3.3 MIL/uL — ABNORMAL LOW (ref 3.87–5.11)
RDW: 12.6 % (ref 11.5–15.5)
WBC: 15.9 K/uL — ABNORMAL HIGH (ref 4.0–10.5)
nRBC: 0 % (ref 0.0–0.2)

## 2024-06-30 LAB — COMPREHENSIVE METABOLIC PANEL WITH GFR
ALT: 25 U/L (ref 0–44)
AST: 19 U/L (ref 15–41)
Albumin: 2.6 g/dL — ABNORMAL LOW (ref 3.5–5.0)
Alkaline Phosphatase: 172 U/L — ABNORMAL HIGH (ref 38–126)
Anion gap: 10 (ref 5–15)
BUN: 7 mg/dL (ref 6–20)
CO2: 20 mmol/L — ABNORMAL LOW (ref 22–32)
Calcium: 9 mg/dL (ref 8.9–10.3)
Chloride: 100 mmol/L (ref 98–111)
Creatinine, Ser: 0.52 mg/dL (ref 0.44–1.00)
GFR, Estimated: >60 mL/min
Glucose, Bld: 137 mg/dL — ABNORMAL HIGH (ref 70–99)
Potassium: 4.2 mmol/L (ref 3.5–5.1)
Sodium: 131 mmol/L — ABNORMAL LOW (ref 135–145)
Total Bilirubin: 0.5 mg/dL (ref 0.0–1.2)
Total Protein: 5.5 g/dL — ABNORMAL LOW (ref 6.5–8.1)

## 2024-06-30 LAB — GLUCOSE, CAPILLARY
Glucose-Capillary: 153 mg/dL — ABNORMAL HIGH (ref 70–99)
Glucose-Capillary: 150 mg/dL — ABNORMAL HIGH (ref 70–99)
Glucose-Capillary: 138 mg/dL — ABNORMAL HIGH (ref 70–99)

## 2024-06-30 LAB — SYPHILIS: RPR W/REFLEX TO RPR TITER AND TREPONEMAL ANTIBODIES, TRADITIONAL SCREENING AND DIAGNOSIS ALGORITHM: RPR Ser Ql: NONREACTIVE

## 2024-06-30 MED ORDER — POLYETHYLENE GLYCOL 3350 17 G PO PACK
17.0000 g | PACK | ORAL | Status: DC
  Administered 2024-06-30 – 2024-07-02 (×2): 17 g via ORAL
  Filled 2024-06-30 (×2): qty 1

## 2024-06-30 MED ORDER — SENNOSIDES-DOCUSATE SODIUM 8.6-50 MG PO TABS: 2.0000 | ORAL_TABLET | Freq: Every evening | ORAL | Status: DC | PRN

## 2024-06-30 MED ORDER — INSULIN ASPART PROT & ASPART (70-30 MIX) 100 UNIT/ML ~~LOC~~ SUSP
8.0000 [IU] | Freq: Two times a day (BID) | SUBCUTANEOUS | Status: DC
  Administered 2024-06-30 – 2024-07-02 (×4): 8 [IU] via SUBCUTANEOUS
  Filled 2024-06-30: qty 10

## 2024-06-30 MED ORDER — POTASSIUM CHLORIDE CRYS ER 20 MEQ PO TBCR
20.0000 meq | EXTENDED_RELEASE_TABLET | Freq: Every day | ORAL | Status: DC
  Administered 2024-06-30 – 2024-07-02 (×3): 20 meq via ORAL
  Filled 2024-06-30 (×3): qty 1

## 2024-06-30 MED ORDER — FUROSEMIDE 20 MG PO TABS
20.0000 mg | ORAL_TABLET | Freq: Every day | ORAL | Status: DC
  Administered 2024-06-30 – 2024-07-02 (×3): 20 mg via ORAL
  Filled 2024-06-30 (×3): qty 1

## 2024-06-30 NOTE — Lactation Note (Signed)
 This note was copied from a baby's chart.  NICU Lactation Consultation Note  Patient Name: Pamela Alvarado Unijb'd Date: 06/30/2024 Age:36 hours  Reason for consult: Follow-up assessment; NICU baby; Preterm <34wks; Infant < 5lbs; Other (Comment); Multiple gestation; Maternal endocrine disorder (gHTN, AMA) Type of Endocrine Disorder?: Diabetes (DM2 (insulin ))  SUBJECTIVE Visited with family of 17 48/42 weeks old AGA NICU twin female [name]; [name] reported she's been pumping but still not getting any drops. Re-educated about the normalcy of this pattern at these # of hours post-partum and praised her for all her efforts. SABRA Ezella [name] still on NPO status but NICU team is planning on starting feedings later tonight. Assisted with signing the donor milk consent form with NICU RN [name]. This LC also offered a [facility] loaner pump on discharge in case the [facility] office doesn't have appts available on her discharge day.   OBJECTIVE Infant data: Mother's Current Feeding Choice: -- (NPO)  O2 Device: Si-PAP FiO2 (%): 24 %  Maternal data: [id number] C-Section, Low Transverse Has patient been taught Hand Expression?: Yes Hand Expression Comments: no colostrum noted yet Significant Breast History:: (+) breast changes during the pregnancy Current breast feeding challenges:: NICU admission Does the patient have breastfeeding experience prior to this delivery?: Yes How long did the patient breastfeed?: 1st one 13 months, 2nd 2 months and 3rd one year Pumping frequency: 4 times/24 hours Pumped volume: 0 mL Flange Size: 21 Risk factor for low/delayed milk supply:: C/S, DM2, AMA, gHTN, prematurity, < 5 lbs, infant separation  WIC Program: Yes WIC Referral Sent?: Yes What county?: [place]  ASSESSMENT Infant: Feeding Status: NPO  Maternal: Milk volume: Normal  INTERVENTIONS/PLAN Interventions: Interventions: Breast feeding basics reviewed; Coconut oil; DEBP;  Education Tools: Pump; Flanges; Coconut oil Pump Education: Setup, frequency, and cleaning; Milk Storage  Plan: STS once able to Breast massage, hand expression and coconut oil prior to pumping Pump both breasts on initiate mode every 3 hours for 15 minutes, ideally 8 pumping sessions/24 hours Let LC know on discharge if she'll be requiring a [facility] loaner pump   No other support person at this time. All questions and concerns answered, family to contact [facility] services PRN.  Consult Status: NICU follow-up NICU Follow-up type: Maternal D/C visit   Joella Saefong [name] Crate 06/30/2024, 11:00 AM

## 2024-06-30 NOTE — [place] Notes (Signed)
 " MATCH Medication Assistance Card *Pharmacies please call [phone] for claim processing assistance Name: Pamela Alvarado                                                                                                                                                                                     Relationship Code:  1 ID (MRN): [id number]                                                                                                                                                                              Person Code:  01 Bin: [id number] RX Group: [id number] Discharge Date: 06/30/2024  RX PCN:  [id number] Expiration Date: 07/07/2024                                           (must be filled within 7 days of discharge)     You have been approved to have the prescriptions written by your discharging physician filled through our [facility] (Medication Assistance Through [facility]) program. This program allows for a one-time (no refills) 34-day supply of selected medications for a low copay amount.  The copay is $0 per prescription.   Only certain pharmacies are participating in this program with [facility]. You will need to select one of the pharmacies from the attached list and take your prescriptions, this letter, and your photo ID to one of  the [facility] pharmacies.  We are excited that you are able to use the [facility] program to get your medications. These prescriptions must be filled within 7 days of hospital discharge or they will no longer be valid for the [facility] program. Should you have any problems with your prescriptions please contact your case management team member at [phone]  for [place]/ [facility].  Thank you,   [facility] Care Management   "

## 2024-06-30 NOTE — [place] Notes (Signed)
 POSTPARTUM PROGRESS NOTE  POD #1  Subjective:  Sadee Osland is a 38 y.o. [phone] s/p pLTCS at [redacted]w[redacted]d. Today she notes she is doing well. She denies any problems with ambulating, voiding or po intake. Denies nausea or vomiting. She has passed flatus, no BM.  Pain is well controlled with current medication.  Lochia appropriate Denies fever/chills/chest pain/SOB.  no HA, no blurry vision, no RUQ pain  Objective: Blood pressure (!) 122/58, pulse (!) 54, temperature 98.2 F (36.8 C), temperature source Oral, resp. rate 18, height 4' 9 (1.448 m), weight 80.3 kg, last menstrual period 10/25/2023, SpO2 99%, unknown if currently breastfeeding.  Physical Exam:  General: alert, cooperative and no distress Chest: no respiratory distress Heart: regular rate and rhythm Abdomen: soft, nontender, +BS Uterine Fundus: firm, appropriately tender Incision: C/D/I with honeycomb DVT Evaluation: No calf swelling or tenderness Extremities: no edema Skin: warm, dry  Results for orders placed or performed during the hospital encounter of 06/29/24 (from the past 24 hours)  Type and screen [facility]     Status: None   Collection Time: 06/29/24 10:59 AM  Result Value Ref Range   ABO/RH(D) O POS    Antibody Screen NEG    Sample Expiration      07/02/2024,2359 Performed at [facility], [address], [place], [place] [address]   CBC     Status: Abnormal   Collection Time: 06/29/24 11:13 AM  Result Value Ref Range   WBC 13.0 (H) 4.0 - 10.5 K/uL   RBC 5.22 (H) 3.87 - 5.11 MIL/uL   Hemoglobin 15.6 (H) 12.0 - 15.0 g/dL   HCT 55.8 63.9 - 53.9 %   MCV 84.5 80.0 - 100.0 fL   MCH 29.9 26.0 - 34.0 pg   MCHC 35.4 30.0 - 36.0 g/dL   RDW 87.3 88.4 - 84.4 %   Platelets 317 150 - 400 K/uL   nRBC 0.0 0.0 - 0.2 %  Glucose, capillary     Status: Abnormal   Collection Time: 06/29/24 12:52 PM  Result Value Ref Range   Glucose-Capillary 124 (H) 70 - 99 mg/dL  Glucose,  capillary     Status: Abnormal   Collection Time: 06/29/24  3:24 PM  Result Value Ref Range   Glucose-Capillary 126 (H) 70 - 99 mg/dL  Glucose, capillary     Status: Abnormal   Collection Time: 06/29/24  7:31 PM  Result Value Ref Range   Glucose-Capillary 199 (H) 70 - 99 mg/dL  CBC     Status: Abnormal   Collection Time: 06/30/24  5:22 AM  Result Value Ref Range   WBC 15.9 (H) 4.0 - 10.5 K/uL   RBC 3.30 (L) 3.87 - 5.11 MIL/uL   Hemoglobin 10.0 (L) 12.0 - 15.0 g/dL   HCT 72.0 (L) 63.9 - 53.9 %   MCV 84.5 80.0 - 100.0 fL   MCH 30.3 26.0 - 34.0 pg   MCHC 35.8 30.0 - 36.0 g/dL   RDW 87.3 88.4 - 84.4 %   Platelets 244 150 - 400 K/uL   nRBC 0.0 0.0 - 0.2 %    Assessment/Plan: Torra Pala is a 38 y.o. [phone] s/p pLTCS of mono/di twins due to fetal malpresentation POD#1   1) Postop - Pain well-controlled - Hemoglobin stable status post C-section - Encourage ambulation as tolerated - Meeting milestones appropriately  2) Class B DM Currently on NPH 5 units twice daily and regular 5 units 3 times daily, will continue  to closely monitor  3) Gest HTN -BP low normal, no meds or Lasix  at this time, will continue to monitor  Contraception: considering Nexplanon Feeding: babies in NICU  Dispo: Continue routine postop care   LOS: 1 day   Delance Weide, DO Faculty Attending, Center for [facility] 06/30/2024, 7:15 AM

## 2024-06-30 NOTE — Lactation Note (Signed)
 This note was copied from a baby's chart.  NICU Lactation Consultation Note  Patient Name: Pamela Alvarado Today's Date: 06/30/2024 Age:36 hours  Reason for consult: Follow-up assessment; NICU baby; Infant < 5lbs; Preterm <34wks; Other (Comment); Maternal endocrine disorder; Multiple gestation (gHTN, AMA) Type of Endocrine Disorder?: Diabetes (DM2 (insulin ))  SUBJECTIVE Visited with family of 67 57/62 weeks old AGA NICU twin female Pamela Alvarado; Ms. Pamela Alvarado reported she's been pumping but still not getting any drops. Re-educated about the normalcy of this pattern at these # of hours post-partum and praised her for all her efforts. Baby Pamela Alvarado still on NPO status but NICU team is planning on starting feedings on the next touch time. Assisted with signing the donor milk consent form with NICU RN Pamela Alvarado. This LC also offered a [facility] loaner pump on discharge in case the [facility] office doesn't have appts available on her discharge day.   OBJECTIVE Infant data: Mother's Current Feeding Choice: -- (NPO)  O2 Device: CPAP FiO2 (%): 21 %  Maternal data: [id number] C-Section, Low Transverse Has patient been taught Hand Expression?: Yes Hand Expression Comments: no colostrum noted yet Significant Breast History:: (+) breast changes during the pregnancy Current breast feeding challenges:: NICU admission Does the patient have breastfeeding experience prior to this delivery?: Yes How long did the patient breastfeed?: 1st one 13 months, 2nd 2 months and 3rd one year Pumping frequency: 4 times/24 hours Pumped volume: 0 mL Flange Size: 21 Risk factor for low/delayed milk supply:: C/S, DM2, AMA, gHTN, prematurity, < 5 lbs, infant separation  WIC Program: Yes WIC Referral Sent?: Yes What county?: [place]  ASSESSMENT Infant: Feeding Status: NPO  Maternal: Milk volume: Normal  INTERVENTIONS/PLAN Interventions: Interventions: Breast feeding basics reviewed; Coconut oil; DEBP;  Education Tools: Pump; Flanges; Coconut oil Pump Education: Setup, frequency, and cleaning; Milk Storage  Plan: STS once able to Breast massage, hand expression and coconut oil prior to pumping Pump both breasts on initiate mode every 3 hours for 15 minutes, ideally 8 pumping sessions/24 hours Let LC know on discharge if she'll be requiring a [facility] loaner pump   No other support person at this time. All questions and concerns answered, family to contact [facility] services PRN.  Consult Status: NICU follow-up NICU Follow-up type: Maternal D/C visit   Pamela Alvarado 06/30/2024, 11:00 AM

## 2024-06-30 NOTE — Clinical Social Work Maternal (Addendum)
 " CLINICAL SOCIAL WORK MATERNAL/CHILD NOTE  Patient Details  Name: Pamela Alvarado MRN: [id number] Date of Birth: 08/09/1985  Date:  15-Jul-2023  Clinical Social Worker Initiating Note:  Nat Quiet, [place] Date/Time: Initiated:  06/30/24/1137     Child's Name:  Ezella A: [name] Liane Chandra Baby B: Ellis Liane Chandra   Biological Parents:  Mother, Father (Mother: Marca Chandra [place]te[place] 06/07/1988, Father: [name] Liane [facility] 06/06/1987.)   Need for Interpreter:  None   Reason for Referral:  Parental Support of Premature Babies < 32 weeks/or Critically Ill babies   Address:  [address] [place] [address]    Phone number:  [phone] (home) [phone] (work)    Additional phone number:   Household Members/Support Persons (HM/SP):   Household Member/Support Person 1, Household Member/Support Person 2, Household Member/Support Person 3, Household Member/Support Person 4   HM/SP Name Relationship DOB or Age  HM/SP -1 Marcos Liane Scheuermann Spouse 06/06/1987  HM/SP -2 Dallas Liane Chandra [place] 12/14/2007  HM/SP -3 Edwin Liane Chandra Son 02/02/2010  HM/SP -4 Emanuel Liane Chandra Gilmer 09/21/2015  HM/SP -5        HM/SP -6        HM/SP -7        HM/SP -8          Natural Supports (not living in the home):      Professional Supports: None   Employment: Unemployed   Type of Work:     Education:  Engineer, agricultural   Homebound arranged:    Surveyor, Quantity Resources:  Other (Comment), Self-Pay   (MOB desires to apply for Emergency Medicaid) -[employer]  Navigator Contacted.   Other Resources:      Cultural/Religious Considerations Which May Impact Care:    Strengths:  Ability to meet basic needs  , Pediatrician chosen   Psychotropic Medications:         Pediatrician:    [place] area  Pediatrician List:    [facility] ([address] [employer])  [place]    [place]      Pediatrician Fax Number:    Risk Factors/Current Problems:  Mental Health Concerns     Cognitive State:  Alert     Mood/Affect:  Calm  , Comfortable  , Interested     CSW Assessment: CSW received consult for hx of Anxiety and Depression.  CSW met with MOB to offer support and complete assessment.    In-person interpreter #Viria A.   CSW met with MOB at bedside room 3S13 and introduced CSW role. MOB presented calm and engaged with CSW during the visit. MOB confirmed that her demographic information on hospital file was correct. MOB reported that she lives with her spouse and three children (see chart above) and is currently unemployed. 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 MOB expressed interest in [facility] and gave CSW permission to make the referral to [facility]. MOB reported that she would be able to get all essential baby items to care for her infants including cribs and car seats. CSW inquired if MOB felt well informed about the care her infants have been receiving in the NICU. MOB reported that she felt well informed. CSW educated MOB about NICU support services and offered to check in with MOB while the infant remains in the NICU. MOB was receptive to visit from  CSW while the infants remain in the NICU. CSW acknowledged that MOB did not have a payor source on file. MOB expressed interest in applying for emergency Medicaid herself and infants. CSW agreed to notify [facility] financial navigator regarding MOB's request.   CSW asked MOB how she had been feeling. MOB stated, so far so good. CSW inquired about MOB's noted mental health history for anxiety and depression. MOB denied current concerns with anxiety and depression. She reported that she no longer takes Zoloft for her mental health. MOB did not present with any acute mental health concerns. CSW inquired if MOB previously experienced postpartum depression and anxiety. MOB reported that  she barely experienced postpartum depression and anxiety. CSW provided education regarding the baby blues period vs. perinatal mood disorders, discussed treatment and gave resources for mental health follow up if concerns arise.  CSW recommended MOB complete a self-evaluation during the postpartum time period using the New Mom Checklist from Postpartum [place] and encouraged MOB to contact a medical professional if symptoms are noted at any time. MOB reported that she felt comfortable reaching out to her provider if she had concerns. CSW assessed MOB for safety. MOB denied SI/HI and DV concerns.    CSW provided review of Sudden Infant Death Syndrome (SIDS) precautions. MOB verbalized understanding.   MOB reported her infants will see a pediatrician at [facility].   CSW Plan/Description: CSW will continue to offer support and resources to family while infant remains in NICU. Reviewed Perinatal Mood and Anxiety Disorder (PMADs) Education, Sudden Infant Death Syndrome (SIDS) Education, No Further Intervention Required/No Barriers to Discharge    Nat DELENA Quiet, LCSW Jun 06, 2024, 11:53 AM "

## 2024-06-30 NOTE — Inpatient Diabetes Management (Signed)
 Inpatient Diabetes Program Recommendations  AACE/ADA: New Consensus Statement on Inpatient Glycemic Control (2015)  Target Ranges:  Prepandial:   less than 140 mg/dL      Peak postprandial:   less than 180 mg/dL (1-2 hours)      Critically ill patients:  140 - 180 mg/dL   Lab Results  Component Value Date   GLUCAP 153 (H) 06/30/2024   HGBA1C 6.5 (H) 06/10/2024    Review of Glycemic Control  Latest Reference Range & Units 06/29/24 15:24 06/29/24 19:31 06/30/24 08:45  Glucose-Capillary 70 - 99 mg/dL 873 (H) 800 (H) 846 (H)  (H): Data is abnormally high Diabetes history: Type 2 DM Outpatient Diabetes medications: Humalog  14 units TID, Metformin  1000 mg BID, NPH 12 units BID [facility]- [facility] Basaglar  22 units every day, Metformin  1000 mg BID Current orders for Inpatient glycemic control: NPH 5 units BID, Metformin  1000 mg BID, Novolog  5 units TID BMZ x1  Decadron  10 mg x 1  Inpatient Diabetes Program Recommendations:    Could consider switching to Novolog  70/30 8 units BID.  Will place a TOC consult- patient has been seen by providers at [facility].   Thanks, Tinnie Minus, MSN, RNC-OB Diabetes Coordinator [phone] (8a-5p)

## 2024-06-30 NOTE — Plan of Care (Signed)
  Problem: Education: Goal: Knowledge of General Education information will improve Description: Including pain rating scale, medication(s)/side effects and non-pharmacologic comfort measures Outcome: Progressing   Problem: Health Behavior/Discharge Planning: Goal: Ability to manage health-related needs will improve Outcome: Progressing   Problem: Clinical Measurements: Goal: Ability to maintain clinical measurements within normal limits will improve Outcome: Progressing Goal: Will remain free from infection Outcome: Progressing Goal: Diagnostic test results will improve Outcome: Progressing Goal: Respiratory complications will improve Outcome: Progressing Goal: Cardiovascular complication will be avoided Outcome: Progressing   Problem: Activity: Goal: Risk for activity intolerance will decrease Outcome: Progressing   Problem: Nutrition: Goal: Adequate nutrition will be maintained Outcome: Progressing   Problem: Coping: Goal: Level of anxiety will decrease Outcome: Progressing   Problem: Elimination: Goal: Will not experience complications related to bowel motility Outcome: Progressing Goal: Will not experience complications related to urinary retention Outcome: Progressing   Problem: Pain Managment: Goal: General experience of comfort will improve and/or be controlled Outcome: Progressing   Problem: Safety: Goal: Ability to remain free from injury will improve Outcome: Progressing   Problem: Skin Integrity: Goal: Risk for impaired skin integrity will decrease Outcome: Progressing   Problem: Education: Goal: Knowledge of condition will improve Outcome: Progressing Goal: Individualized Educational Video(s) Outcome: Progressing Goal: Individualized Newborn Educational Video(s) Outcome: Progressing   Problem: Activity: Goal: Will verbalize the importance of balancing activity with adequate rest periods Outcome: Progressing Goal: Ability to tolerate increased  activity will improve Outcome: Progressing   Problem: Coping: Goal: Ability to identify and utilize available resources and services will improve Outcome: Progressing   Problem: Life Cycle: Goal: Chance of risk for complications during the postpartum period will decrease Outcome: Progressing   Problem: Role Relationship: Goal: Ability to demonstrate positive interaction with newborn will improve Outcome: Progressing   Problem: Skin Integrity: Goal: Demonstration of wound healing without infection will improve Outcome: Progressing   Problem: Education: Goal: Ability to describe self-care measures that may prevent or decrease complications (Diabetes Survival Skills Education) will improve Outcome: Progressing Goal: Individualized Educational Video(s) Outcome: Progressing   Problem: Coping: Goal: Ability to adjust to condition or change in health will improve Outcome: Progressing   Problem: Fluid Volume: Goal: Ability to maintain a balanced intake and output will improve Outcome: Progressing   Problem: Health Behavior/Discharge Planning: Goal: Ability to identify and utilize available resources and services will improve Outcome: Progressing Goal: Ability to manage health-related needs will improve Outcome: Progressing   Problem: Metabolic: Goal: Ability to maintain appropriate glucose levels will improve Outcome: Progressing   Problem: Nutritional: Goal: Maintenance of adequate nutrition will improve Outcome: Progressing Goal: Progress toward achieving an optimal weight will improve Outcome: Progressing   Problem: Skin Integrity: Goal: Risk for impaired skin integrity will decrease Outcome: Progressing   Problem: Tissue Perfusion: Goal: Adequacy of tissue perfusion will improve Outcome: Progressing   Problem: Education: Goal: Knowledge of the prescribed therapeutic regimen will improve Outcome: Progressing Goal: Understanding of sexual limitations or changes  related to disease process or condition will improve Outcome: Progressing Goal: Individualized Educational Video(s) Outcome: Progressing   Problem: Self-Concept: Goal: Communication of feelings regarding changes in body function or appearance will improve Outcome: Progressing   Problem: Skin Integrity: Goal: Demonstration of wound healing without infection will improve Outcome: Progressing

## 2024-07-01 ENCOUNTER — Other Ambulatory Visit: Payer: Self-pay | Admitting: Nurse Practitioner

## 2024-07-01 LAB — GLUCOSE, CAPILLARY
Glucose-Capillary: 162 mg/dL — ABNORMAL HIGH (ref 70–99)
Glucose-Capillary: 71 mg/dL (ref 70–99)
Glucose-Capillary: 86 mg/dL (ref 70–99)

## 2024-07-01 MED ORDER — ACETAMINOPHEN 500 MG PO TABS
1000.0000 mg | ORAL_TABLET | Freq: Four times a day (QID) | ORAL | Status: DC | PRN
  Administered 2024-07-01: 1000 mg via ORAL
  Filled 2024-07-01: qty 2

## 2024-07-01 NOTE — Lactation Note (Signed)
 This note was copied from a baby's chart.  NICU Lactation Consultation Note  Patient Name: Pamela Alvarado Date: 07/01/2024 Age:36 hours  Reason for consult: Follow-up assessment; Multiple gestation; Preterm <34wks; Infant < 5lbs; NICU baby; Other (Comment) (AMA, GHTN) Type of Endocrine Disorder?: Diabetes (T2DM on insulin )  SUBJECTIVE  LC in to visit with P4 Mom of preterm twins in the NICU.  Mom hasn't been consistently pumping so LC offered to assist Mom to pump using the pumping band to allow for hands on pumping.  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    Mom aware of [facility] loaner pump available at maternal discharge.  Discharge is expected tomorrow.  OBJECTIVE Infant data: Mother's Current Feeding Choice: Breast Milk and Donor Milk  O2 Device: Other (Comment) (NIPPV) FiO2 (%): 21 %  Infant feeding assessment IDFS - Readiness: 5 (NIPPV)   Maternal data: [id number] C-Section, Low Transverse Pumping frequency: not consistently, encouraged to pump every 3 hrs Pumped volume: 0 mL (drops) Flange Size: 21 Hands-free pumping top sizes: Large [name]  WIC Program: No WIC Referral Sent?: Yes What county?: [place]  ASSESSMENT Infant:  Feeding Status: Scheduled 8-11-2-5 Feeding method: Tube/Gavage (Bolus)  Maternal: Milk volume: Normal  INTERVENTIONS/PLAN Interventions: Interventions: Skin to skin; Breast massage; Hand express; Hand pump; Education Tools: Pump; Flanges; Hands-free pumping top Pump Education: Setup, frequency, and cleaning; Milk Storage  Plan: Consult Status: NICU follow-up NICU Follow-up type: Maternal D/C visit; Verify onset of copious milk; Verify absence of engorgement; Verify DEBP issuance   Pamela Alvarado 07/01/2024, 1:24 PM

## 2024-07-01 NOTE — [place] Notes (Signed)
 POSTPARTUM PROGRESS NOTE  Subjective: 38 y.o. H3E6874 who is POD2 s/p primary cesarean section for twins and preterm labor, fetal malpresentation Course complicated by: GHTN, DM2  Doing well this morning, reports pain that is controlled with current regimen. Mild bleeding. Walking, voiding. Passing gas but no BM yet.  Objective: Blood pressure (!) 98/52, pulse 66, temperature 98 F (36.7 C), temperature source Oral, resp. rate 19, height 4' 9 (1.448 m), weight 80.3 kg, last menstrual period 10/25/2023, SpO2 100%, unknown if currently breastfeeding.  Physical Exam:  General: alert, cooperative, and no distress Abdomen: soft, nontender nondistended Uterine Fundus: firm Lochia: appropriate Incision: clean/dry/intact with honeycomb dressing in place Lower extremities: 1+ lower extremity edema  Recent Labs    06/29/24 1113 06/30/24 0522  HGB 15.6* 10.0*  HCT 44.1 27.9*    162 High  138 High  CM 150 High  CM 153 High  CM   Assessment/Plan: Postpartum - Contraception: undecided - MOF: breast - Rh status: O POS  - Rubella status: immune - Consults: DM care team, SW for NICU babies  DM2: on novolog  8 u BID, still with elevated blood sugars - will discuss current regimen with diabetes nurse  3. GHTN: blood pressures low normal, no meds/lasix   4. Dispo: likely home tomorrow   LOS: 2 days    Rollo ONEIDA Bring, MD, FACOG Obstetrician & Gynecologist, [facility] for [facility], [facility] 07/01/2024, 9:02 AM

## 2024-07-02 ENCOUNTER — Other Ambulatory Visit (HOSPITAL_COMMUNITY): Payer: Self-pay

## 2024-07-02 ENCOUNTER — Other Ambulatory Visit: Payer: Self-pay

## 2024-07-02 LAB — GLUCOSE, CAPILLARY
Glucose-Capillary: 97 mg/dL (ref 70–99)
Glucose-Capillary: 88 mg/dL (ref 70–99)

## 2024-07-02 MED ORDER — FUROSEMIDE 20 MG PO TABS
20.0000 mg | ORAL_TABLET | Freq: Every day | ORAL | 0 refills | Status: DC
  Filled 2024-07-02: qty 5, 5d supply, fill #0

## 2024-07-02 MED ORDER — POTASSIUM CHLORIDE CRYS ER 20 MEQ PO TBCR
20.0000 meq | EXTENDED_RELEASE_TABLET | Freq: Every day | ORAL | 0 refills | Status: DC
  Filled 2024-07-02: qty 5, 5d supply, fill #0

## 2024-07-02 MED ORDER — IBUPROFEN 600 MG PO TABS
600.0000 mg | ORAL_TABLET | Freq: Four times a day (QID) | ORAL | 0 refills | Status: DC
  Filled 2024-07-02: qty 30, 8d supply, fill #0

## 2024-07-02 MED ORDER — INSULIN ASPART PROT & ASPART (70-30 MIX) 100 UNIT/ML PEN
8.0000 [IU] | PEN_INJECTOR | Freq: Two times a day (BID) | SUBCUTANEOUS | 11 refills | Status: DC
  Filled 2024-07-02: qty 3, 18d supply, fill #0

## 2024-07-02 MED ORDER — OXYCODONE-ACETAMINOPHEN 5-325 MG PO TABS
1.0000 | ORAL_TABLET | ORAL | 0 refills | Status: DC | PRN
  Filled 2024-07-02: qty 15, 3d supply, fill #0

## 2024-07-02 MED ORDER — METFORMIN HCL ER 500 MG PO TB24
1000.0000 mg | ORAL_TABLET | Freq: Two times a day (BID) | ORAL | 2 refills | Status: DC
  Filled 2024-07-02: qty 120, 30d supply, fill #0

## 2024-07-02 MED ORDER — DOCUSATE SODIUM 100 MG PO CAPS
100.0000 mg | ORAL_CAPSULE | Freq: Two times a day (BID) | ORAL | 0 refills | Status: DC
  Filled 2024-07-02: qty 10, 5d supply, fill #0

## 2024-07-02 NOTE — Plan of Care (Signed)
  Problem: Education: Goal: Knowledge of General Education information will improve Description: Including pain rating scale, medication(s)/side effects and non-pharmacologic comfort measures Outcome: Progressing   Problem: Health Behavior/Discharge Planning: Goal: Ability to manage health-related needs will improve Outcome: Progressing   Problem: Clinical Measurements: Goal: Ability to maintain clinical measurements within normal limits will improve Outcome: Progressing Goal: Will remain free from infection Outcome: Progressing Goal: Diagnostic test results will improve Outcome: Progressing Goal: Respiratory complications will improve Outcome: Progressing Goal: Cardiovascular complication will be avoided Outcome: Progressing   Problem: Activity: Goal: Risk for activity intolerance will decrease Outcome: Progressing   Problem: Nutrition: Goal: Adequate nutrition will be maintained Outcome: Progressing   Problem: Coping: Goal: Level of anxiety will decrease Outcome: Progressing   Problem: Elimination: Goal: Will not experience complications related to bowel motility Outcome: Progressing Goal: Will not experience complications related to urinary retention Outcome: Progressing   Problem: Pain Managment: Goal: General experience of comfort will improve and/or be controlled Outcome: Progressing   Problem: Safety: Goal: Ability to remain free from injury will improve Outcome: Progressing   Problem: Skin Integrity: Goal: Risk for impaired skin integrity will decrease Outcome: Progressing   Problem: Education: Goal: Knowledge of condition will improve Outcome: Progressing Goal: Individualized Educational Video(s) Outcome: Progressing Goal: Individualized Newborn Educational Video(s) Outcome: Progressing   Problem: Activity: Goal: Will verbalize the importance of balancing activity with adequate rest periods Outcome: Progressing Goal: Ability to tolerate increased  activity will improve Outcome: Progressing   Problem: Coping: Goal: Ability to identify and utilize available resources and services will improve Outcome: Progressing   Problem: Life Cycle: Goal: Chance of risk for complications during the postpartum period will decrease Outcome: Progressing   Problem: Role Relationship: Goal: Ability to demonstrate positive interaction with newborn will improve Outcome: Progressing   Problem: Skin Integrity: Goal: Demonstration of wound healing without infection will improve Outcome: Progressing   Problem: Education: Goal: Ability to describe self-care measures that may prevent or decrease complications (Diabetes Survival Skills Education) will improve Outcome: Progressing Goal: Individualized Educational Video(s) Outcome: Progressing   Problem: Coping: Goal: Ability to adjust to condition or change in health will improve Outcome: Progressing   Problem: Fluid Volume: Goal: Ability to maintain a balanced intake and output will improve Outcome: Progressing   Problem: Health Behavior/Discharge Planning: Goal: Ability to identify and utilize available resources and services will improve Outcome: Progressing Goal: Ability to manage health-related needs will improve Outcome: Progressing   Problem: Metabolic: Goal: Ability to maintain appropriate glucose levels will improve Outcome: Progressing   Problem: Nutritional: Goal: Maintenance of adequate nutrition will improve Outcome: Progressing Goal: Progress toward achieving an optimal weight will improve Outcome: Progressing   Problem: Skin Integrity: Goal: Risk for impaired skin integrity will decrease Outcome: Progressing   Problem: Tissue Perfusion: Goal: Adequacy of tissue perfusion will improve Outcome: Progressing   Problem: Education: Goal: Knowledge of the prescribed therapeutic regimen will improve Outcome: Progressing Goal: Understanding of sexual limitations or changes  related to disease process or condition will improve Outcome: Progressing Goal: Individualized Educational Video(s) Outcome: Progressing   Problem: Self-Concept: Goal: Communication of feelings regarding changes in body function or appearance will improve Outcome: Progressing   Problem: Skin Integrity: Goal: Demonstration of wound healing without infection will improve Outcome: Progressing

## 2024-07-03 NOTE — [place]ation Note (Signed)
 This note was copied from a baby's chart.  NICU Lactation Consultation Note  Patient Name: Pamela Alvarado Today's Date: 07/03/2024 Age:37 days  Reason for consult: Follow-up assessment; Maternal endocrine disorder; Multiple gestation; NICU baby; Other (Comment) (AMA, GHTN) Type of Endocrine Disorder?: Diabetes  SUBJECTIVE  Using Ipad Spanish interpreter after NNP consulted with parents and given update-  LC in to visit with P4 Mom of preterm twins in the NICU.  Mom was discharged from [facility] on 12/31 with a manual pump. LC provided a [facility] loaner today.  Loaner # [phone]  Mom aware of importance of consistent pumping, denies engorgement, expressing 30 ml per session and was instructed to use the maintain mode for 15-30 mins per pump.  Engorgement prevention and treatment reviewed.  LC reviewed importance of disassembling pump parts to wash, rinse and air dry.  LC washed the pump parts for Mom and provided a second set for her home pump.    OBJECTIVE Infant data: No data recorded O2 Device: Other (Comment) (NIPPV) FiO2 (%): 21 %  Infant feeding assessment IDFS - Readiness: 5 (NIPPV)   Maternal data: [id number] C-Section, Low Transverse Pumping frequency: 8 times per 24 hrs Pumped volume: 30 mL Flange Size: 21 Hands-free pumping top sizes: Large Pamela Alvarado)  WIC Program: No WIC Referral Sent?: Yes What county?: [place] Pump: WIC Loaner  ASSESSMENT Infant:  Feeding Status: Scheduled 8-11-2-5 Feeding method: Tube/Gavage (Bolus)  Maternal: Milk volume: Normal  INTERVENTIONS/PLAN Interventions: Interventions: Skin to skin; Breast massage; Hand express; DEBP Discharge Education: Engorgement and breast care Tools: Pump; Flanges; Hands-free pumping top Pump Education: Setup, frequency, and cleaning; Milk Storage  Plan: Consult Status: NICU follow-up NICU Follow-up type: Verify DEBP issuance; Verify absence of engorgement; Verify onset of copious milk   Pamela Alvarado 07/03/2024, 10:08 AM

## 2024-07-04 ENCOUNTER — Other Ambulatory Visit: Payer: Self-pay

## 2024-07-05 LAB — SURGICAL PATHOLOGY

## 2024-07-08 ENCOUNTER — Encounter: Payer: Self-pay | Admitting: Obstetrics and Gynecology

## 2024-07-08 ENCOUNTER — Other Ambulatory Visit: Payer: Self-pay

## 2024-07-08 ENCOUNTER — Ambulatory Visit: Payer: Self-pay

## 2024-07-08 NOTE — Lactation Note (Signed)
 This note was copied from a baby's chart.  NICU Lactation Consultation Note  Patient Name: Pamela Alvarado Sixtega Today's Date: 07/08/2024 Age:37 days  Reason for consult: Weekly NICU follow-up; Maternal endocrine disorder; NICU baby; Multiple gestation; Other (Comment); Infant < 5lbs; Preterm <34wks (AMA, gHTN) Type of Endocrine Disorder?: Diabetes (DM2 (insulin ))  SUBJECTIVE Visited with family of 64 19/74 weeks old AGA NICU twin female [name]; [name] is a P4 and reported she's been pumping consistently, her supply continues to increase, praised her for all her efforts. She voiced that all the pumping is helping with her inverted nipples, they are finally coming out. Parents inquired about breastfeeding; she had a Hx of nipple trauma with her other babies due to nipple anatomy. Let her know that by the time babies are ready to go to breast we will re-assess and that there are tools that we can utilize in case we need them such as nipple shields.   So far, pumping sessions are comfortable, sometimes she has to use the hand pump if the DEBP does not take care of all the knots on her breasts. Provided another pumping band in size L Brown for hands on pumping and asked her to start icing her breast if they still feel knotty after 30 minutes of pumping. She hasn't picked up her DEBP from the [facility] office, her appt is not until 07/10/2024.  OBJECTIVE Infant data: Mother's Current Feeding Choice: Breast Milk and Donor Milk  O2 Device: CPAP FiO2 (%): 21 %  Maternal data: [id number] C-Section, Low Transverse Pumping frequency: 6-7 times/24 hours Pumped volume: 90 mL (90-120 ml) Hands-free pumping top sizes: Large [name]  WIC Program: No WIC Referral Sent?: Yes What county?: [place] Pump: WIC Loaner  ASSESSMENT Infant: Feeding Status: Scheduled 8-11-2-5 Feeding method: Tube/Gavage (Bolus)  Maternal: Milk volume:  Normal  INTERVENTIONS/PLAN Interventions: Interventions: Breast feeding basics reviewed; Coconut oil; DEBP; Education Discharge Education: Engorgement and breast care Tools: Hands-free pumping top  Plan: STS around care times Pump both breasts on maintain mode every 3 hours for 20-30 minutes, ideally 8 pumping sessions/24 hours Ice her breasts PRN Verify pump issuance with the [facility] office   No other support person at this time. All questions and concerns answered, family to contact [facility] services PRN.  Consult Status: NICU follow-up NICU Follow-up type: Verify DEBP issuance; Weekly NICU follow up   Issabelle Mcraney [name] 07/08/2024, 3:50 PM

## 2024-07-08 NOTE — Lactation Note (Signed)
 This note was copied from a baby'Pamela Alvarado chart.  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 She voiced that all the pumping is helping with her inverted nipples, they are finally coming out. Parents inquired about breastfeeding; she had a Hx of nipple trauma with her other babies due to nipple anatomy. Let her know that by the time babies are ready to go to breast we will re-assess and that there are tools that we can utilize in case we need them such as nipple shields.   So far, pumping sessions are comfortable, sometimes she has to use the hand pump if the DEBP does not take care of all the knots on her breasts. Provided another pumping band in size L Brown for hands on pumping and asked her to start icing her breast if they still feel knotty after 30 minutes of pumping. She hasn't picked up her DEBP from the [facility] office, her appt is not until 07/10/2024.  OBJECTIVE Infant data: Mother'Pamela Alvarado Current Feeding Choice: Breast Milk and Donor Milk  O2 Device: Si-PAP FiO2 (%): 21 %  Maternal data: [id number] C-Section, Low Transverse Pumping frequency: 6-7 times/24 hours Pumped volume: 90 mL (90-120 ml) Hands-free pumping top sizes: Large [name]  WIC Program: No WIC Referral Sent?: Yes What county?: [place] Pump: WIC Loaner  ASSESSMENT Infant: Feeding Status: Scheduled 9-12-3-6 Feeding method: Tube/Gavage (Bolus)  Maternal: Milk volume:  Normal  INTERVENTIONS/PLAN Interventions: Interventions: Breast feeding basics reviewed; Coconut oil; DEBP; Education Discharge Education: Engorgement and breast care Tools: Hands-free pumping top  Plan: STS around care times Pump both breasts on maintain mode every 3 hours for 20-30 minutes, ideally 8 pumping sessions/24 hours Ice her breasts PRN Verify pump issuance with the [facility] office   No other support person at this time. All questions and concerns answered, family to contact [facility] services PRN.  Consult Status: NICU follow-up NICU Follow-up type: Verify DEBP issuance; Weekly NICU follow up   Pamela Alvarado Pamela Alvarado Pamela Alvarado 07/08/2024, 3:50 PM

## 2024-07-09 ENCOUNTER — Other Ambulatory Visit: Payer: Self-pay

## 2024-07-09 ENCOUNTER — Ambulatory Visit: Payer: Self-pay | Admitting: *Deleted

## 2024-07-09 VITALS — BP 120/72 | HR 74 | Ht <= 58 in | Wt 159.0 lb

## 2024-07-09 DIAGNOSIS — Z4889 Encounter for other specified surgical aftercare: Secondary | ICD-10-CM

## 2024-07-09 DIAGNOSIS — Z0130 Encounter for examination of blood pressure without abnormal findings: Secondary | ICD-10-CM

## 2024-07-09 NOTE — Progress Notes (Signed)
 Subjective:  Pamela Alvarado is a H3E6874 here for BP check and incision check .  She is 1 week  and 3 days postpartum following a c/s for twins at 31 weeks due to PTL , breech. She has GHTN.    Hypertension ROS: Patient denies visual changes, headaches.  Incision:   Incision covered by honeycomb dressing . Dressing removed without issue. Incision CDI.   Objective:  Wt 159 lb (72.1 kg)   LMP 10/25/2023 (Approximate)   BMI 34.41 kg/m   Appearance alert, well appearing, and in no distress and oriented to person, place, and time.  Assessment:   Blood Pressure well controlled. Not on meds.   Plan:  Current treatment plan is effective, no change in therapy.. Reviewed wound care. Reviewed symptoms of postpartum pre-eclampsia. Reviewed postpartum appointment.  She voices understanding.   Rock Skip PEAK

## 2024-07-15 ENCOUNTER — Other Ambulatory Visit: Payer: Self-pay | Admitting: Internal Medicine

## 2024-07-15 ENCOUNTER — Other Ambulatory Visit: Payer: Self-pay | Admitting: Family Medicine

## 2024-07-15 DIAGNOSIS — E1165 Type 2 diabetes mellitus with hyperglycemia: Secondary | ICD-10-CM

## 2024-07-18 ENCOUNTER — Other Ambulatory Visit (HOSPITAL_COMMUNITY): Payer: Self-pay

## 2024-07-18 ENCOUNTER — Other Ambulatory Visit: Payer: Self-pay

## 2024-07-18 ENCOUNTER — Other Ambulatory Visit: Payer: Self-pay | Admitting: Family Medicine

## 2024-07-18 DIAGNOSIS — E119 Type 2 diabetes mellitus without complications: Secondary | ICD-10-CM

## 2024-07-22 ENCOUNTER — Encounter: Payer: Self-pay | Admitting: Obstetrics and Gynecology

## 2024-07-22 ENCOUNTER — Other Ambulatory Visit: Payer: Self-pay

## 2024-07-22 ENCOUNTER — Encounter: Payer: Self-pay | Admitting: Pharmacy Technician

## 2024-07-29 NOTE — Lactation Note (Addendum)
 This note was copied from a baby's chart.  NICU Lactation Consultation Note  Patient Name: Pamela Alvarado Unijb'd Date: 07/29/2024 Age:37 wk.o.  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 LC in the room but parents have not come in for the day yet. Spoke to [name] over the phone and she voiced that her supply has plummeted, she's now pumping every 4-8 hours to build it up since she gets more the longer she waits. Explained how supply and demands work and encouraged her to start pumping like in the beginning every 2-3 hours to keep the milk coming. Let her know that her body will still need 5-7 days to see the results of these interventions and that the first few days she might actually get less per pumping sessions but the overall 24 hour count will be more.   She has already picked up her pump from the [facility] office and returned the loaner pump to the hospital. Reviewed strategies to increase supply such as frequent and consistent pumping, power pumping, STS care, and using a hospital grade pump. She also inquired about taking babies to breast as her goal is to breastfeed. Asked her to call for assistance the next time she comes to the hospital. Her main concern was to protect her supply as she doesn't want her milk to be gone when babies are ready to go to breast.   OBJECTIVE Infant data: Mother's Current Feeding Choice: Breast Milk and Donor Milk  O2 Device: HHFNC O2 Flow Rate (L/min): 1 L/min FiO2 (%): 21 %  Infant feeding assessment IDFS - Readiness: 2   Maternal data: [id number] C-Section, Low  Transverse Pumping frequency: 3-6 times/24 hours Pumped volume: 30 mL  WIC Program: No WIC Referral Sent?: Yes What county?: [place] Pump: WIC Pump  ASSESSMENT Infant: Feeding Status: Scheduled 9-12-3-6 Feeding method: Tube/Gavage (Bolus)  Maternal: Milk volume: Low  INTERVENTIONS/PLAN Interventions: Interventions: Breast feeding basics reviewed; Coconut oil; DEBP; Education  Plan: STS around care times, pump right after Pump both breasts on maintain mode every 3 hours for 20-30 minutes, ideally 8 pumping sessions/24 hours Power pump once daily Call out for assistance when babies are ready to go to breast  All questions and concerns answered, family to contact [facility] services PRN.  Consult Status: NICU follow-up NICU Follow-up type: Weekly NICU follow up; Assist with IDF-1 (Mother to pre-pump before breastfeeding)   Cintya Daughety [name] Crate 07/29/2024, 6:10 PM

## 2024-07-29 NOTE — Lactation Note (Signed)
 This note was copied from a baby's chart.  NICU Lactation Consultation Note  Patient Name: Pamela Alvarado Today's Date: 07/29/2024 Age:37 wk.o.  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 LC in the room but parents have not come in for the day yet. Spoke to Pamela Alvarado over the phone and she voiced that her supply has plummeted, she's now pumping every 4-8 hours to build it up since she gets more the longer she waits. Explained how supply and demands work and encouraged her to start pumping like in the beginning every 2-3 hours to keep the milk coming. Let her know that her body will still need 5-7 days to see the results of these interventions and that the first few days she might actually get less per pumping sessions but the overall 24 hour count will be more.   She has already picked up her pump from the [facility] office and returned the loaner pump to the hospital. Reviewed strategies to increase supply such as frequent and consistent pumping, power pumping, STS care, and using a hospital grade pump. She also inquired about taking babies to breast as her goal is to breastfeed. Asked her to call for assistance the next time she comes to the hospital. Her main concern was to protect her supply as she doesn't want her milk to be gone when babies are ready to go to breast.   OBJECTIVE Infant data: Mother's Current Feeding Choice: Breast Milk and Donor Milk  O2 Device: HHFNC O2 Flow Rate (L/min): 1 L/min FiO2 (%): 21 %  Infant feeding assessment IDFS - Readiness: 3   Maternal data: [id number] C-Section, Low  Transverse Pumping frequency: 3-6 times/24 hours Pumped volume: 30 mL  WIC Program: No WIC Referral Sent?: Yes What county?: [place] Pump: WIC Pump  ASSESSMENT Infant: Feeding Status: Scheduled 8-11-2-5 Feeding method: Tube/Gavage (Bolus)  Maternal: Milk volume: Normal  INTERVENTIONS/PLAN Interventions: Interventions: Breast feeding basics reviewed; Coconut oil; DEBP; Education  Plan: STS around care times, pump right after Pump both breasts on maintain mode every 3 hours for 20-30 minutes, ideally 8 pumping sessions/24 hours Power pump once daily Call out for assistance when babies are ready to go to breast  All questions and concerns answered, family to contact [facility] services PRN.   Consult Status: NICU follow-up NICU Follow-up type: Weekly NICU follow up; Assist with IDF-1 (Mother to pre-pump before breastfeeding)   Biana Haggar [name] Crate 07/29/2024, 6:10 PM

## 2024-07-30 ENCOUNTER — Other Ambulatory Visit: Payer: Self-pay

## 2024-07-30 ENCOUNTER — Ambulatory Visit: Payer: Self-pay | Admitting: Family Medicine

## 2024-07-30 ENCOUNTER — Encounter: Payer: Self-pay | Admitting: Family Medicine

## 2024-07-30 VITALS — BP 130/86 | HR 73 | Ht <= 58 in | Wt 163.0 lb

## 2024-07-30 DIAGNOSIS — Z758 Other problems related to medical facilities and other health care: Secondary | ICD-10-CM

## 2024-07-30 DIAGNOSIS — E119 Type 2 diabetes mellitus without complications: Secondary | ICD-10-CM

## 2024-07-30 DIAGNOSIS — O24113 Pre-existing diabetes mellitus, type 2, in pregnancy, third trimester: Secondary | ICD-10-CM

## 2024-07-30 DIAGNOSIS — E1165 Type 2 diabetes mellitus with hyperglycemia: Secondary | ICD-10-CM

## 2024-07-30 DIAGNOSIS — Z603 Acculturation difficulty: Secondary | ICD-10-CM

## 2024-07-30 DIAGNOSIS — Z794 Long term (current) use of insulin: Secondary | ICD-10-CM

## 2024-07-30 MED ORDER — TRUE METRIX BLOOD GLUCOSE TEST VI STRP
ORAL_STRIP | 0 refills | Status: AC
  Filled 2024-07-30: qty 50, 30d supply, fill #0
  Filled 2024-07-30: qty 100, 90d supply, fill #0

## 2024-07-30 MED ORDER — INSULIN ASPART PROT & ASPART (70-30 MIX) 100 UNIT/ML PEN
8.0000 [IU] | PEN_INJECTOR | Freq: Two times a day (BID) | SUBCUTANEOUS | 0 refills | Status: AC
  Filled 2024-07-30: qty 6, 38d supply, fill #0
  Filled 2024-07-30: qty 3, 19d supply, fill #0

## 2024-07-30 MED ORDER — METFORMIN HCL ER 500 MG PO TB24
1000.0000 mg | ORAL_TABLET | Freq: Two times a day (BID) | ORAL | 2 refills | Status: AC
  Filled 2024-07-30 (×2): qty 120, 30d supply, fill #0

## 2024-07-30 MED ORDER — PEN NEEDLES 31G X 5 MM MISC
1.0000 | 0 refills | Status: AC | PRN
  Filled 2024-07-30: qty 100, 25d supply, fill #0
  Filled 2024-07-30: qty 100, 30d supply, fill #0

## 2024-07-30 MED ORDER — TRUEPLUS LANCETS 28G MISC
0 refills | Status: AC
  Filled 2024-07-30: qty 100, 30d supply, fill #0
  Filled 2024-07-30: qty 100, 90d supply, fill #0

## 2024-07-30 MED ORDER — "INSULIN SYRINGE 31G X 5/16"" 0.3 ML MISC"
1.0000 | 0 refills | Status: AC | PRN
  Filled 2024-07-30: qty 100, 50d supply, fill #0
  Filled 2024-07-30: qty 100, 30d supply, fill #0

## 2024-07-30 NOTE — Progress Notes (Signed)
" ° ° °  Post Partum Visit Note  Pamela Alvarado is a 39 y.o. H3E6874 female who presents for a postpartum visit. She is 4 weeks postpartum following a primary cesarean section.  I have fully reviewed the prenatal and intrapartum course. The delivery was at 31 gestational weeks.  Anesthesia: spinal. Postpartum course has been uncomplicated. Babies are doing well still in the NICU. Babies are feeding by pumped breast milk & formula in NICU. Bleeding no bleeding. Bowel function is normal. Bladder function is normal. Patient is not sexually active. Contraception method is none. Patient is interested in Nexplanon. Postpartum depression screening: negative.   The pregnancy intention screening data noted above was reviewed. Potential methods of contraception were discussed. The patient elected to proceed with Hormonal Implant.    Health Maintenance Due  Topic Date Due   OPHTHALMOLOGY EXAM  Never done   Diabetic kidney evaluation - Urine ACR  Never done   Hepatitis B Vaccines 19-59 Average Risk (1 of 3 - 19+ 3-dose series) Never done   Cervical Cancer Screening (HPV/Pap Cotest)  Never done   FOOT EXAM  07/19/2022   COVID-19 Vaccine (3 - 2025-26 season) 03/03/2024    The following portions of the patient's history were reviewed and updated as appropriate: allergies, current medications, past family history, past medical history, past social history, past surgical history, and problem list.  Review of Systems Pertinent items are noted in HPI.  Objective:  LMP 10/25/2023 (Approximate)    General:  alert, cooperative, and appears stated age   Breasts:  not indicated  Lungs: Normal work of breathing, speaking full sentences  Heart:  Regular rate  Abdomen: soft, non-tender; bowel sounds normal; no masses,  no organomegaly   Wound well approximated incision  GU exam:  not indicated       Assessment:    There are no diagnoses linked to this encounter.  Normal postpartum exam.    Plan:   Essential components of care per ACOG recommendations:  1.  Mood and well being: Patient with negative depression screening today. Reviewed local resources for support.  - Patient tobacco use? No.   - hx of drug use? No.    2. Infant care and feeding:  -Patient currently breastmilk feeding? Yes. Discussed returning to work and pumping.  -Social determinants of health (SDOH) reviewed in EPIC. No concerns  3. Sexuality, contraception and birth spacing - Patient does not want a pregnancy in the next year.  Desired family size is 6 children.  - Reviewed reproductive life planning. Reviewed contraceptive methods based on pt preferences and effectiveness.  Patient still undecided, declines birth control today - Discussed birth spacing of 18 months  4. Sleep and fatigue -Encouraged family/partner/community support of 4 hrs of uninterrupted sleep to help with mood and fatigue  5. Physical Recovery  - Discussed patients delivery and complications.  - Patient had a C-section. Perineal healing reviewed. Patient expressed understanding - Patient has urinary incontinence? No. - Patient is safe to resume physical and sexual activity  6.  Health Maintenance - HM due items addressed Yes - Last pap smear No results found for: DIAGPAP Pap smear not done at today's visit.  Patient needs [facility] CCP for Pap smear -Breast Cancer screening indicated? No.   7. Chronic Disease/Pregnancy Condition follow up: Diabetes  - PCP follow up   Center for [employer], [facility]  "

## 2024-07-31 ENCOUNTER — Other Ambulatory Visit: Payer: Self-pay

## 2024-08-01 ENCOUNTER — Other Ambulatory Visit: Payer: Self-pay

## 2024-08-02 NOTE — Lactation Note (Signed)
 This note was copied from a baby's chart.  NICU Lactation Consultation Note  Patient Name: Pamela Alvarado Today's Date: 08/02/2024 Age:37 wk.o.  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 Re-educated about the importance of consistent and frequent pumping to keep prolactin levels (milk making hormone) high and delayed the return of her menses. Let her know that this is critical as she's approaching the 6-week mark. She understands that once her menses return her supply will be even more decreased but that consistent and frequent pumping should aid with this process.   She had questions regarding taking babies to breast; she's tried before but they quite don't latch yet. She was concerned about her nipples (see maternal assessment). Babies still on O2 support and tachypneic per RN; asked her to call for assistance once babies are stable enough to go to breast to assess the use of a nipple shield, she'll need a # 20. Pre-pumping will be needed for nipple eversion and due to babies being on full gavage feedings.   OBJECTIVE Infant data: Mother's Current Feeding Choice: Breast Milk and Formula  O2 Device: HHFNC O2 Flow Rate (L/min): 1 L/min FiO2 (%): 21 %  Infant feeding assessment IDFS - Readiness: 4 (sleeping)   Maternal data: [id number] C-Section, Low Transverse Pumping frequency: 5 times/24 hours Pumped volume: 30 mL  WIC Program: No WIC Referral Sent?: Yes What  county?: [place] Pump: WIC Pump  ASSESSMENT Infant: Feeding Status: Scheduled 9-12-3-6 Feeding method: Tube/Gavage (Bolus)  Maternal: Milk volume: Low  INTERVENTIONS/PLAN Interventions: Interventions: Breast feeding basics reviewed; Coconut oil; DEBP; Education  Plan: STS around care times, pump right after Pump both breasts on maintain mode every 3 hours for 20-30 minutes, ideally 8 pumping sessions/24 hours Power pump once daily Call out for assistance when babies are ready to go to breast   All questions and concerns answered, family to contact [facility] services PRN.  Consult Status: NICU follow-up NICU Follow-up type: Assist with IDF-1 (Mother to pre-pump before breastfeeding); Weekly NICU follow up   Stonewall Doss [name] Crate 08/02/2024, 2:52 PM

## 2024-08-02 NOTE — Lactation Note (Signed)
 This note was copied from a baby's chart.  NICU Lactation Consultation Note  Patient Name: Pamela Alvarado Today's Date: 08/02/2024 Age:37 wk.o.  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 Pamela Alvarado is a P1 and reported she's been trying pumping more often her supply per pumping session remains the same but since the frequency has increased so her 24 hour overall count, praised her for all her efforts. Re-educated about the importance of consistent and frequent pumping to keep prolactin levels (milk making hormone) high and delayed the return of her menses. Let her know that this is critical as she's approaching the 6-week mark. She understands that once her menses return her supply will be even more decreased but that consistent and frequent pumping should aid with this process.   She had questions regarding taking babies to breast; she's tried before but they quite don't latch yet. She was concerned about her nipples (see maternal assessment). Babies still on O2 support and tachypneic per RN; asked her to call for assistance once babies are stable enough to go to breast to assess the use of a nipple shield, she'll need a # 20. Pre-pumping will be needed for nipple eversion and due to babies being on full gavage feedings.   OBJECTIVE Infant data: Mother's Current Feeding Choice: Breast Milk and Formula  O2 Device: HHFNC O2 Flow Rate (L/min): 1 L/min FiO2 (%): 21 %  Infant feeding assessment IDFS - Readiness: 4 (sleeping)   Maternal data: [id number] C-Section, Low Transverse Pumping frequency: 5 times/24 hours Pumped volume: 30 mL  WIC Program: No WIC Referral Sent?:  Yes What county?: [place] Pump: WIC Pump  ASSESSMENT Infant: Feeding Status: Scheduled 8-11-2-5 Feeding method: Tube/Gavage (Bolus)  Maternal: Milk volume: Low L nipple is inverted R nipple is short shafted with an innie  INTERVENTIONS/PLAN Interventions: Interventions: Breast feeding basics reviewed; Coconut oil; DEBP; Education  Plan: STS around care times, pump right after Pump both breasts on maintain mode every 3 hours for 20-30 minutes, ideally 8 pumping sessions/24 hours Power pump once daily Call out for assistance when babies are ready to go to breast   All questions and concerns answered, family to contact [facility] services PRN.  Consult Status: NICU follow-up NICU Follow-up type: Weekly NICU follow up; Assist with IDF-1 (Mother to pre-pump before breastfeeding)   Pamela Alvarado Pamela Alvarado 08/02/2024, 2:52 PM

## 2024-08-04 ENCOUNTER — Other Ambulatory Visit: Payer: Self-pay

## 2024-08-05 ENCOUNTER — Other Ambulatory Visit: Payer: Self-pay

## 2024-08-05 ENCOUNTER — Encounter: Payer: Self-pay | Admitting: Obstetrics and Gynecology

## 2024-08-05 NOTE — Lactation Note (Signed)
 This note was copied from a baby's chart.  NICU Lactation Consultation Note  Patient Name: Pamela Alvarado Today's Date: 08/05/2024 Age:37 wk.o.  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 Pamela Alvarado already had baby Pamela Alvarado at the breast but he kept slipping off the nipple. Tried a NS # 20 due to inverted nipples and he was able to sustain the latch, baby awake, alert and cueing, but got tired after 10 minutes of suckling on and off and fell asleep at the breast. No adverse events to report during this feeding (see LATCH score). Noted some traces of EBM inside of NS # 20. MOB voiced she latches much better with it than without it.   She has tried latching the other twin to breast but still not able to, whenever she tried to put the nipple in his mouth he won't show interest. Provided coconut oil per her request, she voiced her R nipple is raw because she ran out. Asked her to call for assistance when needed.   OBJECTIVE Infant data: Mother's Current Feeding Choice: Breast Milk and Formula  O2 Device: Room Air O2 Flow Rate (L/min): 1 L/min FiO2 (%): 21 %  Infant feeding assessment IDFS - Readiness: 3   Maternal data: [id number] C-Section, Low Transverse Pumping frequency: 5 times/24 hours Pumped volume: 30 mL (up to 60 ml in the AM)  WIC Program: No WIC Referral Sent?: Yes What county?: [place] Pump: WIC Pump  ASSESSMENT Infant: Latch: Repeated attempts needed to sustain latch, nipple held in mouth throughout feeding, stimulation needed to elicit sucking reflex. Audible Swallowing:  None Type of Nipple: Inverted Comfort (Breast/Nipple): Soft / non-tender Hold (Positioning): No assistance needed to correctly position infant at breast. LATCH Score: 5  Feeding Status: Scheduled 9-12-3-6 Feeding method: Breast  Maternal: Milk volume: Low  INTERVENTIONS/PLAN Interventions: Interventions: Breast feeding basics reviewed; Assisted with latch; Breast compression; Adjust position; Support pillows; Coconut oil; DEBP; Education Tools: Nipple Shields Nipple shield size: 20  Plan: STS around care times, pump right after Pump both breasts on maintain mode every 3 hours for 20-30 minutes, ideally 8 pumping sessions/24 hours Offer the breast on feeding cues around feeding times using NS # 20    No other support person at this time. All questions and concerns answered, family to contact [facility] services PRN.   Consult Status: NICU follow-up NICU Follow-up type: Weekly NICU follow up; Assist with IDF-1 (Mother to pre-pump before breastfeeding) (Full gavage but no pre-pumping required)   Pamela Alvarado 08/05/2024, 4:04 PM
